# Patient Record
Sex: Female | Born: 1945 | Race: White | Hispanic: No | Marital: Married | Smoking: Never smoker
Health system: Southern US, Community
[De-identification: ages and names within clinical notes are randomized; demographics above are authoritative.]

## PROBLEM LIST (undated history)

## (undated) DIAGNOSIS — I493 Ventricular premature depolarization: Secondary | ICD-10-CM

## (undated) DIAGNOSIS — M1990 Unspecified osteoarthritis, unspecified site: Secondary | ICD-10-CM

## (undated) DIAGNOSIS — E785 Hyperlipidemia, unspecified: Secondary | ICD-10-CM

## (undated) DIAGNOSIS — I10 Essential (primary) hypertension: Secondary | ICD-10-CM

## (undated) DIAGNOSIS — K222 Esophageal obstruction: Secondary | ICD-10-CM

## (undated) DIAGNOSIS — I471 Supraventricular tachycardia, unspecified: Secondary | ICD-10-CM

## (undated) DIAGNOSIS — K5730 Diverticulosis of large intestine without perforation or abscess without bleeding: Secondary | ICD-10-CM

## (undated) DIAGNOSIS — C50919 Malignant neoplasm of unspecified site of unspecified female breast: Secondary | ICD-10-CM

## (undated) DIAGNOSIS — K219 Gastro-esophageal reflux disease without esophagitis: Secondary | ICD-10-CM

## (undated) DIAGNOSIS — K635 Polyp of colon: Secondary | ICD-10-CM

## (undated) DIAGNOSIS — Q159 Congenital malformation of eye, unspecified: Secondary | ICD-10-CM

## (undated) DIAGNOSIS — Z8719 Personal history of other diseases of the digestive system: Secondary | ICD-10-CM

## (undated) DIAGNOSIS — IMO0001 Reserved for inherently not codable concepts without codable children: Secondary | ICD-10-CM

## (undated) HISTORY — DX: Supraventricular tachycardia: I47.1

## (undated) HISTORY — DX: Diverticulosis of large intestine without perforation or abscess without bleeding: K57.30

## (undated) HISTORY — PX: COLONOSCOPY: SHX174

## (undated) HISTORY — PX: ANKLE SURGERY: SHX546

## (undated) HISTORY — DX: Personal history of other diseases of the digestive system: Z87.19

## (undated) HISTORY — DX: Congenital malformation of eye, unspecified: Q15.9

## (undated) HISTORY — DX: Hyperlipidemia, unspecified: E78.5

## (undated) HISTORY — DX: Unspecified osteoarthritis, unspecified site: M19.90

## (undated) HISTORY — DX: Esophageal obstruction: K22.2

## (undated) HISTORY — DX: Polyp of colon: K63.5

## (undated) HISTORY — DX: Ventricular premature depolarization: I49.3

## (undated) HISTORY — PX: UPPER GASTROINTESTINAL ENDOSCOPY: SHX188

## (undated) HISTORY — PX: TONSILLECTOMY: SUR1361

## (undated) HISTORY — DX: Reserved for inherently not codable concepts without codable children: IMO0001

## (undated) HISTORY — DX: Supraventricular tachycardia, unspecified: I47.10

## (undated) HISTORY — DX: Essential (primary) hypertension: I10

## (undated) HISTORY — PX: OTHER SURGICAL HISTORY: SHX169

## (undated) HISTORY — PX: KNEE ARTHROSCOPY: SUR90

## (undated) HISTORY — DX: Malignant neoplasm of unspecified site of unspecified female breast: C50.919

## (undated) HISTORY — DX: Gastro-esophageal reflux disease without esophagitis: K21.9

---

## 1997-05-27 DIAGNOSIS — E785 Hyperlipidemia, unspecified: Secondary | ICD-10-CM

## 1997-05-27 HISTORY — DX: Hyperlipidemia, unspecified: E78.5

## 1997-08-15 ENCOUNTER — Ambulatory Visit (HOSPITAL_COMMUNITY): Admission: RE | Admit: 1997-08-15 | Discharge: 1997-08-15 | Payer: Self-pay | Admitting: Obstetrics and Gynecology

## 1998-05-27 DIAGNOSIS — IMO0001 Reserved for inherently not codable concepts without codable children: Secondary | ICD-10-CM

## 1998-05-27 HISTORY — DX: Reserved for inherently not codable concepts without codable children: IMO0001

## 1998-08-28 ENCOUNTER — Encounter: Payer: Self-pay | Admitting: Obstetrics and Gynecology

## 1998-08-28 ENCOUNTER — Ambulatory Visit (HOSPITAL_COMMUNITY): Admission: RE | Admit: 1998-08-28 | Discharge: 1998-08-28 | Payer: Self-pay | Admitting: Obstetrics and Gynecology

## 1998-12-15 ENCOUNTER — Ambulatory Visit (HOSPITAL_COMMUNITY): Admission: RE | Admit: 1998-12-15 | Discharge: 1998-12-15 | Payer: Self-pay | Admitting: Obstetrics and Gynecology

## 1998-12-15 ENCOUNTER — Encounter (INDEPENDENT_AMBULATORY_CARE_PROVIDER_SITE_OTHER): Payer: Self-pay

## 1998-12-20 ENCOUNTER — Inpatient Hospital Stay (HOSPITAL_COMMUNITY): Admission: AD | Admit: 1998-12-20 | Discharge: 1998-12-20 | Payer: Self-pay | Admitting: Obstetrics and Gynecology

## 1999-08-30 ENCOUNTER — Encounter: Payer: Self-pay | Admitting: Obstetrics and Gynecology

## 1999-08-30 ENCOUNTER — Ambulatory Visit (HOSPITAL_COMMUNITY): Admission: RE | Admit: 1999-08-30 | Discharge: 1999-08-30 | Payer: Self-pay | Admitting: Obstetrics and Gynecology

## 2000-02-01 ENCOUNTER — Encounter: Payer: Self-pay | Admitting: Gastroenterology

## 2000-09-18 ENCOUNTER — Encounter: Payer: Self-pay | Admitting: Obstetrics and Gynecology

## 2000-09-18 ENCOUNTER — Ambulatory Visit (HOSPITAL_COMMUNITY): Admission: RE | Admit: 2000-09-18 | Discharge: 2000-09-18 | Payer: Self-pay | Admitting: Obstetrics and Gynecology

## 2000-09-26 ENCOUNTER — Other Ambulatory Visit: Admission: RE | Admit: 2000-09-26 | Discharge: 2000-09-26 | Payer: Self-pay | Admitting: Gastroenterology

## 2000-09-26 ENCOUNTER — Encounter (INDEPENDENT_AMBULATORY_CARE_PROVIDER_SITE_OTHER): Payer: Self-pay | Admitting: Specialist

## 2001-09-07 ENCOUNTER — Encounter: Admission: RE | Admit: 2001-09-07 | Discharge: 2001-09-07 | Payer: Self-pay | Admitting: Obstetrics and Gynecology

## 2001-09-07 ENCOUNTER — Encounter: Payer: Self-pay | Admitting: Obstetrics and Gynecology

## 2001-09-22 ENCOUNTER — Ambulatory Visit (HOSPITAL_COMMUNITY): Admission: RE | Admit: 2001-09-22 | Discharge: 2001-09-22 | Payer: Self-pay | Admitting: Obstetrics and Gynecology

## 2001-09-22 ENCOUNTER — Encounter: Payer: Self-pay | Admitting: Obstetrics and Gynecology

## 2002-07-20 ENCOUNTER — Ambulatory Visit (HOSPITAL_COMMUNITY): Admission: RE | Admit: 2002-07-20 | Discharge: 2002-07-20 | Payer: Self-pay | Admitting: Obstetrics and Gynecology

## 2002-07-20 ENCOUNTER — Encounter: Payer: Self-pay | Admitting: Obstetrics and Gynecology

## 2002-12-13 ENCOUNTER — Encounter: Payer: Self-pay | Admitting: Obstetrics and Gynecology

## 2002-12-13 ENCOUNTER — Ambulatory Visit (HOSPITAL_COMMUNITY): Admission: RE | Admit: 2002-12-13 | Discharge: 2002-12-13 | Payer: Self-pay | Admitting: Obstetrics and Gynecology

## 2003-07-19 ENCOUNTER — Other Ambulatory Visit: Admission: RE | Admit: 2003-07-19 | Discharge: 2003-07-19 | Payer: Self-pay | Admitting: Obstetrics and Gynecology

## 2003-07-22 ENCOUNTER — Encounter: Admission: RE | Admit: 2003-07-22 | Discharge: 2003-07-22 | Payer: Self-pay | Admitting: Obstetrics and Gynecology

## 2003-12-02 ENCOUNTER — Encounter: Payer: Self-pay | Admitting: Gastroenterology

## 2003-12-20 ENCOUNTER — Ambulatory Visit (HOSPITAL_COMMUNITY): Admission: RE | Admit: 2003-12-20 | Discharge: 2003-12-20 | Payer: Self-pay | Admitting: Obstetrics and Gynecology

## 2004-07-23 ENCOUNTER — Other Ambulatory Visit: Admission: RE | Admit: 2004-07-23 | Discharge: 2004-07-23 | Payer: Self-pay | Admitting: Obstetrics and Gynecology

## 2004-08-06 ENCOUNTER — Ambulatory Visit (HOSPITAL_COMMUNITY): Admission: RE | Admit: 2004-08-06 | Discharge: 2004-08-06 | Payer: Self-pay | Admitting: Obstetrics and Gynecology

## 2005-01-02 ENCOUNTER — Ambulatory Visit: Payer: Self-pay | Admitting: Cardiology

## 2005-01-04 ENCOUNTER — Ambulatory Visit (HOSPITAL_COMMUNITY): Admission: RE | Admit: 2005-01-04 | Discharge: 2005-01-04 | Payer: Self-pay | Admitting: Obstetrics and Gynecology

## 2005-01-16 ENCOUNTER — Ambulatory Visit: Payer: Self-pay

## 2005-08-20 ENCOUNTER — Other Ambulatory Visit: Admission: RE | Admit: 2005-08-20 | Discharge: 2005-08-20 | Payer: Self-pay | Admitting: Obstetrics and Gynecology

## 2006-01-06 ENCOUNTER — Ambulatory Visit (HOSPITAL_COMMUNITY): Admission: RE | Admit: 2006-01-06 | Discharge: 2006-01-06 | Payer: Self-pay | Admitting: Obstetrics and Gynecology

## 2007-02-24 ENCOUNTER — Other Ambulatory Visit: Admission: RE | Admit: 2007-02-24 | Discharge: 2007-02-24 | Payer: Self-pay | Admitting: Obstetrics and Gynecology

## 2007-03-12 ENCOUNTER — Ambulatory Visit (HOSPITAL_COMMUNITY): Admission: RE | Admit: 2007-03-12 | Discharge: 2007-03-12 | Payer: Self-pay | Admitting: Obstetrics and Gynecology

## 2008-01-27 ENCOUNTER — Ambulatory Visit (HOSPITAL_COMMUNITY): Admission: RE | Admit: 2008-01-27 | Discharge: 2008-01-28 | Payer: Self-pay | Admitting: Orthopedic Surgery

## 2008-03-28 ENCOUNTER — Ambulatory Visit (HOSPITAL_COMMUNITY): Admission: RE | Admit: 2008-03-28 | Discharge: 2008-03-28 | Payer: Self-pay | Admitting: Obstetrics and Gynecology

## 2008-03-30 ENCOUNTER — Other Ambulatory Visit: Admission: RE | Admit: 2008-03-30 | Discharge: 2008-03-30 | Payer: Self-pay | Admitting: Obstetrics and Gynecology

## 2008-04-06 ENCOUNTER — Encounter: Admission: RE | Admit: 2008-04-06 | Discharge: 2008-04-06 | Payer: Self-pay | Admitting: Obstetrics and Gynecology

## 2009-04-12 ENCOUNTER — Ambulatory Visit (HOSPITAL_COMMUNITY): Admission: RE | Admit: 2009-04-12 | Discharge: 2009-04-12 | Payer: Self-pay | Admitting: Obstetrics and Gynecology

## 2009-04-27 ENCOUNTER — Telehealth: Payer: Self-pay | Admitting: Gastroenterology

## 2009-05-27 HISTORY — PX: COLONOSCOPY W/ POLYPECTOMY: SHX1380

## 2009-05-29 DIAGNOSIS — K5730 Diverticulosis of large intestine without perforation or abscess without bleeding: Secondary | ICD-10-CM | POA: Insufficient documentation

## 2009-05-29 DIAGNOSIS — K222 Esophageal obstruction: Secondary | ICD-10-CM

## 2009-05-29 DIAGNOSIS — K219 Gastro-esophageal reflux disease without esophagitis: Secondary | ICD-10-CM | POA: Insufficient documentation

## 2009-05-31 ENCOUNTER — Other Ambulatory Visit: Admission: RE | Admit: 2009-05-31 | Discharge: 2009-05-31 | Payer: Self-pay | Admitting: Obstetrics and Gynecology

## 2009-09-11 ENCOUNTER — Telehealth: Payer: Self-pay | Admitting: Gastroenterology

## 2009-11-02 ENCOUNTER — Encounter (INDEPENDENT_AMBULATORY_CARE_PROVIDER_SITE_OTHER): Payer: Self-pay | Admitting: *Deleted

## 2009-11-06 ENCOUNTER — Ambulatory Visit: Payer: Self-pay | Admitting: Gastroenterology

## 2010-01-02 ENCOUNTER — Ambulatory Visit: Payer: Self-pay | Admitting: Gastroenterology

## 2010-01-05 ENCOUNTER — Encounter: Payer: Self-pay | Admitting: Gastroenterology

## 2010-02-08 ENCOUNTER — Telehealth: Payer: Self-pay | Admitting: Gastroenterology

## 2010-04-12 ENCOUNTER — Ambulatory Visit: Payer: Self-pay | Admitting: Internal Medicine

## 2010-04-12 DIAGNOSIS — Z860100 Personal history of colon polyps, unspecified: Secondary | ICD-10-CM | POA: Insufficient documentation

## 2010-04-12 DIAGNOSIS — E785 Hyperlipidemia, unspecified: Secondary | ICD-10-CM | POA: Insufficient documentation

## 2010-04-12 DIAGNOSIS — I359 Nonrheumatic aortic valve disorder, unspecified: Secondary | ICD-10-CM | POA: Insufficient documentation

## 2010-04-12 LAB — CONVERTED CEMR LAB
Cholesterol, target level: 200 mg/dL
LDL Goal: 160 mg/dL

## 2010-04-17 LAB — CONVERTED CEMR LAB
AST: 16 units/L (ref 0–37)
Basophils Relative: 0.1 % (ref 0.0–3.0)
Bilirubin, Direct: 0.1 mg/dL (ref 0.0–0.3)
CO2: 23 meq/L (ref 19–32)
Calcium: 9.1 mg/dL (ref 8.4–10.5)
Chloride: 108 meq/L (ref 96–112)
Cholesterol: 180 mg/dL (ref 0–200)
Creatinine, Ser: 0.8 mg/dL (ref 0.4–1.2)
Eosinophils Absolute: 0 10*3/uL (ref 0.0–0.7)
Glucose, Bld: 94 mg/dL (ref 70–99)
Hemoglobin: 13.5 g/dL (ref 12.0–15.0)
LDL Cholesterol: 114 mg/dL — ABNORMAL HIGH (ref 0–99)
Lymphocytes Relative: 39.1 % (ref 12.0–46.0)
Lymphs Abs: 1.8 10*3/uL (ref 0.7–4.0)
MCV: 89.2 fL (ref 78.0–100.0)
Monocytes Absolute: 0.2 10*3/uL (ref 0.1–1.0)
Platelets: 209 10*3/uL (ref 150.0–400.0)
RDW: 13.3 % (ref 11.5–14.6)
TSH: 1.96 microintl units/mL (ref 0.35–5.50)
Total CHOL/HDL Ratio: 3
WBC: 4.6 10*3/uL (ref 4.5–10.5)

## 2010-05-09 ENCOUNTER — Ambulatory Visit (HOSPITAL_COMMUNITY)
Admission: RE | Admit: 2010-05-09 | Discharge: 2010-05-09 | Payer: Self-pay | Source: Home / Self Care | Attending: Obstetrics & Gynecology | Admitting: Obstetrics & Gynecology

## 2010-05-31 ENCOUNTER — Telehealth: Payer: Self-pay | Admitting: Gastroenterology

## 2010-06-17 ENCOUNTER — Encounter: Payer: Self-pay | Admitting: Obstetrics and Gynecology

## 2010-06-18 ENCOUNTER — Encounter: Payer: Self-pay | Admitting: Obstetrics and Gynecology

## 2010-06-25 ENCOUNTER — Other Ambulatory Visit: Payer: Self-pay | Admitting: Obstetrics & Gynecology

## 2010-06-26 NOTE — Procedures (Signed)
Summary: EGD   EGD  Procedure date:  12/02/2003  Findings:      Location: [facility]    EGD  Procedure date:  12/02/2003  Findings:      Location: [facility]   Patient Name: Toni [name], Toni [name]. MRN:  Procedure Procedures: Panendoscopy (EGD) CPT: 43235.    with esophageal dilation. CPT: [id number].  Personnel: Endoscopist: Vania Rea. Jarold Motto, MD.  Exam Location: Exam performed in Outpatient Clinic. Outpatient  Patient Consent: Procedure, Alternatives, Risks and Benefits discussed, consent obtained, from patient. Consent was obtained by the RN.  Indications Symptoms: Dysphagia. Chest Pain.  History  Current Medications: Patient is not currently taking Coumadin.  Pre-Exam Physical: Performed Dec 02, 2003  Cardio-pulmonary exam, Abdominal exam, Extremity exam, Mental status exam WNL.  Exam Exam Info: Maximum depth of insertion Duodenum, intended Duodenum. Patient position: on left side. Duration of exam: 15 minutes. Vocal cords visualized. Gastric retroflexion performed. Images taken. ASA Classification: I. Tolerance: excellent.  Sedation Meds: Patient assessed and found to be appropriate for moderate (conscious) sedation. Fentanyl 50 mcg. given IV. Versed 5 mg. given IV. Cetacaine Spray 2 sprays given aerosolized.  Monitoring: BP and pulse monitoring done. Oximetry used. Supplemental O2 given at 2 Liters.  Instrument(s): GIF 160. Serial [id number].   Findings - STRICTURE / STENOSIS: Distal Esophagus.  Etiology: benign due to reflux. Lumen diameter is 14 mm. ICD9: Esophageal Stricture: 530.3.  - Dilation: Distal Esophagus. Maloney dilator used, Diameter: 54 F, No Resistance, No Heme present on extraction. 1  total dilators used. Patient tolerance excellent. Outcome: successful.  - Normal: Cardia to Duodenal 2nd Portion. Not Seen: Tumor. Ulcer. Mucosal abnormality. Foreign body.   Assessment  Diagnoses: 530.3: Esophageal Stricture.  GERD.   Events  Unplanned Intervention: No unplanned interventions were required.  Plans Medication(s): Continue current medications.  Patient Education: Patient given standard instructions for: Reflux.  Disposition: After procedure patient sent to recovery. After recovery patient sent home.  Scheduling: Follow-up prn.   cc: Titus Dubin. Hopper,MD  This report was created from the original endoscopy report, which was reviewed and signed by the above listed endoscopist.

## 2010-06-26 NOTE — Progress Notes (Signed)
Summary: refill  Phone Note Call from Patient Call back at Home Phone [phone]   Caller: Patient Call For: Dr Jarold Motto Reason for Call: Refill Medication, Talk to Nurse Summary of Call: Patient needs rx refill for her Dexilant called in to [facility] in [address] and [address]. (not sure of the strengh if he still wants her at 60mg  or 30mg ) Initial call taken by: Tawni Levy,  February 08, 2010 1:13 PM  Follow-up for Phone Call        rx sent, pt aware. she got samples of Dexilant at her procedure. I advised her we always rx for the 60mg  Follow-up by: Harlow Mares CMA (AAMA),  February 08, 2010 1:29 PM    New/Updated Medications: DEXILANT 60 MG CPDR (DEXLANSOPRAZOLE) take one by mouth once daily Prescriptions: DEXILANT 60 MG CPDR (DEXLANSOPRAZOLE) take one by mouth once daily  #30 x 6   Entered by:   Harlow Mares CMA (AAMA)   Authorized by:   Mardella Layman MD [facility]   Signed by:   Harlow Mares CMA (AAMA) on 02/08/2010   Method used:   Electronically to        [facility] [address] [id number] (retail)       [address]       [place], [place]  [address]       Ph: [phone]       Fax: [phone]   RxID:   [id number]

## 2010-06-26 NOTE — Procedures (Signed)
Summary: Colon   Colonoscopy  Procedure date:  02/01/2000  Findings:      Location:  [facility].    Colonoscopy  Procedure date:  02/01/2000  Findings:      Location:  [facility].   Patient Name: Toni [name], Toni [name]. MRN:  Procedure Procedures: Colonoscopy CPT: [phone].  Personnel: Endoscopist: Vania Rea. Jarold Motto, MD.  Exam Location: Exam performed in [facility]. Outpatient  Patient Consent: Procedure, Alternatives, Risks and Benefits discussed, consent obtained, from patient.  Indications  Surveillance of: Colorectal Cancer. The patient has not had prior cancer staging. This is an initial surveillance exam.  History  Pre-Exam Physical: Performed Feb 01, 2000. Cardio-pulmonary exam, Rectal exam, HEENT exam , Abdominal exam, Extremity exam, Neurological exam WNL.  Exam Exam: Extent of exam reached: Cecum, extent intended: Cecum.  The cecum was identified by IC valve. Patient position: on left side. Duration of exam: 10 minutes. Colon retroflexion performed. Images were not taken. ASA Classification: I. Tolerance: excellent.  Monitoring: Pulse and BP monitoring, Oximetry used. Supplemental O2 given.  Colon Prep Used Golytely for colon prep. Prep results: excellent.  Fluoroscopy: Fluoroscopy was not used.  Sedation Meds: Fentanyl 75 mcg. Versed 5 mg.  Instrument(s): PFC 160L. Serial [id number]. Serial #0.  Findings - DIVERTICULOSIS: Descending Colon to Sigmoid Colon. ICD9: Diverticulosis: 562.10.   Assessment  Diagnoses: 562.10: Diverticulosis.   Events  Unplanned Interventions: No intervention was required.  Plans Patient Education: Patient given standard instructions for: Diverticulosis. Patient instructed to get routine colonoscopy every 10 years. high fiber diet.  Disposition: After procedure patient sent to recovery. After recovery patient sent home.    This report was created from the original endoscopy report, which  was reviewed and signed by the above listed endoscopist.

## 2010-06-26 NOTE — Procedures (Signed)
Summary: Upper Endoscopy  Patient: [name] Note: All result statuses are Final unless otherwise noted.  Tests: (1) Upper Endoscopy (EGD)   EGD Upper Endoscopy       DONE     [facility]     [address]     [place], [place]  [address]           ENDOSCOPY PROCEDURE REPORT           PATIENT:  Toni [name], Toni [name]  MR#:  [id number]     BIRTHDATE:  [birth year], 64 yrs. old  GENDER:  female           ENDOSCOPIST:  Vania Rea. Jarold Motto, MD, [facility]     Referred by:           PROCEDURE DATE:  01/02/2010     PROCEDURE:  EGD, diagnostic, Maloney Dilation of Esophagus     ASA CLASS:  Class I     INDICATIONS:  GERD, dysphagia           MEDICATIONS:   There was residual sedation effect present from     prior procedure., Versed 3 mg IV     TOPICAL ANESTHETIC:  Exactacain Spray           DESCRIPTION OF PROCEDURE:   After the risks benefits and     alternatives of the procedure were thoroughly explained, informed     consent was obtained.  The LB GIF-H180 [id number] endoscope was     introduced through the mouth and advanced to the second portion of     the duodenum, without limitations.  The instrument was slowly     withdrawn as the mucosa was fully examined.     <<PROCEDUREIMAGES>>           A stricture was found in the mid esophagus. BENIGN CIRCUMFERENTIAL     STRICTURE DILATED #45F MALONEY.  A hiatal hernia was found. 5 CM.     HH NOTED.NO ESOPHAGITIS.  Normal duodenal folds were noted.  The     stomach was entered and closely examined. The antrum, angularis,     and lesser curvature were well visualized, including a retroflexed     view of the cardia and fundus. The stomach wall was normally     distensable. The scope passed easily through the pylorus into the     duodenum.    Retroflexed views revealed a hiatal hernia.    The     scope was then withdrawn from the patient and the procedure     completed.           COMPLICATIONS:  None           ENDOSCOPIC IMPRESSION:  1) Stricture in the mid esophagus     2) Hiatal hernia     3) Normal duodenal folds     4) Normal stomach     5) A hiatal hernia     CHRONIC GERD AND STRICTURE.DILATION DONE.     RECOMMENDATIONS:     1) Anti-reflux regimen to be follow     2) Clear liquids until, then soft foods rest iof day. Resume     prior diet tomorrow.     DAILY PRILOSEC 20MG /DAY           REPEAT EXAM:  No           ______________________________     Vania Rea. Jarold Motto, MD, [facility]           CC:  Pecola Lawless, MD           n.     Rosalie DoctorVania Rea. Patterson at 01/02/2010 11:27 AM           Recardo Evangelist, [phone]  Note: An exclamation mark (!) indicates a result that was not dispersed into the flowsheet. Document Creation Date: 01/02/2010 11:28 AM _______________________________________________________________________  (1) Order result status: Final Collection or observation date-time: 01/02/2010 11:17 Requested date-time:  Receipt date-time:  Reported date-time:  Referring Physician:   Ordering Physician: Sheryn Bison [phone]) Specimen Source:  Source: Launa Grill Order Number: [id number] Lab site:

## 2010-06-26 NOTE — Progress Notes (Signed)
Summary: Triage  Phone Note Call from Patient   Caller: Patient Summary of Call: Pt calling.  She will be due for a colonoscppy this year.  10 yr followup.  Pt asks abour getting an EGD done at the same time.  She has refulx and has a hx of esoph stricture.  At times she feels like food hangs in esophagus.  Asking if she can sch Endo and colon. Aciphex only medication that pt is taking.   Does she need OV first? Initial call taken by: Ashok Cordia RN,  September 11, 2009 2:26 PM  Follow-up for Phone Call        CAN SCHEDULE BOTH Follow-up by: Mardella Layman MD FACG,  September 11, 2009 3:51 PM  Additional Follow-up for Phone Call Additional follow up Details #1::        Pt notified.  She will call back and sch previsit and endo/colon. Additional Follow-up by: Ashok Cordia RN,  September 11, 2009 4:00 PM

## 2010-06-26 NOTE — Assessment & Plan Note (Signed)
Summary: cpx / lab/cbs   Vital Signs:  Patient profile:   65 year old female Height:      63.5 inches Weight:      173 pounds BMI:     30.27 Temp:     98.3 degrees F oral Pulse rate:   72 / minute Resp:     14 per minute BP sitting:   120 / 72  (left arm) Cuff size:   large  Vitals Entered By: Shonna Chock CMA (April 12, 2010 8:34 AM)  CC: Re-Establish: CPX with fasting labs , Heartburn, General Medical Evaluation, Lipid Management Comments Patient stated Flu Vaccine UTD   CC:  Re-Establish: CPX with fasting labs , Heartburn, General Medical Evaluation, and Lipid Management.  History of Present Illness:         [name] is here for a physical; her only active issue is GERD.  The patient reports weight loss of 10 # with  W Ws, but denies acid reflux, sour taste in mouth, epigastric pain, chest pain, and trouble swallowing.  The patient denies the following alarm features: melena, dysphagia, hematemesis, and vomiting.  Symptoms are worse with spicy foods.  Prior evaluation has included EGD with esophageal dilation in 12/2009.  The patient has found the following treatments to be effective: a PPI( Aciphex).    Lipid Management History:      Positive NCEP/ATP III risk factors include female age 63 years old or older.  Negative NCEP/ATP III risk factors include no history of early menopause without estrogen hormone replacement, non-diabetic, no family history for ischemic heart disease, non-tobacco-user status, non-hypertensive, no ASHD (atherosclerotic heart disease), no prior stroke/TIA, no peripheral vascular disease, and no history of aortic aneurysm.     Preventive Screening-Counseling & Management  Alcohol-Tobacco     Smoking Status: never  Caffeine-Diet-Exercise     Does Patient Exercise: yes  Current Medications (verified): 1)  Aciphex 20 Mg Tbec (Rabeprazole Sodium) .Marland Kitchen.. 1 By Mouth Two Times A Day  Allergies (verified): 1)  ! Dexilant (Dexlansoprazole) 2)  !  Prevacid (Lansoprazole) 3)  ! Prilosec (Omeprazole)  Past History:  Past Medical History: Mid Aortic Sclerosis,PMH  OF  by 2 D ECHO  2000  ESOPHAGEAL STRICTURE (ICD-530.3) X 2 GERD (ICD-530.81) DIVERTICULOSIS, COLON (ICD-562.10) Hyperlipidemia: Framingham Study LDL goal < 160 Colonic polyps, hx of 2011  Past Surgical History:  EGD ; esophageal dilation  X 2, 2005 & 2011 Dr Jarold Motto ; G 2 P 2; Ovarian wedge resection; Knee Arthroscopy bilateraly; Ankle surgery for tendon tears Colon polypectomy 2011, due 2016 Tonsillectomy  Family History: Father: CVA Mother: arthritis,HTN,hypothyroidism Siblings: sister: hypothyroidism, MI @65 , Osteopenia, ovarian cancer, hypertrophic Cardiomyopathy; PGM: cancer ? pimary; MGM : DM  Social History: Occupation:Secretary Married Never Smoked Alcohol use-yes: socially  Regular exercise-yes: walking 3X/week Smoking Status:  never Does Patient Exercise:  yes  Review of Systems  The patient denies anorexia, fever, vision loss, decreased hearing, hoarseness, chest pain, syncope, dyspnea on exertion, peripheral edema, prolonged cough, headaches, hemoptysis, hematuria, incontinence, suspicious skin lesions, depression, unusual weight change, abnormal bleeding, enlarged lymph nodes, and angioedema.    Physical Exam  General:  well-nourished;alert,appropriate and cooperative throughout examination Head:  Normocephalic and atraumatic without obvious abnormalities. Eyes:  No corneal or conjunctival inflammation noted.  Perrla. Funduscopic exam benign, without hemorrhages, exudates or papilledema. Ears:  External ear exam shows no significant lesions or deformities.  Otoscopic examination reveals clear canals, tympanic membranes are intact bilaterally without bulging, retraction, inflammation or discharge.  Hearing is grossly normal bilaterally. Nose:  External nasal examination shows no deformity or inflammation. Nasal mucosa are pink and moist without  lesions or exudates. Mouth:  Oral mucosa and oropharynx without lesions or exudates.  Teeth in good repair. Neck:  No deformities, masses, or tenderness noted. Lungs:  Normal respiratory effort, chest expands symmetrically. Lungs are clear to auscultation, no crackles or wheezes. Heart:  normal rate, regular rhythm, no gallop, no rub, no JVD, no HJR, and grade1/2  /6 systolic murmur.   Abdomen:  Bowel sounds positive,abdomen soft and non-tender without masses, organomegaly or hernias noted. Genitalia:  Dr Seymour Bars Msk:  Slight thoracic lordosis Pulses:  R and L carotid,radial,dorsalis pedis and posterior tibial pulses are full and equal bilaterally Extremities:  No clubbing, cyanosis, edema, or deformity noted with normal full range of motion of all joints.  Crepitus of knees , L > R Neurologic:  alert & oriented X3 and DTRs symmetrical and normal.   Skin:  Intact without suspicious lesions or rashes Cervical Nodes:  No lymphadenopathy noted Axillary Nodes:  No palpable lymphadenopathy Psych:  memory intact for recent and remote, normally interactive, and good eye contact.     Impression & Recommendations:  Problem # 1:  ROUTINE GENERAL MEDICAL EXAM@HEALTH  CARE FACL (ICD-V70.0)  Orders: EKG w/ Interpretation (93000) Venipuncture (78295) TLB-Lipid Panel (80061-LIPID) TLB-BMP (Basic Metabolic Panel-BMET) (80048-METABOL) TLB-CBC Platelet - w/Differential (85025-CBCD) TLB-Hepatic/Liver Function Pnl (80076-HEPATIC) TLB-TSH (Thyroid Stimulating Hormone) (84443-TSH)  Problem # 2:  GERD (ICD-530.81)  Her updated medication list for this problem includes:    Aciphex 20 Mg Tbec (Rabeprazole sodium) .Marland Kitchen... 1 by mouth two times a day  Problem # 3:  HYPERLIPIDEMIA (ICD-272.4)  Complete Medication List: 1)  Aciphex 20 Mg Tbec (Rabeprazole sodium) .Marland Kitchen.. 1 by mouth two times a day  Lipid Assessment/Plan:      Based on NCEP/ATP III, the patient's risk factor category is "0-1 risk factors".  The  patient's lipid goals are as follows: Total cholesterol goal is 200; LDL cholesterol goal is 160; HDL cholesterol goal is 40; Triglyceride goal is 150.    Patient Instructions: 1)  Avoid excess stimulants because of PACs. 2)  Avoid foods high in acid (tomatoes, citrus juices, spicy foods). Avoid eating within two hours of lying down or before exercising. Do not over eat; try smaller more frequent meals. Elevate head of bed twelve inches when sleeping.   Orders Added: 1)  Est. Patient 40-64 years [99396] 2)  EKG w/ Interpretation [93000] 3)  Venipuncture [36415] 4)  TLB-Lipid Panel [80061-LIPID] 5)  TLB-BMP (Basic Metabolic Panel-BMET) [80048-METABOL] 6)  TLB-CBC Platelet - w/Differential [85025-CBCD] 7)  TLB-Hepatic/Liver Function Pnl [80076-HEPATIC] 8)  TLB-TSH (Thyroid Stimulating Hormone) [62130-QMV]   Immunization History:  Tetanus/Td Immunization History:    Tetanus/Td:  historical (05/27/2001)   Immunization History:  Tetanus/Td Immunization History:    Tetanus/Td:  Historical (05/27/2001)   Appended Document: cpx / lab/cbs   Immunizations Administered:  Zostavax # 1:    Vaccine Type: Zostavax    Site: Right Arm    Mfr: Merck    Dose: 0.18mL    Route: IM    Given by: Shonna Chock CMA    Exp. Date: 01/12/2011    Lot #: [id number]    VIS given: 03/08/05 given April 12, 2010.   Appended Document: cpx / lab/cbs

## 2010-06-26 NOTE — Letter (Signed)
Summary: [facility] Instructions  [facility]  [address] [place], [place] [address]   Phone: [phone]  Fax: [phone]       Toni [name]    12-Dec-1945    MRN: [id number]        Procedure Day Dorna Bloom: Wednesday 11/29/2009     Arrival Time: 12:30 pm     Procedure Time: 1:30 pm     Location of Procedure:                    _x _  [facility] (4th Floor)   PREPARATION FOR COLONOSCOPY WITH MOVIPREP   Starting 5 days prior to your procedure Friday 7/1 do not eat nuts, seeds, popcorn, corn, beans, peas,  salads, or any raw vegetables.  Do not take any fiber supplements (e.g. Metamucil, Citrucel, and Benefiber).  THE DAY BEFORE YOUR PROCEDURE         DATE: Tuesday 7/5  1.  Drink clear liquids the entire day-NO SOLID FOOD  2.  Do not drink anything colored red or purple.  Avoid juices with pulp.  No orange juice.  3.  Drink at least 64 oz. (8 glasses) of fluid/clear liquids during the day to prevent dehydration and help the prep work efficiently.  CLEAR LIQUIDS INCLUDE: Water Jello Ice Popsicles Tea (sugar ok, no milk/cream) Powdered fruit flavored drinks Coffee (sugar ok, no milk/cream) Gatorade Juice: apple, white grape, white cranberry  Lemonade Clear bullion, consomm, broth Carbonated beverages (any kind) Strained chicken noodle soup Hard Candy                             4.  In the morning, mix first dose of MoviPrep solution:    Empty 1 Pouch A and 1 Pouch B into the disposable container    Add lukewarm drinking water to the top line of the container. Mix to dissolve    Refrigerate (mixed solution should be used within 24 hrs)  5.  Begin drinking the prep at 5:00 p.m. The MoviPrep container is divided by 4 marks.   Every 15 minutes drink the solution down to the next mark (approximately 8 oz) until the full liter is complete.   6.  Follow completed prep with 16 oz of clear liquid of your choice (Nothing red or purple).   Continue to drink clear liquids until bedtime.  7.  Before going to bed, mix second dose of MoviPrep solution:    Empty 1 Pouch A and 1 Pouch B into the disposable container    Add lukewarm drinking water to the top line of the container. Mix to dissolve    Refrigerate  THE DAY OF YOUR PROCEDURE      DATE: Wednesday 6/29  Beginning at 8:30 a.m. (5 hours before procedure):         1. Every 15 minutes, drink the solution down to the next mark (approx 8 oz) until the full liter is complete.  2. Follow completed prep with 16 oz. of clear liquid of your choice.    3. You may drink clear liquids until 11:30 am (2 HOURS BEFORE PROCEDURE).   MEDICATION INSTRUCTIONS  Unless otherwise instructed, you should take regular prescription medications with a small sip of water   as early as possible the morning of your procedure.         OTHER INSTRUCTIONS  You will need a responsible adult at least 63 years  of age to accompany you and drive you home.   This person must remain in the waiting room during your procedure.  Wear loose fitting clothing that is easily removed.  Leave jewelry and other valuables at home.  However, you may wish to bring a book to read or  an iPod/MP3 player to listen to music as you wait for your procedure to start.  Remove all body piercing jewelry and leave at home.  Total time from sign-in until discharge is approximately 2-3 hours.  You should go home directly after your procedure and rest.  You can resume normal activities the  day after your procedure.  The day of your procedure you should not:   Drive   Make legal decisions   Operate machinery   Drink alcohol   Return to work  You will receive specific instructions about eating, activities and medications before you leave.    The above instructions have been reviewed and explained to me by   Ezra Sites RN  November 06, 2009 2:26 PM     I fully understand and can verbalize these  instructions _____________________________ Date _________

## 2010-06-26 NOTE — Procedures (Signed)
Summary: Colonoscopy  Patient: Shaylynn Nulty Note: All result statuses are Final unless otherwise noted.  Tests: (1) Colonoscopy (COL)   COL Colonoscopy           DONE     [facility]     [address]     [place], [place]  [address]           COLONOSCOPY PROCEDURE REPORT           PATIENT:  Chavy, Avera  MR#:  [id number]     BIRTHDATE:  1945-11-11, 64 yrs. old  GENDER:  female     ENDOSCOPIST:  Vania Rea. Jarold Motto, MD, [facility]     REF. BY:     PROCEDURE DATE:  01/02/2010     PROCEDURE:  Colonoscopy with snare polypectomy     ASA CLASS:  Class I     INDICATIONS:  Routine Risk Screening     MEDICATIONS:   Fentanyl 75 mcg IV, Versed 7 mg IV           DESCRIPTION OF PROCEDURE:   After the risks benefits and     alternatives of the procedure were thoroughly explained, informed     consent was obtained.  Digital rectal exam was performed and     revealed no abnormalities.   The [id number] [id number] endoscope was     introduced through the anus and advanced to the cecum, which was     identified by both the appendix and ileocecal valve, without     limitations.  The quality of the prep was excellent, using     MoviPrep.  The instrument was then slowly withdrawn as the colon     was fully examined.     <<PROCEDUREIMAGES>>           FINDINGS:  Moderate diverticulosis was found in the sigmoid to     descending colon segments.  A sessile polyp was found in the     descending colon. FLAT POLYP COLD SNARE EXCISED   Retroflexed     views in the rectum revealed no abnormalities.    The scope was     then withdrawn from the patient and the procedure completed.           COMPLICATIONS:  None     ENDOSCOPIC IMPRESSION:     1) Moderate diverticulosis in the sigmoid to descending colon     segments     2) Sessile polyp in the descending colon     R/O ADENOMA     RECOMMENDATIONS:     1) Repeat colonoscopy in 5 years if polyp adenomatous; otherwise     10 years     2) High fiber  diet.     REPEAT EXAM:  No           ______________________________     Vania Rea. Jarold Motto, MD, [facility]           CC:  Pecola Lawless, MD           n.     Rosalie DoctorMarland Kitchen   Vania Rea. Patterson at 01/02/2010 11:12 AM           Recardo Evangelist, [id number]  Note: An exclamation mark (!) indicates a result that was not dispersed into the flowsheet. Document Creation Date: 01/02/2010 11:14 AM _______________________________________________________________________  (1) Order result status: Final Collection or observation date-time: 01/02/2010 11:05 Requested date-time:  Receipt date-time:  Reported date-time:  Referring Physician:   Ordering Physician:  Sheryn Bison [phone]) Specimen Source:  Source: Launa Grill Order Number: [id number] Lab site:   Appended Document: Colonoscopy     Procedures Next Due Date:    Colonoscopy: 12/2014

## 2010-06-26 NOTE — Letter (Signed)
Summary: Patient Notice- Polyp Results  [facility]  [address] [place], [place] [address]   Phone: [phone]  Fax: [phone]        January 05, 2010 MRN: [id number]    [facility] [address] [place], [place]  [address]    Dear Toni [name],  I am pleased to inform you that the colon polyp(s) removed during your recent colonoscopy was (were) found to be benign (no cancer detected) upon pathologic examination.  I recommend you have a repeat colonoscopy examination in 5_ years to look for recurrent polyps, as having colon polyps increases your risk for having recurrent polyps or even colon cancer in the future.  Should you develop new or worsening symptoms of abdominal pain, bowel habit changes or bleeding from the rectum or bowels, please schedule an evaluation with either your primary care physician or with me.  Additional information/recommendations:  _X_ No further action with gastroenterology is needed at this time. Please      follow-up with your primary care physician for your other healthcare      needs.  __ Please call [phone] to schedule a return visit to review your      situation.  __ Please keep your follow-up visit as already scheduled.  __ Continue treatment plan as outlined the day of your exam.  Please call us if you are having persistent problems or have questions about your condition that have not been fully answered at this time.  Sincerely,  Toni Layman MD [facility]  This letter has been electronically signed by your physician.  Appended Document: Patient Notice- Polyp Results letter mailed

## 2010-06-26 NOTE — Miscellaneous (Signed)
Summary: [facility] PV  Clinical Lists Changes  Medications: Added new medication of MOVIPREP 100 GM  SOLR (PEG-KCL-NACL-NASULF-NA ASC-C) As per prep instructions. - Signed Rx of MOVIPREP 100 GM  SOLR (PEG-KCL-NACL-NASULF-NA ASC-C) As per prep instructions.;  #1 x 0;  Signed;  Entered by: Ezra Sites RN;  Authorized by: Mardella Layman MD [facility]  Method used: Electronically to [facility] [id number]*, [address] [place], [place], [place]  [address], Ph: [phone], Fax: [phone] Observations: Added new observation of NKA: T (11/06/2009 13:37)    Prescriptions: MOVIPREP 100 GM  SOLR (PEG-KCL-NACL-NASULF-NA ASC-C) As per prep instructions.  #1 x 0   Entered by:   Ezra Sites RN   Authorized by:   Mardella Layman MD [facility]   Signed by:   Ezra Sites RN on 11/06/2009   Method used:   Electronically to        [facility] [address] [id number] (retail)       [address]       [place], [place]  [address]       Ph: [phone]       Fax: [phone]   RxID:   [id number]

## 2010-06-28 NOTE — Progress Notes (Signed)
Summary: Medication  Phone Note Call from Patient Call back at Home Phone [phone]   Caller: Patient Call For: Dr. Jarold Motto Reason for Call: Talk to Nurse Summary of Call: Pt is on dexilant, it is causing pressure in her ears, she would like to go back on aciphex, she use [facility] [address] [phone] Initial call taken by: [place] Johnson,  May 31, 2010 8:35 AM  Follow-up for Phone Call        Notified patient I ordered the Aciphex. Dexilant is now listed under patient's allergies by Dr Frederik Pear office. Follow-up by: Graciella Freer RN,  May 31, 2010 9:20 AM    Prescriptions: ACIPHEX 20 MG TBEC (RABEPRAZOLE SODIUM) 1 by mouth two times a day  #60 x 2   Entered by:   Graciella Freer RN   Authorized by:   Mardella Layman MD [facility]   Signed by:   Graciella Freer RN on 05/31/2010   Method used:   Electronically to        [facility] [id number]* (retail)       [address]       [place], [place]  [address]       Ph: [phone]       Fax: [phone]   RxID:   [id number]

## 2010-09-23 ENCOUNTER — Other Ambulatory Visit: Payer: Self-pay | Admitting: Gastroenterology

## 2010-10-09 NOTE — Op Note (Signed)
Toni [name], Toni [name]              ACCOUNT NO.:  [id number]   MEDICAL RECORD NO.:  [id number]          PATIENT TYPE:  AMB   LOCATION:  DAY                          FACILITY:  [facility]   PHYSICIAN:  Marlowe Kays, M.D.  DATE OF BIRTH:  1945-12-03   DATE OF PROCEDURE:  01/27/2008  DATE OF DISCHARGE:                               OPERATIVE REPORT   PREOPERATIVE DIAGNOSIS:  Torn peroneus brevis tendon, right ankle.   POSTOPERATIVE DIAGNOSIS:  Torn peroneus brevis tendon, right ankle.   OPERATION:  Repair of torn peroneus brevis tendon, right ankle.   SURGEON:  Marlowe Kays, M.D.   ASSISTANT:  Nurse.   ANESTHESIA:  General.   JUSTIFICATION FOR PROCEDURE:  She injured her ankle in March pushing  some boxes.  Because of continued pain, an MRI was performed on November 24, 2007 which demonstrated a longitudinal tear of the peroneus brevis  tendon with some sub tendon units -- which was primarily around the  distal fibula.  She is here at this time for repair.   At surgery there was a 4-cm tear extending from the muscle tendon  junction distalward.  The tear was quite comminuted in terms of multiple  strands and layers to the tear.   PROCEDURE:  Under satisfactory general anesthesia with a bump underneath  the right hip, the pneumatic tourniquet was inflated to 325 mmHg with  the right leg Esmarched out non sterilely; and prepped from midcalf to  toes with DuraPrep and draped in a sterile field.  I marked out a curved  incision from the distal lateral calf extending distalward down to just  shy of the base of the fifth metatarsal.  The peroneal retinaculum was  opened along with the peroneal tendon sheath.  The complex tear  identified, extending as noted from the muscle tendon junction  distalward for roughly 4 cm.  Working distally, I repaired the tendon  with a combination of running and interrupted 2-0 Ethibond suture,  trying to incorporate all of the sub tendon units.  The final  repair, I  felt, was quite sturdy.  The wound was irrigated well with saline, and  the peroneus brevis tendon replaced in this position deep to the longus  and beneath the fibula.  The SPR was reapproximated with interrupted 2-0  Vicryl, as was the rest the peroneal tendon sheath.  The subcutaneous  tissue was closed with a combination of 2-0 and 3-0 Vicryl, running 4-0  nylon in the skin and subcutaneous tissue.  Betadine Adaptic dry sterile  dressing and a well-padded short-leg splint cast was applied.  The  tourniquet was released.  She tolerated the procedure well.  At time of  this dictation she was on her way to the recovery room in satisfactory  condition with no known complications.           ______________________________  Marlowe Kays, M.D.    JA/MEDQ  D:  01/27/2008  T:  01/28/2008  Job:  [id number]

## 2010-10-11 ENCOUNTER — Other Ambulatory Visit: Payer: Self-pay | Admitting: Gastroenterology

## 2010-10-24 ENCOUNTER — Other Ambulatory Visit: Payer: Self-pay | Admitting: Gastroenterology

## 2011-01-08 ENCOUNTER — Other Ambulatory Visit: Payer: Self-pay | Admitting: Gastroenterology

## 2011-01-08 MED ORDER — RABEPRAZOLE SODIUM 20 MG PO TBEC: 20.0000 mg | DELAYED_RELEASE_TABLET | Freq: Two times a day (BID) | ORAL | Status: DC

## 2011-01-08 NOTE — Telephone Encounter (Signed)
Pt aware if she doesn't make an office visit she will no longer get refills. She states that she will make an office visit in a week or so

## 2011-02-05 ENCOUNTER — Encounter: Payer: Self-pay | Admitting: Gastroenterology

## 2011-02-05 NOTE — Telephone Encounter (Signed)
Error

## 2011-02-27 LAB — HEMOGLOBIN AND HEMATOCRIT, BLOOD
HCT: 39.3
Hemoglobin: 13.3

## 2011-03-05 ENCOUNTER — Ambulatory Visit (INDEPENDENT_AMBULATORY_CARE_PROVIDER_SITE_OTHER): Payer: Managed Care, Other (non HMO) | Admitting: Gastroenterology

## 2011-03-05 ENCOUNTER — Encounter: Payer: Self-pay | Admitting: Gastroenterology

## 2011-03-05 VITALS — BP 120/72 | HR 68 | Ht 63.0 in | Wt 176.0 lb

## 2011-03-05 DIAGNOSIS — K219 Gastro-esophageal reflux disease without esophagitis: Secondary | ICD-10-CM

## 2011-03-05 MED ORDER — RABEPRAZOLE SODIUM 20 MG PO TBEC: 20.0000 mg | DELAYED_RELEASE_TABLET | Freq: Two times a day (BID) | ORAL | Status: DC

## 2011-03-05 MED ORDER — METOCLOPRAMIDE HCL 10 MG PO TABS: 10.0000 mg | ORAL_TABLET | Freq: Every day | ORAL | Status: DC

## 2011-03-05 NOTE — Progress Notes (Signed)
History of Present Illness: This is a very nice 65 year old Caucasian female with chronic GERD. She had dilation of a peptic stricture at the time of endoscopy one year ago. Previous 24-hour pH probe testing confirmed rather severe acid reflux but daytime and nighttime. Currently she is on AcipHex 20 mg twice a day but continues with nocturnal breakthrough symptoms. She specifically denies dysphagia or lower gastrointestinal or hepatobiliary complaints. In the past she has had reactions to several PPI medications. Her health is otherwise excellent, and she is only on calcium and aspirin. Previous endoscopic reports and chart reviewed today.    Current Medications, Allergies, Past Medical History, Past Surgical History, Family History and Social History were reviewed in [employer] record.   Assessment and plan: I have renewed her AcipHex 20 mg twice a day, and reviewed acid reflux maneuvers and regime. I have added Reglan 10 mg at bedtime as a therapeutic trial. If this does not help, and she continues with nocturnal strangling, then I would recommend a sleep study to exclude sleep apnea. Encounter Diagnosis  Name Primary?  . GERD (gastroesophageal reflux disease) Yes

## 2011-03-05 NOTE — Patient Instructions (Signed)
Your aciphex has been refilled. Your prescription for Reglan has been sent to your pharmacy. Call with an update on your condition.

## 2011-04-02 ENCOUNTER — Other Ambulatory Visit (HOSPITAL_COMMUNITY): Payer: Self-pay | Admitting: Obstetrics & Gynecology

## 2011-04-02 DIAGNOSIS — Z1231 Encounter for screening mammogram for malignant neoplasm of breast: Secondary | ICD-10-CM

## 2011-05-13 ENCOUNTER — Ambulatory Visit (HOSPITAL_COMMUNITY)
Admission: RE | Admit: 2011-05-13 | Discharge: 2011-05-13 | Disposition: A | Discharge status: Home / Self Care | Payer: Managed Care, Other (non HMO) | Source: Ambulatory Visit | Attending: Obstetrics & Gynecology | Admitting: Obstetrics & Gynecology

## 2011-05-13 DIAGNOSIS — Z1231 Encounter for screening mammogram for malignant neoplasm of breast: Secondary | ICD-10-CM | POA: Insufficient documentation

## 2011-05-14 ENCOUNTER — Ambulatory Visit (INDEPENDENT_AMBULATORY_CARE_PROVIDER_SITE_OTHER): Payer: Managed Care, Other (non HMO) | Admitting: Internal Medicine

## 2011-05-14 ENCOUNTER — Encounter: Payer: Self-pay | Admitting: Internal Medicine

## 2011-05-14 VITALS — BP 126/84 | HR 76 | Temp 98.5°F | Resp 12 | Ht 66.0 in | Wt 177.6 lb

## 2011-05-14 DIAGNOSIS — E785 Hyperlipidemia, unspecified: Secondary | ICD-10-CM

## 2011-05-14 DIAGNOSIS — Z0000 Encounter for general adult medical examination without abnormal findings: Secondary | ICD-10-CM

## 2011-05-14 NOTE — Assessment & Plan Note (Signed)
Her LDL has been as high as 158. HDL has been in protective range. The hyperlipidemia has resolved with nutritional intervention. Her sister had a myocardial infarction age 63 but was a smoker.

## 2011-05-14 NOTE — Patient Instructions (Addendum)
Preventive Health Care: Exercise  30-45  minutes a day, 3-4 days a week. Walking is especially valuable in preventing Osteoporosis. Eat a low-fat diet with lots of fruits and vegetables, up to 7-9 servings per day. Consume less than 30 grams of sugar per day from foods & drinks with High Fructose Corn Syrup as #2,3 or #4 on label. Eye Doctor - have an eye exam @ least annually Health Care Power of Attorney & Living Will place you in charge of your health care  decisions. Verify these are  in place. The triggers for reflux  include stress; the "aspirin family" ; alcohol; peppermint; and caffeine (coffee, tea, cola, and chocolate). The aspirin family would include aspirin and the nonsteroidal agents such as ibuprofen &  Naproxen. Tylenol would not cause reflux. If having reflux ; food & drink should be avoided for @ least 2 hours before going to bed.  To prevent palpitations or premature beats, avoid stimulants such as decongestants, diet pills, nicotine, or caffeine (coffee, tea, cola, or chocolate) to excess.

## 2011-05-14 NOTE — Progress Notes (Signed)
Subjective:    Patient ID: Toni [name], female    DOB: 1945-12-27, 65 y.o.   MRN: [id number]  HPI  Toni [name]  is here for a physical; she denies acute issues.     Review of Systems Patient reports no vision/ hearing  changes, adenopathy,fever, weight change,  persistant / recurrent hoarseness , swallowing issues, chest pain,palpitations,edema,persistant /recurrent cough, hemoptysis, dyspnea( rest/ exertional/paroxysmal nocturnal), gastrointestinal bleeding(melena, rectal bleeding), abdominal pain, significant heartburn,  bowel changes,GU symptoms(dysuria, hematuria,pyuria, incontinence), Gyn symptoms(abnormal  bleeding , pain),  syncope, focal weakness, memory loss,numbness & tingling, skin/hair /nail changes,abnormal bruising or bleeding, anxiety,or depression.      Objective:   Physical Exam Gen.: Healthy and well-nourished in appearance. Alert, appropriate and cooperative throughout exam. Head: Normocephalic without obvious abnormalities  Eyes: No corneal or conjunctival inflammation noted. Pupils equal round reactive to light and accommodation. Fundal exam is benign without hemorrhages, exudate, papilledema. Extraocular motion intact. Vision grossly decreased OD. Ears: External  ear exam reveals no significant lesions or deformities. Canals clear .TMs normal. Hearing is grossly normal bilaterally. Nose: External nasal exam reveals no deformity or inflammation. Nasal mucosa are pink and moist. No lesions or exudates noted.  Mouth: Oral mucosa and oropharynx reveal no lesions or exudates. Teeth in good repair. Neck: No deformities, masses, or tenderness noted. Range of motion &  Thyroid normal. Lungs: Normal respiratory effort; chest expands symmetrically. Lungs are clear to auscultation without rales, wheezes, or increased work of breathing. Heart: Normal rate and rhythm. Normal S1 and S2. No gallop, click, or rub. R base Grade 1/6 systolic murmur. Abdomen: Bowel sounds normal;  abdomen soft and nontender. No masses, organomegaly or hernias noted. Genitalia: Dr Seymour Bars, [place] Bibles   .                                                                                   Musculoskeletal/extremities: No deformity or scoliosis noted of  the thoracic or lumbar spine. No clubbing, cyanosis, edema, or deformity noted. Range of motion  normal .Tone & strength  normal.Joints normal. Nail health  Good. Crepitus of nails Vascular: Carotid, radial artery, dorsalis pedis and  posterior tibial pulses are full and equal. No bruits present. Neurologic: Alert and oriented x3. Deep tendon reflexes symmetrical and normal.          Skin: Intact without suspicious lesions or rashes. Deeply tanned Lymph: No cervical, axillary lymphadenopathy present. Psych: Mood and affect are normal. Normally interactive                                                                                         Assessment & Plan:  #1 comprehensive physical exam; no acute findings #2 see Problem List with Assessments & Recommendations  #3 EKG reveals unifocal PVCs which are asymptomatic. She has no ischemic changes. Plan: see Orders

## 2011-05-15 LAB — CBC WITH DIFFERENTIAL/PLATELET
Basophils Absolute: 0 10*3/uL (ref 0.0–0.1)
Eosinophils Absolute: 0.1 10*3/uL (ref 0.0–0.7)
Hemoglobin: 13.8 g/dL (ref 12.0–15.0)
Lymphocytes Relative: 39.6 % (ref 12.0–46.0)
MCHC: 34 g/dL (ref 30.0–36.0)
MCV: 88.6 fl (ref 78.0–100.0)
Monocytes Absolute: 0.2 10*3/uL (ref 0.1–1.0)
Neutro Abs: 3.2 10*3/uL (ref 1.4–7.7)
RDW: 13.3 % (ref 11.5–14.6)

## 2011-05-15 LAB — BASIC METABOLIC PANEL
CO2: 27 mEq/L (ref 19–32)
Calcium: 9.2 mg/dL (ref 8.4–10.5)
Chloride: 109 mEq/L (ref 96–112)
Creatinine, Ser: 0.7 mg/dL (ref 0.4–1.2)
Glucose, Bld: 87 mg/dL (ref 70–99)
Sodium: 144 mEq/L (ref 135–145)

## 2011-05-15 LAB — LIPID PANEL
Cholesterol: 202 mg/dL — ABNORMAL HIGH (ref 0–200)
HDL: 64.4 mg/dL (ref >39.00)
Triglycerides: 64 mg/dL (ref 0.0–149.0)
VLDL: 12.8 mg/dL (ref 0.0–40.0)

## 2011-05-15 LAB — HEPATIC FUNCTION PANEL
Albumin: 4.2 g/dL (ref 3.5–5.2)
Alkaline Phosphatase: 71 U/L (ref 39–117)
Total Protein: 6.9 g/dL (ref 6.0–8.3)

## 2011-05-15 LAB — LDL CHOLESTEROL, DIRECT: Direct LDL: 122.3 mg/dL

## 2011-07-21 ENCOUNTER — Other Ambulatory Visit: Payer: Self-pay

## 2011-07-21 ENCOUNTER — Emergency Department (HOSPITAL_COMMUNITY)
Admission: EM | Admit: 2011-07-21 | Discharge: 2011-07-21 | Disposition: A | Discharge status: Home / Self Care | Payer: Managed Care, Other (non HMO) | Attending: Emergency Medicine | Admitting: Emergency Medicine

## 2011-07-21 ENCOUNTER — Encounter (HOSPITAL_COMMUNITY): Payer: Self-pay | Admitting: Emergency Medicine

## 2011-07-21 ENCOUNTER — Emergency Department (HOSPITAL_COMMUNITY): Payer: Managed Care, Other (non HMO)

## 2011-07-21 DIAGNOSIS — I471 Supraventricular tachycardia: Secondary | ICD-10-CM

## 2011-07-21 DIAGNOSIS — I498 Other specified cardiac arrhythmias: Secondary | ICD-10-CM | POA: Insufficient documentation

## 2011-07-21 DIAGNOSIS — E785 Hyperlipidemia, unspecified: Secondary | ICD-10-CM | POA: Insufficient documentation

## 2011-07-21 DIAGNOSIS — R002 Palpitations: Secondary | ICD-10-CM | POA: Insufficient documentation

## 2011-07-21 DIAGNOSIS — R079 Chest pain, unspecified: Secondary | ICD-10-CM | POA: Insufficient documentation

## 2011-07-21 LAB — POCT I-STAT, CHEM 8
Calcium, Ion: 1.11 mmol/L — ABNORMAL LOW (ref 1.12–1.32)
Glucose, Bld: 111 mg/dL — ABNORMAL HIGH (ref 70–99)
HCT: 37 % (ref 36.0–46.0)
TCO2: 22 mmol/L (ref 0–100)

## 2011-07-21 LAB — DIFFERENTIAL
Basophils Absolute: 0 10*3/uL (ref 0.0–0.1)
Basophils Relative: 0 % (ref 0–1)
Eosinophils Relative: 2 % (ref 0–5)
Monocytes Absolute: 0.4 10*3/uL (ref 0.1–1.0)
Monocytes Relative: 6 % (ref 3–12)

## 2011-07-21 LAB — CBC
HCT: 37.6 % (ref 36.0–46.0)
Hemoglobin: 12.9 g/dL (ref 12.0–15.0)
MCH: 29.2 pg (ref 26.0–34.0)
MCHC: 34.3 g/dL (ref 30.0–36.0)
MCV: 85.1 fL (ref 78.0–100.0)
RDW: 12.6 % (ref 11.5–15.5)

## 2011-07-21 LAB — POCT I-STAT TROPONIN I: Troponin i, poc: 0 ng/mL (ref 0.00–0.08)

## 2011-07-21 MED ORDER — ADENOSINE 6 MG/2ML IV SOLN
6.0000 mg | Freq: Once | INTRAVENOUS | Status: AC
  Administered 2011-07-21: 6 mg via INTRAVENOUS

## 2011-07-21 MED ORDER — ASPIRIN 81 MG PO CHEW
CHEWABLE_TABLET | ORAL | Status: AC
  Administered 2011-07-21: 324 mg via ORAL
  Filled 2011-07-21: qty 4

## 2011-07-21 MED ORDER — ADENOSINE 6 MG/2ML IV SOLN
INTRAVENOUS | Status: AC
  Administered 2011-07-21: 6 mg via INTRAVENOUS
  Filled 2011-07-21: qty 6

## 2011-07-21 MED ORDER — ASPIRIN 81 MG PO CHEW
324.0000 mg | CHEWABLE_TABLET | Freq: Once | ORAL | Status: AC
  Administered 2011-07-21: 324 mg via ORAL

## 2011-07-21 NOTE — ED Notes (Signed)
Pt stated that she woke up about 30 minutes with her heart racing and left side chest pain. Pain is 9 out of 10. No radiation with pain. HR currently in 180s. No SOB. No dizziness. No nausea/vomiting. This is the pts first time having CP and palpations. Pt stated that her left are and shoulder has been hurting for a couple of days. Pt stated that left arm and shoulder pain is sore. Will continue to monitor.

## 2011-07-21 NOTE — ED Notes (Signed)
Pt is alert and oriented with family at bedside.  Pt is SR in the 80s on the monitor with PVCs.  Pt is awaiting admitting MD.  Lungs clear.  No pain presently.  Will continue to monitor

## 2011-07-21 NOTE — ED Notes (Signed)
Patient complaining of palpitations that awoke her from sleep tonight; patient states that she has never had this feeling before.  Denies history of a. Fib.  Patient also complaining of a "tightness" feeling in the middle of her chest.  Denies shortness of breath, nausea/vomiting, and diaphoresis. Patient seems anxious.  HR 180's.

## 2011-07-21 NOTE — ED Provider Notes (Signed)
History     CSN: [id number]  Arrival date & time 07/21/11  0518   First MD Initiated Contact with Patient 07/21/11 0533      Chief Complaint  Patient presents with  . Palpitations    (Consider location/radiation/quality/duration/timing/severity/associated sxs/prior treatment) Patient is a 66 y.o. female presenting with palpitations. The history is provided by the patient. No language interpreter was used.  Palpitations  This is a new problem. The current episode started less than 1 hour ago. The problem occurs constantly. The problem has not changed since onset.Associated with: at rest. On average, each episode lasts 45 minutes. Associated symptoms include chest pain. Pertinent negatives include no diaphoresis, no fever, no malaise/fatigue, no numbness, no chest pressure, no claudication, no exertional chest pressure, no irregular heartbeat, no near-syncope, no orthopnea, no PND, no syncope, no abdominal pain, no nausea, no vomiting, no headaches, no back pain, no leg pain, no lower extremity edema, no dizziness, no weakness, no cough, no hemoptysis, no shortness of breath and no sputum production. She has tried nothing for the symptoms. The treatment provided no relief. Risk factors include no known risk factors.    Past Medical History  Diagnosis Date  . Aortic sclerosis 2000    on ECHO  . Stricture and stenosis of esophagus     dilation X 3  . Esophageal reflux   . Diverticulosis of colon (without mention of hemorrhage)   . Colon polyps     Dr Jarold Motto  . Hyperlipidemia 1999    LDL 158, resolved with diet  . Eye abnormality     OD ocular freckle    Past Surgical History  Procedure Date  . Ovarian wedge resection     to allow pregnancy  . Knee arthroscopy     bilateral  . Ankle surgery     right  . Tonsillectomy   . Colonoscopy w/ polypectomy 2011    due 2016    Family History  Problem Relation Age of Onset  . Stroke Father   . Arthritis Mother     OA  .  Hypertension Mother   . Hypothyroidism Mother   . Hypothyroidism Sister   . Heart disease Sister 96    MI  . Osteopenia Sister   . Ovarian cancer Sister   . Hypertrophic cardiomyopathy Sister   . Diabetes Maternal Grandmother     History  Substance Use Topics  . Smoking status: Never Smoker   . Smokeless tobacco: Never Used  . Alcohol Use: Yes     socially    OB History    Grav Para Term Preterm Abortions TAB SAB Ect Mult Living                  Review of Systems  Constitutional: Negative for fever, malaise/fatigue and diaphoresis.  HENT: Negative.   Respiratory: Negative for cough, hemoptysis, sputum production and shortness of breath.   Cardiovascular: Positive for chest pain and palpitations. Negative for orthopnea, claudication, syncope, PND and near-syncope.  Gastrointestinal: Negative.  Negative for nausea, vomiting and abdominal pain.  Genitourinary: Negative.   Musculoskeletal: Negative for back pain.  Skin: Negative.   Neurological: Negative for dizziness, weakness, numbness and headaches.  Hematological: Negative.   Psychiatric/Behavioral: Negative.     Allergies  Dexlansoprazole; Lansoprazole; and Omeprazole  Home Medications   Current Outpatient Rx  Name Route Sig Dispense Refill  . ASPIRIN 81 MG PO TABS Oral Take 81 mg by mouth daily.      Marland Kitchen CALCIUM  CITRATE PO Oral Take 1 tablet by mouth daily.     Marland Kitchen VITAMIN D 1000 UNITS PO TABS Oral Take 1,000 Units by mouth daily.      . LUTEIN PO Oral Take 1 tablet by mouth daily.      Marland Kitchen FISH OIL 1000 MG PO CAPS Oral Take 1 capsule by mouth daily.      Marland Kitchen RABEPRAZOLE SODIUM 20 MG PO TBEC Oral Take 1 tablet (20 mg total) by mouth 2 (two) times daily. 60 tablet 11    BP 125/76  Pulse 84  Temp(Src) 97.7 F (36.5 C) (Oral)  Resp 15  Ht 5\' 4"  (1.626 m)  Wt 179 lb (81.194 kg)  BMI 30.73 kg/m2  SpO2 100%  Physical Exam  Constitutional: She is oriented to person, place, and time. She appears well-developed and  well-nourished.  HENT:  Head: Normocephalic and atraumatic.  Mouth/Throat: Oropharynx is clear and moist.  Eyes: Conjunctivae are normal. Pupils are equal, round, and reactive to light.  Neck: Normal range of motion. Neck supple.  Cardiovascular: Tachycardia present.   Pulmonary/Chest: Effort normal and breath sounds normal.  Abdominal: Soft. Bowel sounds are normal.  Musculoskeletal: Normal range of motion.  Neurological: She is alert and oriented to person, place, and time.  Skin: Skin is warm and dry.  Psychiatric: She has a normal mood and affect.    ED Course  Procedures (including critical care time)  Labs Reviewed  POCT I-STAT, CHEM 8 - Abnormal; Notable for the following:    Glucose, Bld 111 (*)    Calcium, Ion 1.11 (*)    All other components within normal limits  CBC  DIFFERENTIAL  POCT I-STAT TROPONIN I   Dg Chest Port 1 View  07/21/2011  *RADIOLOGY REPORT*  Clinical Data: Chest pain, shortness of breath.  PORTABLE CHEST - 1 VIEW  Comparison: None.  Findings: Heart is borderline enlarged.  Hyperinflation of the lungs.  Left lower lobe atelectasis or infiltrate.  No effusions. No acute bony abnormality.  IMPRESSION: Hyperinflation.  Left lower lobe atelectasis or infiltrate.  Original Report Authenticated By: Cyndie Chime, M.D.     No diagnosis found.   Date: 07/21/2011  Rate:180  Rhythm: supraventricular tachycardia (SVT)  QRS Axis: normal  Intervals: normal  ST/T Wave abnormalities: normal  Conduction Disutrbances:none  Narrative Interpretation:   Old EKG Reviewed: changes noted  Post adenosine  Date: 07/21/2011  Rate: 89  Rhythm: normal sinus rhythm  QRS Axis: normal  Intervals: normal  ST/T Wave abnormalities: normal  Conduction Disutrbances:none  Narrative Interpretation:   Old EKG Reviewed: changes noted   Case d/w Dr. Elease Hashimoto call for appointment in am  MDM  Call cardiology in am to be seen tomorrow.  Return for palpitations.  Chest pain  shortness or any concerns  MDM Reviewed: vitals Interpretation: labs, ECG and x-ray Total time providing critical care: 30-74 minutes. This excludes time spent performing separately reportable procedures and services. Consults: cardiology   CRITICAL CARE Performed by: Jasmine Awe   Total critical care time: 30  Critical care time was exclusive of separately billable procedures and treating other patients.  Critical care was necessary to treat or prevent imminent or life-threatening deterioration.  Critical care was time spent personally by me on the following activities: development of treatment plan with patient and/or surrogate as well as nursing, discussions with consultants, evaluation of patient's response to treatment, examination of patient, obtaining history from patient or surrogate, ordering and performing treatments and interventions, ordering  and review of laboratory studies, ordering and review of radiographic studies, pulse oximetry and re-evaluation of patient's condition.      Jasmine Awe, MD 07/21/11 [phone]

## 2011-07-22 ENCOUNTER — Telehealth: Payer: Self-pay | Admitting: Internal Medicine

## 2011-07-22 ENCOUNTER — Telehealth: Payer: Self-pay | Admitting: Cardiovascular Disease

## 2011-07-22 NOTE — Telephone Encounter (Signed)
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 She said she really wanted to see Dr Johney Frame. Please call her back

## 2011-07-22 NOTE — Telephone Encounter (Signed)
Dr Elease Hashimoto said pt can be seen this week, app made to pts schedule.

## 2011-07-22 NOTE — Telephone Encounter (Signed)
Pt was seen in ED yesterday and Dr. Elease Hashimoto told pt to call him today and he would get her in

## 2011-07-23 ENCOUNTER — Encounter: Payer: Self-pay | Admitting: Cardiovascular Disease

## 2011-07-23 ENCOUNTER — Ambulatory Visit (INDEPENDENT_AMBULATORY_CARE_PROVIDER_SITE_OTHER): Payer: Managed Care, Other (non HMO) | Admitting: Cardiovascular Disease

## 2011-07-23 DIAGNOSIS — R0609 Other forms of dyspnea: Secondary | ICD-10-CM

## 2011-07-23 DIAGNOSIS — R002 Palpitations: Secondary | ICD-10-CM

## 2011-07-23 DIAGNOSIS — I498 Other specified cardiac arrhythmias: Secondary | ICD-10-CM

## 2011-07-23 DIAGNOSIS — I4949 Other premature depolarization: Secondary | ICD-10-CM

## 2011-07-23 DIAGNOSIS — R0989 Other specified symptoms and signs involving the circulatory and respiratory systems: Secondary | ICD-10-CM

## 2011-07-23 DIAGNOSIS — I471 Supraventricular tachycardia: Secondary | ICD-10-CM

## 2011-07-23 DIAGNOSIS — I493 Ventricular premature depolarization: Secondary | ICD-10-CM

## 2011-07-23 MED ORDER — PROPRANOLOL HCL 10 MG PO TABS: 10.0000 mg | ORAL_TABLET | Freq: Four times a day (QID) | ORAL | Status: DC | PRN

## 2011-07-23 MED ORDER — POTASSIUM CHLORIDE CRYS ER 10 MEQ PO TBCR: 10.0000 meq | EXTENDED_RELEASE_TABLET | Freq: Every day | ORAL | Status: DC

## 2011-07-23 NOTE — Patient Instructions (Addendum)
Your physician recommends that you schedule a follow-up appointment in: 3 months  Your physician recommends that you return for lab work in: 3 months ///bmet   Your physician has requested that you have en exercise stress myoview. Please follow instruction sheet, as given.  Your physician has recommended you make the following change in your medication:   START KDUR 10 MEQ DAILY Start propranolol 10 mg tablet as needed for palpitations. Take one every thirty minutes up to four doses.

## 2011-07-23 NOTE — Telephone Encounter (Signed)
SVT--she saw Dr Elease Hashimoto today and she likes him and will continue with him. Dr Elease Hashimoto assured her  if needs Dr Johney Frame will be glad to refer

## 2011-07-23 NOTE — Assessment & Plan Note (Signed)
Toni [name] presents with an episode of supraventricular tachycardia that occurred this week and. It resolved with adenosine. Her blood pressure and heart rate were a bit low so she was not start on beta blocker.  Was also noted that she's had some borderline low potassium levels.  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 I talked to her about the 3 ways to terminate this tachycardia including Valsalva maneuver, stimulation of the diving reflex, and carotid sinus massage. I've also given her prescription for propranolol 10 mg which she can take on an as-needed basis to help terminate this tachycardia.  Also discussed the possibility of radiofrequency ablation.  I suspect that these episodes were initiated by premature ventricular contraction. I've asked her to increase her potassium intake and have written her a prescription for potassium chloride 10 mEq a day.  Bastard exercise on a regular basis. I'll see her again in 3 months. We'll check a basic metabolic profile at that time.

## 2011-07-23 NOTE — Progress Notes (Signed)
Toni [name] Date of Birth  09/02/1945 [name]     [name]  [address]    [address]   [address] [name], [place]  [address]    [name], [place]  [address] [phone]  Fax  [phone]  [phone]  Fax [phone]  Problems List: 1. Supraventricular Tachycardia 2. Gastroesophageal reflux disease 3.  Aortic Sclerosis  History of Present Illness:  This is a 66 year old female who presented to the emergency room this past weekend with tachycardia palpitations.  Palpitations woke her up out of her sleep. Her heart was racing very quickly.  And with some mild pain. She she was diagnosed with supraventricular tachycardia. She received IV adenosine and converted back to sinus rhythm.  She's feeling quite a bit better but she still relates some chest soreness.    Current Outpatient Prescriptions on File Prior to Visit  Medication Sig Dispense Refill  . aspirin 81 MG tablet Take 81 mg by mouth daily.        Marland Kitchen CALCIUM CITRATE PO Take 1 tablet by mouth daily.       . cholecalciferol (VITAMIN D) 1000 UNITS tablet Take 1,000 Units by mouth daily.        . LUTEIN PO Take 1 tablet by mouth daily.        . Omega-3 Fatty Acids (FISH OIL) 1000 MG CAPS Take 1 capsule by mouth daily.        . RABEprazole (ACIPHEX) 20 MG tablet Take 1 tablet (20 mg total) by mouth 2 (two) times daily.  60 tablet  11    Allergies  Allergen Reactions  . Dexlansoprazole     REACTION: pressure in ears  . Lansoprazole     REACTION: pressure in ears  . Omeprazole     REACTION: pressure in ears    Past Medical History  Diagnosis Date  . Aortic sclerosis 2000    on ECHO  . Stricture and stenosis of esophagus     dilation X 3  . Esophageal reflux   . Diverticulosis of colon (without mention of hemorrhage)   . Colon polyps     Dr Jarold Motto  . Hyperlipidemia 1999    LDL 158, resolved with diet  . Eye abnormality     OD ocular freckle    Past Surgical History    Procedure Date  . Ovarian wedge resection     to allow pregnancy  . Knee arthroscopy     bilateral  . Ankle surgery     right  . Tonsillectomy   . Colonoscopy w/ polypectomy 2011    due 2016    History  Smoking status  . Never Smoker   Smokeless tobacco  . Never Used    History  Alcohol Use  . Yes    socially    Family History  Problem Relation Age of Onset  . Stroke Father   . Arthritis Mother     OA  . Hypertension Mother   . Hypothyroidism Mother   . Hypothyroidism Sister   . Heart disease Sister 89    MI  . Osteopenia Sister   . Ovarian cancer Sister   . Hypertrophic cardiomyopathy Sister   . Diabetes Maternal Grandmother     Reviw of Systems:  Reviewed in the HPI.  All other systems are negative.  Physical Exam: Blood pressure 133/81, pulse 87, height 5\' 3"  (1.6 m), weight 181 lb 12.8 oz (82.464 kg). General: Well developed, well  nourished, in no acute distress.  Head: Normocephalic, atraumatic, sclera non-icteric, mucus membranes are moist,   Neck: Supple. Carotids are 2 + without bruits. No JVD  Lungs: Clear bilaterally to auscultation.  Heart: regular rate  With normal  S1 S2. No murmurs, gallops or rubs.  Abdomen: Soft, non-tender, non-distended with normal bowel sounds. No hepatomegaly. No rebound/guarding. No masses.  Msk:  Strength and tone are normal  Extremities: No clubbing or cyanosis. No edema.  Distal pedal pulses are 2+ and equal bilaterally.  Neuro: Alert and oriented X 3. Moves all extremities spontaneously.  Psych:  Responds to questions appropriately with a normal affect.  ECG:  from December. NSR. Frequent PVCs. Her EKG from the ER during her recent visit revealed supraventricular tachycardia. The second EKG revealed normal sinus rhythm.  Assessment / Plan:

## 2011-07-26 ENCOUNTER — Ambulatory Visit (INDEPENDENT_AMBULATORY_CARE_PROVIDER_SITE_OTHER): Payer: Managed Care, Other (non HMO) | Admitting: *Deleted

## 2011-07-26 ENCOUNTER — Telehealth: Payer: Self-pay | Admitting: Cardiovascular Disease

## 2011-07-26 VITALS — BP 134/84 | HR 61

## 2011-07-26 DIAGNOSIS — I498 Other specified cardiac arrhythmias: Secondary | ICD-10-CM

## 2011-07-26 DIAGNOSIS — R0609 Other forms of dyspnea: Secondary | ICD-10-CM

## 2011-07-26 DIAGNOSIS — F22 Delusional disorders: Secondary | ICD-10-CM

## 2011-07-26 DIAGNOSIS — R002 Palpitations: Secondary | ICD-10-CM

## 2011-07-26 MED ORDER — POTASSIUM CHLORIDE CRYS ER 10 MEQ PO TBCR: 20.0000 meq | EXTENDED_RELEASE_TABLET | Freq: Every day | ORAL | Status: DC

## 2011-07-26 MED ORDER — PROPRANOLOL HCL 10 MG PO TABS: 10.0000 mg | ORAL_TABLET | Freq: Four times a day (QID) | ORAL | Status: DC | PRN

## 2011-07-26 NOTE — Patient Instructions (Signed)
Your physician has recommended you make the following change in your medication:  Start Propanolol and Potassium today  Your physician recommends that you return for lab work in: Tuesday March 5,2013  For a bmp,magnesium, T 4, and TSH  Your physician has requested that you have an echocardiogram. Echocardiography is a painless test that uses sound waves to create images of your heart. It provides your doctor with information about the size and shape of your heart and how well your heart's chambers and valves are working. This procedure takes approximately one hour. There are no restrictions for this procedure.   Your physician recommends that you schedule a follow-up appointment in: 4-6 weeks with Dr. Johney Frame Foods Rich in Potassium Food / Potassium (mg)  Apricots, dried,  cup / 378 mg   Apricots, raw, 1 cup halves / 401 mg   Avocado,  / 487 mg   Banana, 1 large / 487 mg   Beef, lean, round, 3 oz / 202 mg   Cantaloupe, 1 cup cubes / 427 mg   Dates, medjool, 5 whole / 835 mg   Ham, cured, 3 oz / 212 mg   Lentils, dried,  cup / 458 mg   Lima beans, frozen,  cup / 258 mg   Orange, 1 large / 333 mg   Orange juice, 1 cup / 443 mg   Peaches, dried,  cup / 398 mg   Peas, split, cooked,  cup / 355 mg   Potato, boiled, 1 medium / 515 mg   Prunes, dried, uncooked,  cup / 318 mg   Raisins,  cup / 309 mg   Salmon, pink, raw, 3 oz / 275 mg   Sardines, canned , 3 oz / 338 mg   Tomato, raw, 1 medium / 292 mg   Tomato juice, 6 oz / 417 mg   [place], 3 oz / 349 mg  Document Released: 05/13/2005 Document Revised: 01/23/2011 Document Reviewed: 09/26/2008 [facility] Patient Information 2012 [place], [place].

## 2011-07-26 NOTE — Telephone Encounter (Signed)
Pt calls today as she feels she is in atrial fibrillation.  "My heart beat is very irregular now".  States also that it is not rapid as it was in the ED on Sunday She has not started the Potassium or the Propanolol --- she had these sent mail order. I have asked her to come in for an EKG. Mylo Red RN

## 2011-07-26 NOTE — Telephone Encounter (Signed)
Pt was in ER for heart beat, now heartbeat irregular ans skipping beats since yesterday, [phone]

## 2011-07-28 DIAGNOSIS — I493 Ventricular premature depolarization: Secondary | ICD-10-CM | POA: Insufficient documentation

## 2011-07-28 NOTE — Assessment & Plan Note (Signed)
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  EKg is reviewed which documents PVCs. She was to start propranolol and KCL supplements after seeing Dr Elease Hashimoto but has not yet received these in the mail (mail order).  I have given a script today for KCL and propranolol as per Dr Harvie Bridge plan to last until she receives her mail order supply.  She will return next week for repeat BMET and Mg.  In addition, we will order an echo to evaluate for structural heart disease.  Stress test is already ordered by Dr Elease Hashimoto. She should contact our office if symptoms do not improve.

## 2011-07-30 ENCOUNTER — Ambulatory Visit (INDEPENDENT_AMBULATORY_CARE_PROVIDER_SITE_OTHER): Payer: Managed Care, Other (non HMO) | Admitting: *Deleted

## 2011-07-30 DIAGNOSIS — R0989 Other specified symptoms and signs involving the circulatory and respiratory systems: Secondary | ICD-10-CM

## 2011-07-30 DIAGNOSIS — R002 Palpitations: Secondary | ICD-10-CM

## 2011-07-30 DIAGNOSIS — R0609 Other forms of dyspnea: Secondary | ICD-10-CM

## 2011-07-30 LAB — BASIC METABOLIC PANEL
BUN: 19 mg/dL (ref 6–23)
Chloride: 101 mEq/L (ref 96–112)
Creatinine, Ser: 0.8 mg/dL (ref 0.4–1.2)
GFR: 75.65 mL/min (ref >60.00)
Glucose, Bld: 72 mg/dL (ref 70–99)
Potassium: 4 mEq/L (ref 3.5–5.1)

## 2011-07-30 NOTE — Patient Instructions (Signed)
ORDERED STRESS ECHO/WILL CANCEL STRESS MYOVIEW DUE TO INSURANCE

## 2011-08-01 ENCOUNTER — Encounter (HOSPITAL_COMMUNITY): Payer: Managed Care, Other (non HMO)

## 2011-08-07 ENCOUNTER — Telehealth: Payer: Self-pay | Admitting: Internal Medicine

## 2011-08-07 ENCOUNTER — Other Ambulatory Visit (HOSPITAL_COMMUNITY): Payer: Managed Care, Other (non HMO)

## 2011-08-07 NOTE — Telephone Encounter (Signed)
New msg Pt said she thought she had echo today at 3pm. She has stress echo next Thursday. Please let her know

## 2011-08-07 NOTE — Telephone Encounter (Signed)
I lmom for pt that I would have to discuss with Dr Johney Frame and Dr Elease Hashimoto and let her know exactly what is going on.  Dr Elease Hashimoto has ordered a stress test that insurance will not cover and Dr Johney Frame says to get an Echo.  The two test were combined and a stress echo was placed in system.  Patient is calling questioning why her echo today at 3pm was canceled I have left her a message trying to explain that I will discuss with both MD's next week and get the appropriate test ordered and call her Monday

## 2011-08-10 ENCOUNTER — Other Ambulatory Visit: Payer: Self-pay | Admitting: Gastroenterology

## 2011-08-13 ENCOUNTER — Other Ambulatory Visit: Payer: Self-pay | Admitting: Internal Medicine

## 2011-08-13 DIAGNOSIS — R002 Palpitations: Secondary | ICD-10-CM

## 2011-08-13 MED ORDER — PROPRANOLOL HCL 10 MG PO TABS: 10.0000 mg | ORAL_TABLET | Freq: Four times a day (QID) | ORAL | Status: DC | PRN

## 2011-08-13 NOTE — Telephone Encounter (Signed)
Pt rtn my call re changing the echo stress to just an echo, wants to know why, and what the difference is, and what each does because she is the one requesting the stress and since insurance wouldn't pay for that , it was changed to echo stress

## 2011-08-14 ENCOUNTER — Other Ambulatory Visit: Payer: Self-pay

## 2011-08-14 ENCOUNTER — Ambulatory Visit (HOSPITAL_COMMUNITY): Payer: Managed Care, Other (non HMO) | Attending: Cardiovascular Disease

## 2011-08-14 DIAGNOSIS — I4949 Other premature depolarization: Secondary | ICD-10-CM | POA: Insufficient documentation

## 2011-08-14 DIAGNOSIS — E785 Hyperlipidemia, unspecified: Secondary | ICD-10-CM | POA: Insufficient documentation

## 2011-08-14 DIAGNOSIS — R002 Palpitations: Secondary | ICD-10-CM | POA: Insufficient documentation

## 2011-08-14 NOTE — Telephone Encounter (Signed)
Patient is going to follow with Dr Johney Frame only  We have called in her medication to mail order and she is going to have her Echo today  I will call her with the results

## 2011-08-15 ENCOUNTER — Other Ambulatory Visit (HOSPITAL_COMMUNITY): Payer: Managed Care, Other (non HMO)

## 2011-09-05 ENCOUNTER — Encounter: Payer: Self-pay | Admitting: Internal Medicine

## 2011-09-05 ENCOUNTER — Ambulatory Visit (INDEPENDENT_AMBULATORY_CARE_PROVIDER_SITE_OTHER): Payer: Managed Care, Other (non HMO) | Admitting: Internal Medicine

## 2011-09-05 VITALS — BP 140/72 | HR 70 | Resp 18 | Ht 63.0 in | Wt 183.0 lb

## 2011-09-05 DIAGNOSIS — R002 Palpitations: Secondary | ICD-10-CM

## 2011-09-05 DIAGNOSIS — I498 Other specified cardiac arrhythmias: Secondary | ICD-10-CM

## 2011-09-05 DIAGNOSIS — I471 Supraventricular tachycardia: Secondary | ICD-10-CM

## 2011-09-05 DIAGNOSIS — I4949 Other premature depolarization: Secondary | ICD-10-CM

## 2011-09-05 DIAGNOSIS — R0989 Other specified symptoms and signs involving the circulatory and respiratory systems: Secondary | ICD-10-CM

## 2011-09-05 DIAGNOSIS — I493 Ventricular premature depolarization: Secondary | ICD-10-CM

## 2011-09-05 MED ORDER — NADOLOL 20 MG PO TABS: ORAL_TABLET | ORAL | Status: DC

## 2011-09-05 NOTE — Patient Instructions (Signed)
Your physician recommends that you schedule a follow-up appointment in: 4 months with Dr Johney Frame  Labs today  Your physician has recommended you make the following change in your medication:  1) Start Nadolol 10mg  daily 2) Only take Inderal as needed

## 2011-09-06 LAB — CBC WITH DIFFERENTIAL/PLATELET
Basophils Absolute: 0 10*3/uL (ref 0.0–0.1)
Eosinophils Absolute: 0.1 10*3/uL (ref 0.0–0.7)
Hemoglobin: 13.6 g/dL (ref 12.0–15.0)
Lymphocytes Relative: 35.7 % (ref 12.0–46.0)
MCHC: 33.7 g/dL (ref 30.0–36.0)
Monocytes Relative: 4.7 % (ref 3.0–12.0)
Neutrophils Relative %: 58.1 % (ref 43.0–77.0)
RDW: 13.5 % (ref 11.5–14.6)

## 2011-09-06 LAB — BASIC METABOLIC PANEL
CO2: 27 mEq/L (ref 19–32)
Calcium: 9.3 mg/dL (ref 8.4–10.5)
Creatinine, Ser: 0.8 mg/dL (ref 0.4–1.2)
GFR: 82.65 mL/min (ref >60.00)
Glucose, Bld: 80 mg/dL (ref 70–99)
Sodium: 139 mEq/L (ref 135–145)

## 2011-09-08 ENCOUNTER — Encounter: Payer: Self-pay | Admitting: Internal Medicine

## 2011-09-08 NOTE — Progress Notes (Signed)
PCP:  Marga Melnick, MD, MD  The patient presents today for routine followup. She initially presented to [facility] ER 07/21/11 with a short RP tachycardia at 180 bpm.  This arrhythmia was terminated with adenosine.  She has had no further sustained arrhythmias but does have occasional palpitations which appear to be due to PVCs.  She finds that she feels these frequently at night, but is largely asymptomatic.  Since last being seen in our clinic, the patient reports doing very well.  Today, she denies symptoms of chest pain, shortness of breath, orthopnea, PND, lower extremity edema, dizziness, presyncope, syncope, or neurologic sequela.  The patient feels that she is tolerating medications without difficulties and is otherwise without complaint today.   Past Medical History  Diagnosis Date  . Aortic sclerosis 2000    on ECHO  . Stricture and stenosis of esophagus     dilation X 3  . Esophageal reflux   . Diverticulosis of colon (without mention of hemorrhage)   . Colon polyps     Dr Jarold Motto  . Hyperlipidemia 1999    LDL 158, resolved with diet  . Eye abnormality     OD ocular freckle  . SVT (supraventricular tachycardia)   . Premature ventricular contractions    Past Surgical History  Procedure Date  . Ovarian wedge resection     to allow pregnancy  . Knee arthroscopy     bilateral  . Ankle surgery     right  . Tonsillectomy   . Colonoscopy w/ polypectomy 2011    due 2016    Current Outpatient Prescriptions  Medication Sig Dispense Refill  . aspirin 81 MG tablet Take 81 mg by mouth daily.        Marland Kitchen CALCIUM CITRATE PO Take 1 tablet by mouth daily.       . cholecalciferol (VITAMIN D) 1000 UNITS tablet Take 1,000 Units by mouth daily.        . Omega-3 Fatty Acids (FISH OIL) 1000 MG CAPS Take 1 capsule by mouth daily.        . propranolol (INDERAL) 10 MG tablet As needed for palpitations  360 tablet  3  . RABEprazole (ACIPHEX) 20 MG tablet Take 1 tablet (20 mg total) by mouth  2 (two) times daily.  60 tablet  11  . LUTEIN PO Take 1 tablet by mouth daily.        . nadolol (CORGARD) 20 MG tablet 1/2 tablet daily      . potassium chloride (K-DUR,KLOR-CON) 10 MEQ tablet Take 2 tablets (20 mEq total) by mouth daily.  60 tablet  3    Allergies  Allergen Reactions  . Dexlansoprazole     REACTION: pressure in ears  . Lansoprazole     REACTION: pressure in ears  . Omeprazole     REACTION: pressure in ears    History   Social History  . Marital Status: Married    Spouse Name: N/A    Number of Children: 2  . Years of Education: N/A   Occupational History  . Dipl[employer] Services operational officer   .     Social History Main Topics  . Smoking status: Never Smoker   . Smokeless tobacco: Never Used  . Alcohol Use: Yes     socially  . Drug Use: No  . Sexually Active: Not on file   Other Topics Concern  . Not on file   Social History Narrative  . No narrative on file    Family History  Problem Relation Age of Onset  . Stroke Father   . Arthritis Mother     OA  . Hypertension Mother   . Hypothyroidism Mother   . Hypothyroidism Sister   . Heart disease Sister 11    MI  . Osteopenia Sister   . Ovarian cancer Sister   . Hypertrophic cardiomyopathy Sister   . Diabetes Maternal Grandmother     Physical Exam: Filed Vitals:   09/05/11 1624  BP: 140/72  Pulse: 70  Resp: 18  Height: 5\' 3"  (1.6 m)  Weight: 183 lb (83.008 kg)    GEN- The patient is well appearing, alert and oriented x 3 today.   Head- normocephalic, atraumatic Eyes-  Sclera clear, conjunctiva pink Ears- hearing intact Oropharynx- clear Neck- supple, no JVP Lymph- no cervical lymphadenopathy Lungs- Clear to ausculation bilaterally, normal work of breathing Heart- Regular rate and rhythm with occasional ectopy, no murmurs, rubs or gallops, PMI not laterally displaced GI- soft, NT, ND, + BS Extremities- no clubbing, cyanosis, or edema MS- no significant deformity or atrophy Skin- no rash or  lesion Psych- euthymic mood, full affect Neuro- strength and sensation are intact  Assessment and Plan:

## 2011-09-08 NOTE — Assessment & Plan Note (Signed)
Echo reveals no structural heart disease Low risk ekg Will continue nadolol.  She can take inderal prn if palpitations worsen. She will contact me if symptoms progress.

## 2011-09-08 NOTE — Assessment & Plan Note (Signed)
Doing well without recurrence. Therapeutic strategies for supraventricular tachycardia including medicine and ablation were discussed in detail with the patient today. Risk, benefits, and alternatives to EP study and radiofrequency ablation were also discussed in detail today. At this time, we will treat medically with nadolol 10mg  daily. If she has recurrent SVT, we will consider ablation.

## 2011-09-10 ENCOUNTER — Other Ambulatory Visit: Payer: Self-pay | Admitting: Internal Medicine

## 2011-09-10 DIAGNOSIS — R002 Palpitations: Secondary | ICD-10-CM

## 2011-09-10 MED ORDER — NADOLOL 20 MG PO TABS: ORAL_TABLET | ORAL | Status: DC

## 2011-09-10 NOTE — Telephone Encounter (Signed)
Spoke with patient.

## 2011-09-10 NOTE — Telephone Encounter (Signed)
New msg Pt wants to know test results from last thursday

## 2011-09-10 NOTE — Telephone Encounter (Signed)
Spoke with patient and she is aware of labs  She is on Nadolol 20mg  1/2 tablet daily  Sent is 90 supply  #45 with 3 refills

## 2011-10-24 ENCOUNTER — Other Ambulatory Visit: Payer: Managed Care, Other (non HMO)

## 2011-10-24 ENCOUNTER — Ambulatory Visit: Payer: Managed Care, Other (non HMO) | Admitting: Cardiovascular Disease

## 2011-11-14 ENCOUNTER — Other Ambulatory Visit: Payer: Self-pay | Admitting: Gastroenterology

## 2011-11-27 ENCOUNTER — Other Ambulatory Visit: Payer: Self-pay | Admitting: Gastroenterology

## 2011-12-19 ENCOUNTER — Other Ambulatory Visit: Payer: Self-pay | Admitting: Gastroenterology

## 2012-01-01 ENCOUNTER — Encounter (INDEPENDENT_AMBULATORY_CARE_PROVIDER_SITE_OTHER): Payer: Managed Care, Other (non HMO) | Admitting: Ophthalmology

## 2012-01-01 DIAGNOSIS — H43819 Vitreous degeneration, unspecified eye: Secondary | ICD-10-CM

## 2012-01-01 DIAGNOSIS — H3530 Unspecified macular degeneration: Secondary | ICD-10-CM

## 2012-01-01 DIAGNOSIS — H2510 Age-related nuclear cataract, unspecified eye: Secondary | ICD-10-CM

## 2012-01-01 DIAGNOSIS — D3130 Benign neoplasm of unspecified choroid: Secondary | ICD-10-CM

## 2012-01-13 ENCOUNTER — Encounter: Payer: Self-pay | Admitting: Internal Medicine

## 2012-01-13 ENCOUNTER — Ambulatory Visit (INDEPENDENT_AMBULATORY_CARE_PROVIDER_SITE_OTHER): Payer: Managed Care, Other (non HMO) | Admitting: Internal Medicine

## 2012-01-13 VITALS — BP 123/74 | HR 56 | Resp 18 | Ht 63.0 in | Wt 173.0 lb

## 2012-01-13 DIAGNOSIS — I4949 Other premature depolarization: Secondary | ICD-10-CM

## 2012-01-13 DIAGNOSIS — I493 Ventricular premature depolarization: Secondary | ICD-10-CM

## 2012-01-13 DIAGNOSIS — I359 Nonrheumatic aortic valve disorder, unspecified: Secondary | ICD-10-CM

## 2012-01-13 NOTE — Progress Notes (Signed)
PCP: Marga Melnick, MD  Toni [name] is a 66 y.o. female who presents today for routine electrophysiology followup.  Since last being seen in our clinic, the patient reports doing very well.  Today, she denies symptoms of palpitations, chest pain, shortness of breath,  lower extremity edema, dizziness, presyncope, or syncope.  The patient is otherwise without complaint today.   Past Medical History  Diagnosis Date  . Aortic sclerosis 2000    on ECHO  . Stricture and stenosis of esophagus     dilation X 3  . Esophageal reflux   . Diverticulosis of colon (without mention of hemorrhage)   . Colon polyps     Dr Jarold Motto  . Hyperlipidemia 1999    LDL 158, resolved with diet  . Eye abnormality     OD ocular freckle  . SVT (supraventricular tachycardia)   . Premature ventricular contractions    Past Surgical History  Procedure Date  . Ovarian wedge resection     to allow pregnancy  . Knee arthroscopy     bilateral  . Ankle surgery     right  . Tonsillectomy   . Colonoscopy w/ polypectomy 2011    due 2016    Current Outpatient Prescriptions  Medication Sig Dispense Refill  . aspirin 81 MG tablet Take 81 mg by mouth daily.        Marland Kitchen CALCIUM CITRATE PO Take 1 tablet by mouth daily.       . cholecalciferol (VITAMIN D) 1000 UNITS tablet Take 1,000 Units by mouth daily.        . LUTEIN PO Take 1 tablet by mouth daily.        . nadolol (CORGARD) 20 MG tablet 1/2 tablet daily  45 tablet  3  . Omega-3 Fatty Acids (FISH OIL) 1000 MG CAPS Take 1 capsule by mouth daily.        . propranolol (INDERAL) 10 MG tablet As needed for palpitations  360 tablet  3  . RABEprazole (ACIPHEX) 20 MG tablet Take 1 tablet (20 mg total) by mouth 2 (two) times daily.  60 tablet  11  . potassium chloride (K-DUR,KLOR-CON) 10 MEQ tablet Take 2 tablets (20 mEq total) by mouth daily.  60 tablet  3  . DISCONTD: ACIPHEX 20 MG tablet TAKE 1 TABLET BY MOUTH TWICE DAILY  60 tablet  3  . DISCONTD: nadolol  (CORGARD) 20 MG tablet 1/2 tablet daily  90 tablet  2    Physical Exam: Filed Vitals:   01/13/12 1430  BP: 123/74  Pulse: 56  Resp: 18  Height: 5\' 3"  (1.6 m)  Weight: 173 lb (78.472 kg)  SpO2: 98%    GEN- The patient is well appearing, alert and oriented x 3 today.   Head- normocephalic, atraumatic Eyes-  Sclera clear, conjunctiva pink Ears- hearing intact Oropharynx- clear Lungs- Clear to ausculation bilaterally, normal work of breathing Heart- Regular rate and rhythm, no murmurs, rubs or gallops, PMI not laterally displaced GI- soft, NT, ND, + BS Extremities- no clubbing, cyanosis, or edema  ekg today reveals sinus rhythm 62 bm, poor R wave progression  Assessment and Plan:

## 2012-01-13 NOTE — Assessment & Plan Note (Signed)
Stable No change required today   Return in 12 months

## 2012-01-13 NOTE — Assessment & Plan Note (Signed)
No further episodes No changes

## 2012-01-13 NOTE — Patient Instructions (Addendum)
Your physician wants you to follow-up in:  12 months.  You will receive a reminder letter in the mail two months in advance. If you don't receive a letter, please call our office to schedule the follow-up appointment.   

## 2012-02-06 ENCOUNTER — Inpatient Hospital Stay (HOSPITAL_COMMUNITY): Payer: Managed Care, Other (non HMO)

## 2012-02-06 ENCOUNTER — Inpatient Hospital Stay (HOSPITAL_COMMUNITY)
Admission: AD | Admit: 2012-02-06 | Discharge: 2012-02-06 | Disposition: A | Discharge status: Home / Self Care | Payer: Managed Care, Other (non HMO) | Source: Ambulatory Visit | Attending: Emergency Medicine | Admitting: Emergency Medicine

## 2012-02-06 ENCOUNTER — Encounter (HOSPITAL_COMMUNITY): Payer: Self-pay | Admitting: *Deleted

## 2012-02-06 DIAGNOSIS — I471 Supraventricular tachycardia: Secondary | ICD-10-CM

## 2012-02-06 DIAGNOSIS — I498 Other specified cardiac arrhythmias: Secondary | ICD-10-CM

## 2012-02-06 LAB — COMPREHENSIVE METABOLIC PANEL
ALT: 12 U/L (ref 0–35)
AST: 15 U/L (ref 0–37)
Albumin: 4.2 g/dL (ref 3.5–5.2)
Alkaline Phosphatase: 85 U/L (ref 39–117)
Chloride: 102 mEq/L (ref 96–112)
Potassium: 3.9 mEq/L (ref 3.5–5.1)
Sodium: 138 mEq/L (ref 135–145)
Total Bilirubin: 0.4 mg/dL (ref 0.3–1.2)

## 2012-02-06 LAB — CBC
MCH: 29.2 pg (ref 26.0–34.0)
MCV: 85.2 fL (ref 78.0–100.0)
Platelets: 264 10*3/uL (ref 150–400)
RBC: 4.72 MIL/uL (ref 3.87–5.11)

## 2012-02-06 MED ORDER — METOPROLOL TARTRATE 1 MG/ML IV SOLN
5.0000 mg | Freq: Once | INTRAVENOUS | Status: AC
  Administered 2012-02-06: 5 mg via INTRAVENOUS
  Filled 2012-02-06: qty 5

## 2012-02-06 MED ORDER — ADENOSINE 6 MG/2ML IV SOLN: 6.0000 mg | Freq: Once | INTRAVENOUS | Status: DC

## 2012-02-06 MED ORDER — SODIUM CHLORIDE 0.9 % IV SOLN
INTRAVENOUS | Status: DC
  Administered 2012-02-06: 16:00:00 via INTRAVENOUS

## 2012-02-06 NOTE — ED Notes (Signed)
EKG at bedside . SVT rhythm noted notified  E.Key.NP.  Pt to be transfered to [facility]

## 2012-02-06 NOTE — ED Notes (Addendum)
Pt reports while sitting at desk onset palpitations, heart racing & slight dizziness at approx 1430, then noted to have HR 180's. Denied any CP, SOB, n/v, syncope. Pt had first & only episode of SVT in Feb '13 & given adenosine. States she forgot to take her daily dose of nadolol this morning but took her PRN propanolol 10mg  at 1430 & again at 1450

## 2012-02-06 NOTE — MAU Provider Note (Signed)
History     CSN: [id number]  Arrival date and time: 02/06/12 1502   First Provider Initiated Contact with Patient 02/06/12 1511      No chief complaint on file.  HPI Toni [name] is 66 y.o. 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  She reports having a similar episode in February.  She is being followed by Dr. Johney Frame at [place].  She has propranolol.  She was seen recently by Dr. Johney Frame.  She has had ECG and Echo and told impulse may be going around in a circle.  She is not in distress, talkative.  States she attempted to vagal without change in heartrate.  She has taken 2 inderal tabs.      Past Medical History  Diagnosis Date  . Aortic sclerosis 2000    on ECHO  . Stricture and stenosis of esophagus     dilation X 3  . Esophageal reflux   . Diverticulosis of colon (without mention of hemorrhage)   . Colon polyps     Dr Jarold Motto  . Hyperlipidemia 1999    LDL 158, resolved with diet  . Eye abnormality     OD ocular freckle  . SVT (supraventricular tachycardia)   . Premature ventricular contractions     Past Surgical History  Procedure Date  . Ovarian wedge resection     to allow pregnancy  . Knee arthroscopy     bilateral  . Ankle surgery     right  . Tonsillectomy   . Colonoscopy w/ polypectomy 2011    due 2016    Family History  Problem Relation Age of Onset  . Stroke Father   . Arthritis Mother     OA  . Hypertension Mother   . Hypothyroidism Mother   . Hypothyroidism Sister   . Heart disease Sister 54    MI  . Osteopenia Sister   . Ovarian cancer Sister   . Hypertrophic cardiomyopathy Sister   . Diabetes Maternal Grandmother     History  Substance Use Topics  . Smoking status: Never Smoker   . Smokeless tobacco: Never Used  . Alcohol Use: Yes     socially    Allergies:  Allergies  Allergen Reactions  . Dexlansoprazole     REACTION: pressure in ears  . Lansoprazole     REACTION: pressure in ears  .  Omeprazole     REACTION: pressure in ears    Prescriptions prior to admission  Medication Sig Dispense Refill  . aspirin 81 MG tablet Take 81 mg by mouth daily.        Marland Kitchen CALCIUM CITRATE PO Take 1 tablet by mouth daily.       . cholecalciferol (VITAMIN D) 1000 UNITS tablet Take 1,000 Units by mouth daily.        . LUTEIN PO Take 1 tablet by mouth daily.        . nadolol (CORGARD) 20 MG tablet 1/2 tablet daily  45 tablet  3  . Omega-3 Fatty Acids (FISH OIL) 1000 MG CAPS Take 1 capsule by mouth daily.        . potassium chloride (K-DUR,KLOR-CON) 10 MEQ tablet Take 2 tablets (20 mEq total) by mouth daily.  60 tablet  3  . propranolol (INDERAL) 10 MG tablet As needed for palpitations  360 tablet  3  . RABEprazole (ACIPHEX) 20 MG tablet Take 1 tablet (20 mg total) by mouth 2 (two) times daily.  60 tablet  11    Review of Systems  Constitutional: Negative for diaphoresis.  Cardiovascular: Positive for palpitations. Negative for chest pain.  Gastrointestinal: Negative for heartburn and nausea.   Physical Exam   There were no vitals taken for this visit.  Physical Exam  Constitutional: She is oriented to person, place, and time. She appears well-developed and well-nourished. No distress.  HENT:  Head: Normocephalic.  Neck: Normal range of motion.  Cardiovascular: Tachycardia present.   Respiratory: Effort normal and breath sounds normal.  Neurological: She is alert and oriented to person, place, and time.  Skin: Skin is warm and dry. She is not diaphoretic.  Psychiatric: She has a normal mood and affect. Her behavior is normal.   Results for orders placed during the hospital encounter of 02/06/12 (from the past 24 hour(s))  CBC     Status: Normal   Collection Time   02/06/12  3:06 PM      Component Value Range   WBC 8.4  4.0 - 10.5 K/uL   RBC 4.72  3.87 - 5.11 MIL/uL   Hemoglobin 13.8  12.0 - 15.0 g/dL   HCT 04.5  40.9 - 81.1 %   MCV 85.2  78.0 - 100.0 fL   MCH 29.2  26.0 - 34.0  pg   MCHC 34.3  30.0 - 36.0 g/dL   RDW 91.4  78.2 - 95.6 %   Platelets 264  150 - 400 K/uL   MAU Course  Procedures  ECG-Vent rate 155, SVT with nonspecific ST abnormality.    MDM 15:05  Chest sounds are clear, HR is 160.  EKG, labs ordered.   O2 at [employer] started.   15:30  Consulted with Dr. Ignacia Palma at [facility].  Report given.  Transferred care to him.  [facility] called for transport.    15:48  Heart rate down to 140.    Assessment and Plan  A:  Supraventricular Tachycardia  P:  [facility] to [facility] ED for further evaluation and treatment.  Joy Haegele,EVE M 02/06/2012, 3:11 PM

## 2012-02-06 NOTE — ED Notes (Signed)
From [facility] hospital - pt was in SVT with HR in 180's. Upon [facility] arrival HR down to 130's

## 2012-02-06 NOTE — ED Notes (Signed)
In to assess - husband at bedside; ccm showing SR rate 66 without ectopy; pain resting quietly on stretcher without complaint; repeat EKG done

## 2012-02-06 NOTE — MAU Note (Signed)
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 Sat down and co worker took her v/s her HR was 180. Pt stated she had this happen once before in Feb. On propranolol.

## 2012-02-06 NOTE — ED Notes (Signed)
Dr. Preston Fleeting informed of pt's HR continues 127/min. Order received

## 2012-02-06 NOTE — MAU Provider Note (Signed)
Attestation of Attending Supervision of Advanced Practitioner (CNM/NP): Evaluation and management procedures were performed by the Advanced Practitioner under my supervision and collaboration.  I have reviewed the Advanced Practitioner's note and chart, and I agree with the management and plan.  Taven Strite, MD, FACOG Attending Obstetrician & Gynecologist Faculty Practice, [facility]  

## 2012-02-06 NOTE — ED Notes (Signed)
Arrived via [facility] from [facility] hospital for SVT.

## 2012-02-06 NOTE — ED Provider Notes (Signed)
History     CSN: [id number]  Arrival date & time 02/06/12  1502   None     Chief Complaint  Patient presents with  . Tachycardia    (Consider location/radiation/quality/duration/timing/severity/associated sxs/prior treatment) HPI  66 year old F who presents with palpitation and tachycardia. It started suddenly at 2:30 PM while the patient was at work. She works at [employer], where she was initially evaluated in the MAU and told her heart right was 186. She took propranolol 10 mg x 2 and brought her HR into the 130s. She says that her lightheadedness improved with the decreased heart rated. She denies chest pain and difficulty breathing. She has a history of SVT first noted on February 2013, which responded to Adenosine. Since that time she has been taking Nadolol and followed by Dr. Johney Frame at [facility]. She denies any other incidents. She did miss her dose of Nadolol this morning, but notes that she had missed it before without incident. She drinks 1.5 cups of coffee daily and has no other caffeine. She denies nicotine use.   Past Medical History  Diagnosis Date  . Aortic sclerosis 2000    on ECHO  . Stricture and stenosis of esophagus     dilation X 3  . Esophageal reflux   . Diverticulosis of colon (without mention of hemorrhage)   . Colon polyps     Dr Jarold Motto  . Hyperlipidemia 1999    LDL 158, resolved with diet  . Eye abnormality     OD ocular freckle  . SVT (supraventricular tachycardia)   . Premature ventricular contractions     Past Surgical History  Procedure Date  . Ovarian wedge resection     to allow pregnancy  . Knee arthroscopy     bilateral  . Ankle surgery     right  . Tonsillectomy   . Colonoscopy w/ polypectomy 2011    due 2016    Family History  Problem Relation Age of Onset  . Stroke Father   . Arthritis Mother     OA  . Hypertension Mother   . Hypothyroidism Mother   . Hypothyroidism Sister   . Heart disease Sister 61    MI  . Osteopenia Sister   . Ovarian cancer Sister   . Hypertrophic cardiomyopathy Sister   . Diabetes Maternal Grandmother     History  Substance Use Topics  . Smoking status: Never Smoker   . Smokeless tobacco: Never Used  . Alcohol Use: Yes     socially    OB History    Grav Para Term Preterm Abortions TAB SAB Ect Mult Living                  Review of Systems  Constitutional: Negative.   HENT: Negative.   Respiratory: Negative.   Cardiovascular: Positive for palpitations. Negative for chest pain.  Gastrointestinal: Negative.   Musculoskeletal: Negative.   Neurological: Negative.   Hematological: Negative.     Allergies  Dexlansoprazole; Lansoprazole; and Omeprazole  Home Medications   Current Outpatient Rx  Name Route Sig Dispense Refill  . ASPIRIN 81 MG PO TABS Oral Take 81 mg by mouth daily.      Marland Kitchen CALCIUM CITRATE PO Oral Take 1 tablet by mouth daily.     Marland Kitchen VITAMIN D 1000 UNITS PO TABS Oral Take 1,000 Units by mouth daily.      . LUTEIN PO Oral Take 1 tablet by mouth daily.      Marland Kitchen  ADULT MULTIVITAMIN W/MINERALS CH Oral Take 1 tablet by mouth daily.    . OCUVITE-LUTEIN PO CAPS Oral Take 1 capsule by mouth daily.    Marland Kitchen NADOLOL 20 MG PO TABS Oral Take 10 mg by mouth daily. 1/2 tablet daily    . FISH OIL 1000 MG PO CAPS Oral Take 1 capsule by mouth daily.      Marland Kitchen PROPRANOLOL HCL 10 MG PO TABS Oral Take 10 mg by mouth daily as needed. As needed for palpitations 360 tablet 3  . RABEPRAZOLE SODIUM 20 MG PO TBEC Oral Take 1 tablet (20 mg total) by mouth 2 (two) times daily. 60 tablet 11  . VITAMIN E PO Oral Take 1 tablet by mouth daily.      BP 120/67  Pulse 70  Temp 98.5 F (36.9 C) (Oral)  Resp 20  SpO2 99%  Physical Exam  Constitutional: She is oriented to person, place, and time. She appears well-developed and well-nourished. No distress.       Well appearing white female  HENT:  Head: Normocephalic and atraumatic.  Eyes: Conjunctivae normal are normal.  Pupils are equal, round, and reactive to light. No scleral icterus.  Neck: Normal range of motion. Neck supple.  Cardiovascular: Regular rhythm and normal pulses.  Tachycardia present.   Pulmonary/Chest: Effort normal and breath sounds normal.  Abdominal: Normal appearance. There is no tenderness.  Musculoskeletal: Normal range of motion. She exhibits no edema and no tenderness.  Neurological: She is alert and oriented to person, place, and time.  Skin: Skin is warm and dry. She is not diaphoretic.    ED Course  Procedures (including critical care time)  Labs Reviewed  COMPREHENSIVE METABOLIC PANEL - Abnormal; Notable for the following:    Glucose, Bld 129 (*)     GFR calc non Af Amer 86 (*)     All other components within normal limits  CBC  POCT I-STAT TROPONIN I   Dg Chest 2 View  02/06/2012  *RADIOLOGY REPORT*  Clinical Data: Tachycardia  CHEST - 2 VIEW  Comparison: 07/21/2011.  Findings: Normal heart size with clear lung fields.  Mild left basilar scarring.  Mildly tortuous aorta.  No hilar or mediastinal masses.  No effusion or pneumothorax.  Degenerative changes thoracic spine.  Improved aeration from priors.  IMPRESSION: No acute cardiopulmonary disease.  Mild left base scarring.   Original Report Authenticated By: Elsie Stain, M.D.     Date: 02/06/2012  Rate: 133  Rhythm: supraventricular tachycardia (SVT)  QRS Axis: normal  Intervals: PR difficult  ST/T Wave abnormalities: normal  Conduction Disutrbances:none  Narrative Interpretation: SVT   Old EKG Reviewed: dereased HR compared with ECG from earlier today; sinus rhythm noted on ECG 01/13/12  Patient was given metoprolol IV x 2 with resolution of tachycardia.   1. PAT (paroxysmal atrial tachycardia)       MDM  The patient experienced paroxysmal atrial tachycardia that responded that improved, likely secondary to multiple doses of beta blockers. She was stable after conversion and appropriate for discharge. She  will follow up with Dr. Johney Frame, her cardiologist for further management.         Garnetta Buddy, MD 02/06/12 2024

## 2012-02-06 NOTE — ED Notes (Signed)
Care link at bedside report given . Pt transferred to [facility].

## 2012-02-06 NOTE — ED Provider Notes (Addendum)
66 year old female with history of paroxysmal atrial tachycardia noted sudden onset this afternoon of a rapid heartbeat. A nurse who is working with her checked her heart rate was 186. She took 2 doses of oral propranolol and heart rate has come down. She is feeling significantly better now. She denies chest pain, dyspnea. On exam, she has tachycardia with heart rate in the low 130s. Carotid sinus massage was ineffective. Since she has significant drop in heart rate with beta blockers, she will be tried with dose of IV metoprolol. If this is not effective, though we'll use adenosine.   Date: 02/06/2012  Rate: 133  Rhythm: supraventricular tachycardia (SVT)  QRS Axis: normal  Intervals: normal  ST/T Wave abnormalities: normal  Conduction Disutrbances:none  Narrative Interpretation: Supraventricular tachycardia. When compared with ECG done earlier today, heart rate has decreased from 155-133. When compared with ECG of 07/21/2011, supraventricular tachycardia has replaced normal sinus rhythm with PVCs.  Old EKG Reviewed: unchanged    Dione Booze, MD 02/06/12 1810  With the a dose of metoprolol, heart rate dropped to 126. She was tolerating this well, so she was given a second dose of metoprolol and following that, she converted to sinus rhythm with rate of 64. At this point, she will be discharged. Since she required a fairly high dose of beta blocker to convert, and asked her to discuss with her cardiologist whether she should try using 20 mg doses of propanolol rather than 10 mg doses to try and convert her when she has a recurrence in the future.   Date: 02/06/2012  Rate: 64  Rhythm: normal sinus rhythm  QRS Axis: normal  Intervals: normal  ST/T Wave abnormalities: normal  Conduction Disutrbances:none  Narrative Interpretation: Compared with ECG of earlier today, normal sinus rhythm has replaced supraventricular tachycardia.  Old EKG Reviewed: changes noted    Dione Booze, MD 02/06/12  [phone]

## 2012-02-07 ENCOUNTER — Telehealth: Payer: Self-pay | Admitting: Internal Medicine

## 2012-02-07 NOTE — Telephone Encounter (Signed)
New problem:  C/o heart rate 186 on yesterday went to [place]. Fine now.  Was told to let Dr. Johney Frame know

## 2012-02-07 NOTE — ED Provider Notes (Signed)
I saw and evaluated the patient, reviewed the resident's note and I agree with the findings and plan.   Dione Booze, MD 02/07/12 0040

## 2012-02-07 NOTE — Telephone Encounter (Signed)
Left message for pt, will make dr allred aware of recent event and will call her back with any recommendations.

## 2012-04-01 ENCOUNTER — Encounter (HOSPITAL_COMMUNITY): Payer: Self-pay | Admitting: *Deleted

## 2012-04-01 ENCOUNTER — Emergency Department (HOSPITAL_COMMUNITY): Payer: Managed Care, Other (non HMO)

## 2012-04-01 ENCOUNTER — Emergency Department (HOSPITAL_COMMUNITY)
Admission: EM | Admit: 2012-04-01 | Discharge: 2012-04-01 | Disposition: A | Discharge status: Home / Self Care | Payer: Managed Care, Other (non HMO) | Attending: Emergency Medicine | Admitting: Emergency Medicine

## 2012-04-01 DIAGNOSIS — Z8719 Personal history of other diseases of the digestive system: Secondary | ICD-10-CM | POA: Insufficient documentation

## 2012-04-01 DIAGNOSIS — E785 Hyperlipidemia, unspecified: Secondary | ICD-10-CM | POA: Insufficient documentation

## 2012-04-01 DIAGNOSIS — Z860100 Personal history of colon polyps, unspecified: Secondary | ICD-10-CM | POA: Insufficient documentation

## 2012-04-01 DIAGNOSIS — I498 Other specified cardiac arrhythmias: Secondary | ICD-10-CM | POA: Insufficient documentation

## 2012-04-01 DIAGNOSIS — K219 Gastro-esophageal reflux disease without esophagitis: Secondary | ICD-10-CM | POA: Insufficient documentation

## 2012-04-01 DIAGNOSIS — Z7982 Long term (current) use of aspirin: Secondary | ICD-10-CM | POA: Insufficient documentation

## 2012-04-01 DIAGNOSIS — I471 Supraventricular tachycardia: Secondary | ICD-10-CM

## 2012-04-01 DIAGNOSIS — Z8679 Personal history of other diseases of the circulatory system: Secondary | ICD-10-CM | POA: Insufficient documentation

## 2012-04-01 DIAGNOSIS — Z79899 Other long term (current) drug therapy: Secondary | ICD-10-CM | POA: Insufficient documentation

## 2012-04-01 DIAGNOSIS — Z8669 Personal history of other diseases of the nervous system and sense organs: Secondary | ICD-10-CM | POA: Insufficient documentation

## 2012-04-01 LAB — BASIC METABOLIC PANEL
CO2: 23 mEq/L (ref 19–32)
Calcium: 9.5 mg/dL (ref 8.4–10.5)
Creatinine, Ser: 0.86 mg/dL (ref 0.50–1.10)
Glucose, Bld: 145 mg/dL — ABNORMAL HIGH (ref 70–99)

## 2012-04-01 LAB — CBC
MCH: 30 pg (ref 26.0–34.0)
MCV: 84.7 fL (ref 78.0–100.0)
Platelets: 261 10*3/uL (ref 150–400)
RBC: 5.04 MIL/uL (ref 3.87–5.11)

## 2012-04-01 LAB — POCT I-STAT TROPONIN I: Troponin i, poc: 0.02 ng/mL (ref 0.00–0.08)

## 2012-04-01 MED ORDER — ADENOSINE 6 MG/2ML IV SOLN
INTRAVENOUS | Status: AC
  Administered 2012-04-01: 6 mg
  Filled 2012-04-01: qty 8

## 2012-04-01 MED ORDER — POTASSIUM CHLORIDE CRYS ER 20 MEQ PO TBCR
40.0000 meq | EXTENDED_RELEASE_TABLET | Freq: Once | ORAL | Status: AC
  Administered 2012-04-01: 40 meq via ORAL
  Filled 2012-04-01: qty 2

## 2012-04-01 NOTE — ED Notes (Signed)
Crash cart at bedside with MD, Resident, and two RN's preparing to cardiovert

## 2012-04-01 NOTE — ED Notes (Signed)
Pt reports racing heart around 1530 today. States that took propanolol q 30 minutes x 5 starting at 1530 without relief. Pt reports SVT and PVC's in the past. Pt sees Dr. Hillis Range for cardiac issues. Pt alert and oriented. Denies chest pain. Reports dizziness and SOB with activity.

## 2012-04-01 NOTE — ED Provider Notes (Signed)
History     CSN: [id number]  Arrival date & time 04/01/12  1924   First MD Initiated Contact with Patient 04/01/12 1959      Chief Complaint  Patient presents with  . Tachycardia    (Consider location/radiation/quality/duration/timing/severity/associated sxs/prior treatment) HPI Palpitations started suddenly about 5 hours ago. Prior treatment includes vagal manuevers, 10 mg PO propranolol every 30 minutes x5 without resolution. Previous h/o of SVT. Denies any chest pain. This occurred while the patient was sitting at home. Course has been constant. Associated symptoms include some mild SOB while walking up stairs. None currently. No fever. Pt states she drank one cup of coffee this morning which is normal for her. No prodromal symptoms.   Past Medical History  Diagnosis Date  . Aortic sclerosis 2000    on ECHO  . Stricture and stenosis of esophagus     dilation X 3  . Esophageal reflux   . Diverticulosis of colon (without mention of hemorrhage)   . Colon polyps     Dr Jarold Motto  . Hyperlipidemia 1999    LDL 158, resolved with diet  . Eye abnormality     OD ocular freckle  . SVT (supraventricular tachycardia)   . Premature ventricular contractions     Past Surgical History  Procedure Date  . Ovarian wedge resection     to allow pregnancy  . Knee arthroscopy     bilateral  . Ankle surgery     right  . Tonsillectomy   . Colonoscopy w/ polypectomy 2011    due 2016    Family History  Problem Relation Age of Onset  . Stroke Father   . Arthritis Mother     OA  . Hypertension Mother   . Hypothyroidism Mother   . Hypothyroidism Sister   . Heart disease Sister 23    MI  . Osteopenia Sister   . Ovarian cancer Sister   . Hypertrophic cardiomyopathy Sister   . Diabetes Maternal Grandmother     History  Substance Use Topics  . Smoking status: Never Smoker   . Smokeless tobacco: Never Used  . Alcohol Use: Yes     Comment: socially    OB History    Grav Para  Term Preterm Abortions TAB SAB Ect Mult Living                  Review of Systems Negative for respiratory distress, cough. Negative for vomiting, diarrhea.  All other systems reviewed and negative unless noted in HPI.    Allergies  Dexlansoprazole; Lansoprazole; and Omeprazole  Home Medications   Current Outpatient Rx  Name  Route  Sig  Dispense  Refill  . ASPIRIN 81 MG PO TABS   Oral   Take 81 mg by mouth daily.           Marland Kitchen CALCIUM CITRATE PO   Oral   Take 1 tablet by mouth daily.          Marland Kitchen VITAMIN D 1000 UNITS PO TABS   Oral   Take 1,000 Units by mouth daily.           . LUTEIN PO   Oral   Take 1 tablet by mouth daily.           . ADULT MULTIVITAMIN W/MINERALS CH   Oral   Take 1 tablet by mouth daily.         . OCUVITE-LUTEIN PO CAPS   Oral   Take 1 capsule by  mouth daily.         Marland Kitchen NADOLOL 20 MG PO TABS   Oral   Take 10 mg by mouth daily. 1/2 tablet daily         . FISH OIL 1000 MG PO CAPS   Oral   Take 1 capsule by mouth daily.           Marland Kitchen PROPRANOLOL HCL 10 MG PO TABS   Oral   Take 10 mg by mouth daily as needed. As needed for palpitations   360 tablet   3   . RABEPRAZOLE SODIUM 20 MG PO TBEC   Oral   Take 1 tablet (20 mg total) by mouth 2 (two) times daily.   60 tablet   11   . VITAMIN E PO   Oral   Take 1 tablet by mouth daily.           BP 109/78  Pulse 169  Resp 18  SpO2 96%  Physical Exam Nursing note and vitals reviewed.  Constitutional: Pt is alert and appears stated age. Oropharynx: Airway open without erythema or exudate. Respiratory: No respiratory distress. Equal breathing bilaterally. CV: Extremities warm and well perfused. Tachycardic.  Neuro: No motor nor sensory deficit. Head: Normocephalic and atraumatic. Eyes: No conjunctivitis, no scleral icterus. Neck: Supple, no mass. Chest: Non-tender. Abdomen: Soft, non-tender MSK: Extremities are atraumatic without deformity. Skin: No rash, no  wounds.  ED Course  Procedures (including critical care time)  Labs Reviewed  CBC - Abnormal; Notable for the following:    WBC 11.7 (*)     Hemoglobin 15.1 (*)     All other components within normal limits  BASIC METABOLIC PANEL - Abnormal; Notable for the following:    Glucose, Bld 145 (*)     GFR calc non Af Amer 70 (*)     GFR calc Af Amer 81 (*)     All other components within normal limits  POCT I-STAT TROPONIN I   Dg Chest 2 View  04/01/2012  *RADIOLOGY REPORT*  Clinical Data: Tachycardia  CHEST - 2 VIEW  Comparison: 02/06/2012  Findings: Cardiomediastinal silhouette is stable. No acute infiltrate or pleural effusion.  No pulmonary edema.  Stable degenerative changes thoracic spine.  IMPRESSION: No active disease.  No significant change.   Original Report Authenticated By: Natasha Mead, M.D.      1. Supraventricular tachycardia       MDM  66 y.o. female here with palpiations.  Pertinent past problems include prior SVT x2. Chart review shows pt given adenosine back in 06/2011 and IV lopressor x2 01/2012 both with resolution.  EKG ordered and interpreted by me: tachycardia, no axis deviation, no QRS widening and no ST segment changes. SVT.  Lab tests ordered and reviewed by me: troponin low. CBC, BMP unremarkable. I independently viewed the following imaging studies and reviewed radiology's interpretation as summarized: CXR without acute disease.  Course of care: Given 6 mg adenosine with resolution. Repeat EKG of NSR rate of 78. No QRS widening, normal axis, no ST segment changes. Discussed with cardiologist Dr. Adolm Joseph on call for [place] who will come see patient here in ED.   Discussed with cards. Plan to d/c home. Will give 40 meq KCl prior to d/c. Pt stable on my re-eval. Counseling provided regarding diagnosis, treatment plan, follow up recommendations, and return precautions. Questions answered  Medical Decision Making discussed with ED attending Gwyneth Sprout, MD  Charm Barges, MD 04/01/12 2207

## 2012-04-01 NOTE — Consult Note (Signed)
CARDIOLOGY CONSULT NOTE  Patient ID: KAIYAH EBER, MRN: [id number], DOB/AGE: 06-08-1945 66 y.o. Admit date: 04/01/2012 Date of Consult: 04/01/2012  Primary Physician: Marga Melnick, MD Primary Cardiologist: Dr. Johney Frame  Chief Complaint: tachycardia/palpitations Reason for Consultation: Disposition  HPI: 66 y.o. female w/ PMHx significant for SVT who presented to [facility] on 04/01/2012 with complaints of palpitations and tachycardia. This is the third such occurrence that has required an ER visit. She was first diagnosed with SVT in February when she required adenosine to convert her. She was seen by cardiology who started a suppressive beta blocker as well as instructions for acute treatment and prn propranolol. She had another episode in September which failed to respond to her home interventions and she sought care at [facility] and required adenosine to convert. Echo showed no significant structural disease, thyroid function normal in December.  Today around 4:00 pm she had recurrence of her symptoms (palpitations and the sense of her heart going fast) and she attempted all the vagal maneuvers (cold compress, bearing down) to no avail. She took propranolol 10 mg q75min x 5 also to no avail. She denied chest pain, syncope, presyncope, confusion. She did feel fatigued attempting stairs so she rested until her husband returned home from work. At that time, she sough care at [facility]. In the ER, a 12 lead confirmed SVT at 160 and 6 mg of adenosine was bolused. She easily converted without symptoms from the adenosine.   Currently, she feels at baseline and is ready to go home. She denies any change in medication, med noncompliance, excessive caffeine, stimulants etc.   Past Medical History  Diagnosis Date  . Aortic sclerosis 2000    on ECHO  . Stricture and stenosis of esophagus     dilation X 3  . Esophageal reflux   . Diverticulosis of colon (without mention of hemorrhage)   .  Colon polyps     Dr Jarold Motto  . Hyperlipidemia 1999    LDL 158, resolved with diet  . Eye abnormality     OD ocular freckle  . SVT (supraventricular tachycardia)   . Premature ventricular contractions       Surgical History:  Past Surgical History  Procedure Date  . Ovarian wedge resection     to allow pregnancy  . Knee arthroscopy     bilateral  . Ankle surgery     right  . Tonsillectomy   . Colonoscopy w/ polypectomy 2011    due 2016     Home Meds: Prior to Admission medications   Medication Sig Start Date End Date Taking? Authorizing Provider  aspirin 81 MG tablet Take 81 mg by mouth daily.     Yes Historical Provider, MD  CALCIUM CITRATE PO Take 1 tablet by mouth daily.    Yes Historical Provider, MD  cholecalciferol (VITAMIN D) 1000 UNITS tablet Take 1,000 Units by mouth daily.     Yes Historical Provider, MD  LUTEIN PO Take 1 tablet by mouth daily.     Yes Historical Provider, MD  Multiple Vitamin (MULTIVITAMIN WITH MINERALS) TABS Take 1 tablet by mouth daily.   Yes Historical Provider, MD  multivitamin-lutein (OCUVITE-LUTEIN) CAPS Take 1 capsule by mouth daily.   Yes Historical Provider, MD  nadolol (CORGARD) 20 MG tablet Take 10 mg by mouth daily. 1/2 tablet daily 09/10/11  Yes Hillis Range, MD  Omega-3 Fatty Acids (FISH OIL) 1000 MG CAPS Take 1 capsule by mouth daily.     Yes  Historical Provider, MD  propranolol (INDERAL) 10 MG tablet Take 10 mg by mouth daily as needed. As needed for palpitations 09/05/11  Yes Hillis Range, MD  RABEprazole (ACIPHEX) 20 MG tablet Take 20 mg by mouth 2 (two) times daily.   Yes Historical Provider, MD  VITAMIN E PO Take 1 tablet by mouth daily.   Yes Historical Provider, MD    Inpatient Medications:    . [COMPLETED] adenosine          Allergies:  Allergies  Allergen Reactions  . Dexlansoprazole     REACTION: pressure in ears  . Lansoprazole     REACTION: pressure in ears  . Omeprazole     REACTION: pressure in ears     History   Social History  . Marital Status: Married    Spouse Name: N/A    Number of Children: 2  . Years of Education: N/A   Occupational History  . Diplomatic Services operational officer   .     Social History Main Topics  . Smoking status: Never Smoker   . Smokeless tobacco: Never Used  . Alcohol Use: Yes     Comment: socially  . Drug Use: No  . Sexually Active: Not on file   Other Topics Concern  . Not on file   Social History Narrative  . No narrative on file     Family History  Problem Relation Age of Onset  . Stroke Father   . Arthritis Mother     OA  . Hypertension Mother   . Hypothyroidism Mother   . Hypothyroidism Sister   . Heart disease Sister 20    MI  . Osteopenia Sister   . Ovarian cancer Sister   . Hypertrophic cardiomyopathy Sister   . Diabetes Maternal Grandmother      Review of Systems: General: negative for chills, fever, night sweats or weight changes.  Cardiovascular: negative for chest pain, shortness of breath, dyspnea on exertion, edema, orthopnea, palpitations, or paroxysmal nocturnal dyspnea Dermatological: negative for rash Respiratory: negative for cough or wheezing Urologic: negative for hematuria Abdominal: negative for nausea, vomiting, diarrhea, bright red blood per rectum, melena, or hematemesis Neurologic: negative for visual changes, syncope, or dizziness All other systems reviewed and are otherwise negative except as noted above.  Labs: No results found for this basename: CKTOTAL:4,CKMB:4,TROPONINI:4 in the last 72 hours Lab Results  Component Value Date   WBC 11.7* 04/01/2012   HGB 15.1* 04/01/2012   HCT 42.7 04/01/2012   MCV 84.7 04/01/2012   PLT 261 04/01/2012    Lab 04/01/12 1944  NA 140  K 3.7  CL 105  CO2 23  BUN 19  CREATININE 0.86  CALCIUM 9.5  PROT --  BILITOT --  ALKPHOS --  ALT --  AST --  GLUCOSE 145*   Lab Results  Component Value Date   CHOL 202* 05/14/2011   HDL 64.40 05/14/2011   LDLCALC 114* 04/12/2010   TRIG  64.0 05/14/2011   No results found for this basename: DDIMER    Radiology/Studies:  Dg Chest 2 View  04/01/2012  *RADIOLOGY REPORT*  Clinical Data: Tachycardia  CHEST - 2 VIEW  Comparison: 02/06/2012  Findings: Cardiomediastinal silhouette is stable. No acute infiltrate or pleural effusion.  No pulmonary edema.  Stable degenerative changes thoracic spine.  IMPRESSION: No active disease.  No significant change.   Original Report Authenticated By: Natasha Mead, M.D.     EKG: initial 12 lead demonstrates narrow complex tachycardia with an apparent retrograde p  wave at the beginning of the ST segment. Seen best in lead V1 and V2. No ischemic changes.  Rhythm strip from cardioversion shows conversion without pause.  Follow up EKG shows sinus with PVC. No ischemic changes.  Physical Exam: Blood pressure 109/67, pulse 79, resp. rate 14, SpO2 100.00%. General: Well developed, well nourished, in no acute distress. Head: Normocephalic, atraumatic, sclera non-icteric, no xanthomas, nares are without discharge.  Neck: Supple. Negative for carotid bruits. JVD not elevated. Lungs: Clear bilaterally to auscultation without wheezes, rales, or rhonchi. Breathing is unlabored. Heart: RRR with S1 S2. No murmurs, rubs, or gallops appreciated. Abdomen: Soft, non-tender, non-distended with normoactive bowel sounds. No hepatomegaly. No rebound/guarding. No obvious abdominal masses. Msk:  Strength and tone appear normal for age. Extremities: No clubbing or cyanosis. No edema.  Distal pedal pulses are 2+ and equal bilaterally. Neuro: Alert and oriented X 3. Moves all extremities spontaneously. Psych:  Responds to questions appropriately with a normal affect.   Problem List: 1. SVT, appears to be long R-P, now converted with adenosine  Assessment and Plan:  66 y.o. female w/ PMHx significant for SVT who presented to [facility] on 04/01/2012 with complaints of palpitations and tachycardia; found to be in  SVT again and converted to sinus with adenosine. Prior workup including echo and TSH benign.  This is her third such episode and second since being on suppressive nadolol. Reviewing her records, she is typically borderline bradycardic which limits the ability to increasing her beta blocker. Switching to a calcium channel blocker could be considered but I think she is a good candidate for ablation. She has previously discussed with Dr. Ty Hilts having an ablation is aware that might be the next best step.  She has no indication to be hospitalized- her episodes do not have any red flags (no syncope, chest pain etc.) and she desires discharge. She plans to contact [place] in the AM. I will also send a note via epic to her EP physician. Continue current medications.   Signed, Adolm Joseph, Troy Sine MD 04/01/2012, 9:44 PM

## 2012-04-01 NOTE — ED Provider Notes (Addendum)
I saw and evaluated the patient, reviewed the resident's note and I agree with the findings and plan. I have reviewed EKG and agree with the resident interpretation.  you Patient presenting with SVT who is stable without associated symptoms. Cardioversion was performed with adenosine which converted patient back to a normal sinus rhythm. Cardiology was consult it and they feel patient is safe to go home. She does have propranolol to take as needed if she develops tachycardia again. Patient was given one dose of by mouth potassium her cardiologist request.   CRITICAL CARE Performed by: Gwyneth Sprout   Total critical care time: 30  Critical care time was exclusive of separately billable procedures and treating other patients.  Critical care was necessary to treat or prevent imminent or life-threatening deterioration.  Critical care was time spent personally by me on the following activities: development of treatment plan with patient and/or surrogate as well as nursing, discussions with consultants, evaluation of patient's response to treatment, examination of patient, obtaining history from patient or surrogate, ordering and performing treatments and interventions, ordering and review of laboratory studies, ordering and review of radiographic studies, pulse oximetry and re-evaluation of patient's condition.   Gwyneth Sprout, MD 04/01/12 1610  Gwyneth Sprout, MD 04/01/12 2324

## 2012-04-06 ENCOUNTER — Other Ambulatory Visit: Payer: Self-pay | Admitting: Gastroenterology

## 2012-04-06 NOTE — Telephone Encounter (Signed)
PATIENT HAS FOLLOW UP VISIT 05-26-2012.Marland Kitchen HAS TO KEEP FOR FURTHER REFILLS

## 2012-04-07 ENCOUNTER — Other Ambulatory Visit (HOSPITAL_COMMUNITY): Payer: Self-pay | Admitting: Obstetrics & Gynecology

## 2012-04-07 DIAGNOSIS — Z1231 Encounter for screening mammogram for malignant neoplasm of breast: Secondary | ICD-10-CM

## 2012-04-17 ENCOUNTER — Encounter: Payer: Self-pay | Admitting: Internal Medicine

## 2012-04-17 ENCOUNTER — Ambulatory Visit (INDEPENDENT_AMBULATORY_CARE_PROVIDER_SITE_OTHER): Payer: Managed Care, Other (non HMO) | Admitting: Internal Medicine

## 2012-04-17 VITALS — BP 132/77 | HR 70 | Ht 63.0 in | Wt 171.4 lb

## 2012-04-17 DIAGNOSIS — I493 Ventricular premature depolarization: Secondary | ICD-10-CM

## 2012-04-17 DIAGNOSIS — I4949 Other premature depolarization: Secondary | ICD-10-CM

## 2012-04-17 DIAGNOSIS — I471 Supraventricular tachycardia: Secondary | ICD-10-CM

## 2012-04-17 DIAGNOSIS — I498 Other specified cardiac arrhythmias: Secondary | ICD-10-CM

## 2012-04-17 MED ORDER — DILTIAZEM HCL 60 MG PO TABS: ORAL_TABLET | ORAL | Status: DC

## 2012-04-17 NOTE — Assessment & Plan Note (Signed)
Recurrent symptomatic short RP SVT, adenosine sensitive Therapeutic strategies for supraventricular tachycardia including medicine and ablation were discussed in detail with the patient today. Risk, benefits, and alternatives to EP study and radiofrequency ablation were also discussed in detail today. These risks include but are not limited to stroke, bleeding, vascular damage, tamponade, perforation, damage to the heart and other structures, AV block requiring pacemaker, worsening renal function, and death. The patient understands these risk and wishes to proceed. She would like to wait until February due to planned travels. I have given cardizem 60mg  q6 hours to take as needed and again instructed vagal maneuvers should tachycardia return in the interim.

## 2012-04-17 NOTE — Progress Notes (Signed)
PCP:  Marga Melnick, MD  The patient presents today for routine electrophysiology followup.  Since last being seen in our clinic, she has had 2 episodes of sustained SVT for which she has presented to the ER.  She most recently presented with abrupt onset tachypalpitations 04/01/12 and was found to have short RP SVT.  She had successful termination of her arrhyhtmia with adenosine.  Today, she denies symptoms of chest pain, shortness of breath, orthopnea, PND, lower extremity edema, dizziness, presyncope, syncope, or neurologic sequela.  The patient feels that she is tolerating medications without difficulties and is otherwise without complaint today.   Past Medical History  Diagnosis Date  . Aortic sclerosis 2000    on ECHO  . Stricture and stenosis of esophagus     dilation X 3  . Esophageal reflux   . Diverticulosis of colon (without mention of hemorrhage)   . Colon polyps     Dr Jarold Motto  . Hyperlipidemia 1999    LDL 158, resolved with diet  . Eye abnormality     OD ocular freckle  . SVT (supraventricular tachycardia)     short RP, adenosine sensitive  . Premature ventricular contractions    Past Surgical History  Procedure Date  . Ovarian wedge resection     to allow pregnancy  . Knee arthroscopy     bilateral  . Ankle surgery     right  . Tonsillectomy   . Colonoscopy w/ polypectomy 2011    due 2016    Current Outpatient Prescriptions  Medication Sig Dispense Refill  . ACIPHEX 20 MG tablet TAKE 1 TABLET BY MOUTH TWICE DAILY  60 tablet  1  . aspirin 81 MG tablet Take 81 mg by mouth daily.        Marland Kitchen CALCIUM CITRATE PO Take 1 tablet by mouth daily.       . cholecalciferol (VITAMIN D) 1000 UNITS tablet Take 1,000 Units by mouth daily.        Marland Kitchen KLOR-CON M10 10 MEQ tablet Take 10 mEq by mouth daily.       . Multiple Vitamin (MULTIVITAMIN WITH MINERALS) TABS Take 1 tablet by mouth daily.      . multivitamin-lutein (OCUVITE-LUTEIN) CAPS Take 1 capsule by mouth daily.       . nadolol (CORGARD) 20 MG tablet Take 10 mg by mouth daily. 1/2 tablet daily      . Omega-3 Fatty Acids (FISH OIL) 1000 MG CAPS Take 1 capsule by mouth daily.        . propranolol (INDERAL) 10 MG tablet Take 10 mg by mouth daily as needed. As needed for palpitations  360 tablet  3  . VITAMIN E PO Take 1 tablet by mouth daily.        Allergies  Allergen Reactions  . Dexlansoprazole     REACTION: pressure in ears  . Lansoprazole     REACTION: pressure in ears  . Omeprazole     REACTION: pressure in ears    History   Social History  . Marital Status: Married    Spouse Name: N/A    Number of Children: 2  . Years of Education: N/A   Occupational History  . Diplomatic Services operational officer   .     Social History Main Topics  . Smoking status: Never Smoker   . Smokeless tobacco: Never Used  . Alcohol Use: Yes     Comment: socially  . Drug Use: No  . Sexually Active: Not on  file   Other Topics Concern  . Not on file   Social History Narrative  . No narrative on file    Family History  Problem Relation Age of Onset  . Stroke Father   . Arthritis Mother     OA  . Hypertension Mother   . Hypothyroidism Mother   . Hypothyroidism Sister   . Heart disease Sister 67    MI  . Osteopenia Sister   . Ovarian cancer Sister   . Hypertrophic cardiomyopathy Sister   . Diabetes Maternal Grandmother     ROS-  All systems are reviewed and are negative except as outlined in the HPI above   Physical Exam: Filed Vitals:   04/17/12 1356  BP: 132/77  Pulse: 70  Height: 5\' 3"  (1.6 m)  Weight: 171 lb 6.4 oz (77.747 kg)  SpO2: 96%    GEN- The patient is well appearing, alert and oriented x 3 today.   Head- normocephalic, atraumatic Eyes-  Sclera clear, conjunctiva pink Ears- hearing intact Oropharynx- clear Neck- supple, no JVP Lymph- no cervical lymphadenopathy Lungs- Clear to ausculation bilaterally, normal work of breathing Heart- Regular rate and rhythm, no murmurs, rubs or gallops, PMI  not laterally displaced GI- soft, NT, ND, + BS Extremities- no clubbing, cyanosis, or edema MS- no significant deformity or atrophy Skin- no rash or lesion Psych- euthymic mood, full affect Neuro- strength and sensation are intact  ekg today reveals sinus rhythm 63 bpm, otherwise normal ekg ekgs from 04/01/12 and 9/13 are both revealed and reveal short RP SVT with pseudo R' in V1 and pseudo S wave in the inferior leads  Assessment and Plan:

## 2012-04-17 NOTE — Patient Instructions (Addendum)
Your physician recommends that you schedule a follow-up appointment in:  February with Dr Johney Frame     Your physician has recommended that you have an ablation. Catheter ablation is a medical procedure used to treat some cardiac arrhythmias (irregular heartbeats). During catheter ablation, a long, thin, flexible tube is put into a blood vessel in your groin (upper thigh), or neck. This tube is called an ablation catheter. It is then guided to your heart through the blood vessel. Radio frequency waves destroy small areas of heart tissue where abnormal heartbeats may cause an arrhythmia to start. Please see the instruction sheet given to you today.----SVT ablation in mid Feb  Your physician has recommended you make the following change in your medication:  1) Start Cardizem 60mg  ---1 every 6 hours as needed

## 2012-04-17 NOTE — Assessment & Plan Note (Signed)
Controlled with nadolol No changes

## 2012-05-13 ENCOUNTER — Ambulatory Visit (HOSPITAL_COMMUNITY)
Admission: RE | Admit: 2012-05-13 | Discharge: 2012-05-13 | Disposition: A | Discharge status: Home / Self Care | Payer: Managed Care, Other (non HMO) | Source: Ambulatory Visit | Attending: Obstetrics & Gynecology | Admitting: Obstetrics & Gynecology

## 2012-05-13 DIAGNOSIS — Z1231 Encounter for screening mammogram for malignant neoplasm of breast: Secondary | ICD-10-CM | POA: Insufficient documentation

## 2012-05-18 ENCOUNTER — Encounter: Payer: Managed Care, Other (non HMO) | Admitting: Internal Medicine

## 2012-05-26 ENCOUNTER — Encounter: Payer: Self-pay | Admitting: Gastroenterology

## 2012-05-26 ENCOUNTER — Ambulatory Visit (INDEPENDENT_AMBULATORY_CARE_PROVIDER_SITE_OTHER): Payer: Managed Care, Other (non HMO) | Admitting: Gastroenterology

## 2012-05-26 ENCOUNTER — Other Ambulatory Visit (INDEPENDENT_AMBULATORY_CARE_PROVIDER_SITE_OTHER): Payer: Managed Care, Other (non HMO)

## 2012-05-26 VITALS — BP 114/66 | HR 68 | Ht 63.0 in | Wt 174.5 lb

## 2012-05-26 DIAGNOSIS — K219 Gastro-esophageal reflux disease without esophagitis: Secondary | ICD-10-CM

## 2012-05-26 LAB — IBC PANEL: Transferrin: 207.7 mg/dL — ABNORMAL LOW (ref 212.0–360.0)

## 2012-05-26 LAB — VITAMIN B12: Vitamin B-12: 661 pg/mL (ref 211–911)

## 2012-05-26 MED ORDER — RABEPRAZOLE SODIUM 20 MG PO TBEC: 20.0000 mg | DELAYED_RELEASE_TABLET | Freq: Every day | ORAL | Status: DC

## 2012-05-26 NOTE — Addendum Note (Signed)
Addended by: Ok Anis A on: 05/26/2012 10:59 AM   Modules accepted: Orders

## 2012-05-26 NOTE — Progress Notes (Signed)
History of Present Illness: This is a 65 year old Caucasian female whose recent developed recurrent episodes of SVT, and she is currently on the care of cardiology and is on Corgard 10 mg a day with when necessary Cardizem.  She denies a current cardiovascular or pulmonary or gastrointestinal issues.  The patient has chronic GERD and does well on twice a day Nexium.  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  She currently denies swallowing difficulties, lower GI or hepatobiliary complaints.  She is up-to-date on her colonoscopy and is due for followup in several years because of a history of adenomas    Current Medications, Allergies, Past Medical History, Past Surgical History, Family History and Social History were reviewed in [employer] record.   Assessment and plan: Chronic GERD doing well on twice a day Aciphex  Her insurance company recently has decreased her dosage because of expense on their part.  We will see how she does a lower dose of AcipHex, but this may need to be increased her previous doses.  The patient currently is on vitamin D replacement, and I will check serum B12 level.  She is in our computer recall system for her colonoscopy followup.  I reviewed a chronic antireflux regime with the patient and her husband. Encounter Diagnosis  Name Primary?  . GERD (gastroesophageal reflux disease) Yes

## 2012-05-26 NOTE — Patient Instructions (Addendum)
Your physician has requested that you go to the basement for lab work before leaving today  We have sent the following medications to your pharmacy for you to pick up at your convenience: Aciphex, please take as directed

## 2012-05-28 ENCOUNTER — Telehealth: Payer: Self-pay | Admitting: Internal Medicine

## 2012-05-28 ENCOUNTER — Other Ambulatory Visit: Payer: Self-pay | Admitting: Gastroenterology

## 2012-05-28 MED ORDER — RABEPRAZOLE SODIUM 20 MG PO TBEC: 20.0000 mg | DELAYED_RELEASE_TABLET | Freq: Every day | ORAL | Status: DC

## 2012-05-28 NOTE — Telephone Encounter (Signed)
Pt calling re ablation due in February, but not ready , will call back when ready

## 2012-05-28 NOTE — Telephone Encounter (Signed)
Spoke with patient and let her know I would not schedule her procedure and she can call me when ready to proceed

## 2012-05-28 NOTE — Telephone Encounter (Signed)
RX sent

## 2012-06-04 ENCOUNTER — Telehealth: Payer: Self-pay | Admitting: Gastroenterology

## 2012-06-04 NOTE — Telephone Encounter (Signed)
Patient said prescription should have been sent for two tablets once a day and it was sent for one tablet by mouth once daily. Patient stated that she does not want to be charged to get the rest of her medication.  Advised patient that I have to call [facility] and see what we can do but I'm not sure how policy works. ______________________________________________________________________________________________________________________  I called [facility] at [phone].Marland KitchenPer Victorino Dike medication was already mailed three days ago for 90 tablets but I explained it should have been 180 tablets, is there a way to get patient rest of medication without charging patient.  Per Victorino Dike if prescription was not already mailed out then the cost could have been prevented but because medication was already sent patient will have to pay for another refill.  I called patient back and explained that she since medication was mailed she will have to pay for further medication. Patient stated that she should not have to and our office should pay her $16 copay because the phone message was taking incorrectly and not her fault  Patient stated that she would like to speak to a supervisor in the office because she wants somebody to pay.  Routed to Darcey Nora, Charity fundraiser.

## 2012-06-04 NOTE — Telephone Encounter (Signed)
I spoke with the patient she thinks that Dr. Jarold Motto misunderstood her about the aciphex, she didn't want the dose changed just the generic rx sent in.  I have advised the patient that we will contact a rep for some samples and I will call her back.

## 2012-06-05 MED ORDER — RABEPRAZOLE SODIUM 20 MG PO TBEC: 20.0000 mg | DELAYED_RELEASE_TABLET | Freq: Two times a day (BID) | ORAL | Status: DC

## 2012-06-05 NOTE — Telephone Encounter (Signed)
The rx was sent to her mail order pharmacy incorrectly x 2.  She does not feel she should have to pay for the co-pay to get the correct amount of RX.  Per Dr. Jarold Motto ok to return to BID generic aciphex.  I have [facility]  She will get me a copy of her bill and I will arrange for a refund check to be cut for her.  She will fax it to me next week.  I have advised her that it usually takes about 2 weeks to get a check cut and I will contact her when it is ready.  I have apologized for all of her inconvenience.

## 2012-06-10 NOTE — Telephone Encounter (Signed)
I received the fax from the patient.  Darden Palmer our unit secretary has requested a check for the patient.  I have left her a message for the patient that she should receive a check from [facility].  She is asked to call me back for any questions or concerns

## 2012-07-11 ENCOUNTER — Other Ambulatory Visit: Payer: Self-pay

## 2012-07-14 ENCOUNTER — Encounter: Payer: Self-pay | Admitting: Internal Medicine

## 2012-07-18 ENCOUNTER — Other Ambulatory Visit: Payer: Self-pay | Admitting: Gastroenterology

## 2012-07-23 ENCOUNTER — Encounter: Payer: Self-pay | Admitting: Internal Medicine

## 2012-07-23 ENCOUNTER — Other Ambulatory Visit: Payer: Self-pay | Admitting: Cardiology

## 2012-07-23 MED ORDER — NADOLOL 20 MG PO TABS: 10.0000 mg | ORAL_TABLET | Freq: Every day | ORAL | Status: DC

## 2012-07-24 ENCOUNTER — Other Ambulatory Visit: Payer: Self-pay | Admitting: *Deleted

## 2012-07-24 MED ORDER — NADOLOL 20 MG PO TABS: ORAL_TABLET | ORAL | Status: DC

## 2012-08-25 ENCOUNTER — Ambulatory Visit (INDEPENDENT_AMBULATORY_CARE_PROVIDER_SITE_OTHER): Payer: BLUE CROSS/BLUE SHIELD | Admitting: Internal Medicine

## 2012-08-25 ENCOUNTER — Encounter: Payer: Self-pay | Admitting: Internal Medicine

## 2012-08-25 VITALS — BP 118/86 | HR 68 | Temp 98.0°F | Resp 14 | Ht 63.03 in | Wt 175.0 lb

## 2012-08-25 DIAGNOSIS — Z0000 Encounter for general adult medical examination without abnormal findings: Secondary | ICD-10-CM

## 2012-08-25 DIAGNOSIS — Z86010 Personal history of colonic polyps: Secondary | ICD-10-CM

## 2012-08-25 DIAGNOSIS — I498 Other specified cardiac arrhythmias: Secondary | ICD-10-CM

## 2012-08-25 DIAGNOSIS — I471 Supraventricular tachycardia: Secondary | ICD-10-CM

## 2012-08-25 LAB — CBC WITH DIFFERENTIAL/PLATELET
Basophils Relative: 0.4 % (ref 0.0–3.0)
Eosinophils Relative: 1.2 % (ref 0.0–5.0)
HCT: 39.7 % (ref 36.0–46.0)
Lymphs Abs: 1.9 10*3/uL (ref 0.7–4.0)
MCV: 86.9 fl (ref 78.0–100.0)
Monocytes Absolute: 0.3 10*3/uL (ref 0.1–1.0)
Neutro Abs: 2.5 10*3/uL (ref 1.4–7.7)
RBC: 4.57 Mil/uL (ref 3.87–5.11)
WBC: 4.9 10*3/uL (ref 4.5–10.5)

## 2012-08-25 LAB — HEPATIC FUNCTION PANEL
ALT: 16 U/L (ref 0–35)
AST: 17 U/L (ref 0–37)
Albumin: 4.1 g/dL (ref 3.5–5.2)

## 2012-08-25 LAB — BASIC METABOLIC PANEL
BUN: 15 mg/dL (ref 6–23)
Chloride: 106 mEq/L (ref 96–112)
Potassium: 3.9 mEq/L (ref 3.5–5.1)

## 2012-08-25 LAB — T4, FREE: Free T4: 1.02 ng/dL (ref 0.60–1.60)

## 2012-08-25 LAB — LDL CHOLESTEROL, DIRECT: Direct LDL: 135.1 mg/dL

## 2012-08-25 LAB — LIPID PANEL: Cholesterol: 202 mg/dL — ABNORMAL HIGH (ref 0–200)

## 2012-08-25 NOTE — Patient Instructions (Addendum)
Preventive Health Care: Exercise  30-45  minutes a day, 3-4 days a week. Walking is especially valuable in preventing Osteoporosis. Eat a low-fat diet with lots of fruits and vegetables, up to 7-9 servings per day. This would eliminate need for vitamin supplements for most individuals. Consume less than 30 grams of sugar per day from foods & drinks with High Fructose Corn Syrup as #2,3 or #4 on label. The legal document "Health Care Power of Attorney & Living Will " verifies decisions concerning your health care.

## 2012-08-25 NOTE — Progress Notes (Signed)
  Subjective:    Patient ID: Toni [name], female    DOB: 04/05/1946, 67 y.o.   MRN: [id number]  HPI She  is here for a physical;acute issues include recurrent dysrrhythmias.     Review of Systems  She's had 3 separate episodes of supraventricular tachycardia requiring emergency room care in the last year. She is being monitored by Dr. Johney Frame.     Objective:   Physical Exam Gen.: Well-nourished in appearance. Alert, appropriate and cooperative throughout exam. Head: Normocephalic without obvious abnormalities  Eyes: No corneal or conjunctival inflammation noted. Pupils equal round reactive to light and accommodation. Extraocular motion intact. Vision grossly decreased without lenses Ears: External  ear exam reveals no significant lesions or deformities. Canals clear .TMs normal. Hearing is grossly normal bilaterally. Nose: External nasal exam reveals no deformity or inflammation. Nasal mucosa are pink and moist. No lesions or exudates noted.   Mouth: Oral mucosa and oropharynx reveal no lesions or exudates. Teeth in good repair. Neck: No deformities, masses, or tenderness noted. Range of motion & Thyroid normal. Lungs: Normal respiratory effort; chest expands symmetrically. Lungs are clear to auscultation without rales, wheezes, or increased work of breathing. Heart: Normal rate and rhythm. Normal S1 and S2. No gallop, click, or rub. Grade 1/6 systolic murmur. Abdomen: Bowel sounds normal; abdomen soft and nontender. No masses, organomegaly or hernias noted. Genitalia: As per Gyn, Dr Seymour Bars                                  Musculoskeletal/extremities: No deformity or scoliosis noted of  the thoracic or lumbar spine.  No clubbing, cyanosis, edema, or significant extremity  deformity noted. Range of motion normal .Tone & strength  Normal. Joints normal . Nail health good. Able to lie down & sit up w/o help. Negative SLR bilaterally Vascular: Carotid, radial artery, dorsalis pedis and   posterior tibial pulses are full and equal. No bruits present. Neurologic: Alert and oriented x3. Deep tendon reflexes symmetrical and normal.    Skin: Intact without suspicious lesions or rashes. Lymph: No cervical, axillary lymphadenopathy present. Psych: Mood and affect are normal. Normally interactive                                                                                        Assessment & Plan:  #1 comprehensive physical exam; no acute findings  Plan: see Orders  & Recommendations

## 2012-09-28 ENCOUNTER — Other Ambulatory Visit: Payer: Self-pay | Admitting: *Deleted

## 2012-09-28 MED ORDER — POTASSIUM CHLORIDE CRYS ER 10 MEQ PO TBCR: 10.0000 meq | EXTENDED_RELEASE_TABLET | Freq: Every day | ORAL | Status: DC

## 2012-11-09 ENCOUNTER — Other Ambulatory Visit: Payer: Self-pay | Admitting: *Deleted

## 2012-11-09 MED ORDER — NADOLOL 20 MG PO TABS: ORAL_TABLET | ORAL | Status: DC

## 2012-12-30 ENCOUNTER — Other Ambulatory Visit: Payer: Self-pay

## 2013-01-04 ENCOUNTER — Ambulatory Visit (INDEPENDENT_AMBULATORY_CARE_PROVIDER_SITE_OTHER): Payer: Managed Care, Other (non HMO) | Admitting: Ophthalmology

## 2013-02-22 ENCOUNTER — Ambulatory Visit: Payer: BLUE CROSS/BLUE SHIELD | Admitting: Internal Medicine

## 2013-03-09 ENCOUNTER — Other Ambulatory Visit: Payer: Self-pay | Admitting: Gastroenterology

## 2013-03-10 MED ORDER — RABEPRAZOLE SODIUM 20 MG PO TBEC: 20.0000 mg | DELAYED_RELEASE_TABLET | Freq: Two times a day (BID) | ORAL | Status: DC

## 2013-04-01 ENCOUNTER — Other Ambulatory Visit: Payer: Self-pay

## 2013-04-05 ENCOUNTER — Encounter: Payer: Self-pay | Admitting: Internal Medicine

## 2013-04-05 ENCOUNTER — Ambulatory Visit (INDEPENDENT_AMBULATORY_CARE_PROVIDER_SITE_OTHER): Payer: BC Managed Care – PPO | Admitting: Internal Medicine

## 2013-04-05 VITALS — BP 112/72 | HR 70 | Ht 63.0 in | Wt 180.4 lb

## 2013-04-05 DIAGNOSIS — R0602 Shortness of breath: Secondary | ICD-10-CM | POA: Insufficient documentation

## 2013-04-05 DIAGNOSIS — I471 Supraventricular tachycardia: Secondary | ICD-10-CM

## 2013-04-05 DIAGNOSIS — R0609 Other forms of dyspnea: Secondary | ICD-10-CM | POA: Insufficient documentation

## 2013-04-05 DIAGNOSIS — I4891 Unspecified atrial fibrillation: Secondary | ICD-10-CM

## 2013-04-05 DIAGNOSIS — I498 Other specified cardiac arrhythmias: Secondary | ICD-10-CM

## 2013-04-05 NOTE — Patient Instructions (Signed)
Your physician wants you to follow-up in: 12 months with Dr Jacquiline Doe will receive a reminder letter in the mail two months in advance. If you don't receive a letter, please call our office to schedule the follow-up appointment.   Your physician has requested that you have an exercise tolerance test. For further information please visit [email or web address] Please also follow instruction sheet, as given.

## 2013-04-05 NOTE — Progress Notes (Signed)
PCP:  Marga Melnick, MD  The patient presents today for routine electrophysiology followup. She has had no SVT over the past year.  Her concern today is with SOB.  She reports dyspnea with moderate activity such as one flight of stairs..  Today, she denies symptoms of chest pain orthopnea, PND, lower extremity edema, dizziness, presyncope, syncope, or neurologic sequela.  The patient feels that she is tolerating medications without difficulties and is otherwise without complaint today.   Past Medical History  Diagnosis Date  . Aortic sclerosis 2000    on ECHO  . Stricture and stenosis of esophagus     dilation X 3  . Esophageal reflux   . Diverticulosis of colon (without mention of hemorrhage)   . Colon polyps     Dr Jarold Motto  . Hyperlipidemia 1999    LDL 158, resolved with diet  . Eye abnormality     OD ocular freckle  . SVT (supraventricular tachycardia)     short RP , adenosine sensitive  . Premature ventricular contractions    Past Surgical History  Procedure Laterality Date  . Ovarian wedge resection      to allow pregnancy  . Knee arthroscopy      bilateral  . Ankle surgery      right  . Tonsillectomy    . Colonoscopy w/ polypectomy  2011    Dr Jarold Motto    Current Outpatient Prescriptions  Medication Sig Dispense Refill  . aspirin 81 MG tablet Take 81 mg by mouth daily.        Marland Kitchen CALCIUM CITRATE PO Take 1 tablet by mouth daily.       . cholecalciferol (VITAMIN D) 1000 UNITS tablet Take 1,000 Units by mouth daily.        Marland Kitchen diltiazem (CARDIZEM) 60 MG tablet Take 1 every 6 hours as needed  30 tablet  3  . Multiple Vitamin (MULTIVITAMIN WITH MINERALS) TABS Take 1 tablet by mouth daily.      . multivitamin-lutein (OCUVITE-LUTEIN) CAPS Take 1 capsule by mouth daily.      . nadolol (CORGARD) 20 MG tablet Take 1/2 tablet daily      . Omega-3 Fatty Acids (FISH OIL) 1000 MG CAPS Take 1 capsule by mouth daily.        . potassium chloride (KLOR-CON M10) 10 MEQ tablet  Take 1 tablet (10 mEq total) by mouth daily.  90 tablet  3  . RABEprazole (ACIPHEX) 20 MG tablet Take 1 tablet (20 mg total) by mouth 2 (two) times daily.  180 tablet  3  . VITAMIN E PO Take 1 tablet by mouth daily.       No current facility-administered medications for this visit.    Allergies  Allergen Reactions  . Dexlansoprazole     REACTION: pressure in ears  . Lansoprazole     REACTION: pressure in ears  . Omeprazole     REACTION: pressure in ears    History   Social History  . Marital Status: Married    Spouse Name: N/A    Number of Children: 2  . Years of Education: N/A   Occupational History  . Diplomatic Services operational officer   .     Social History Main Topics  . Smoking status: Never Smoker   . Smokeless tobacco: Never Used  . Alcohol Use: Yes     Comment: socially, once / month  . Drug Use: No  . Sexual Activity: Not on file   Other Topics Concern  .  Not on file   Social History Narrative  . No narrative on file    Family History  Problem Relation Age of Onset  . Stroke Father 92  . Arthritis Mother     OA  . Hypertension Mother   . Hypothyroidism Mother   . Hypothyroidism Sister   . Heart attack Sister 60    smoker  . Osteopenia Sister   . Ovarian cancer Sister   . Hypertrophic cardiomyopathy Sister   . Diabetes Maternal Grandmother   . Stroke Mother 47    ischemic    ROS-  All systems are reviewed and are negative except as outlined in the HPI above   Physical Exam: Filed Vitals:   04/05/13 1428  BP: 112/72  Pulse: 70  Height: 5\' 3"  (1.6 m)  Weight: 180 lb 6.4 oz (81.829 kg)    GEN- The patient is well appearing, alert and oriented x 3 today.   Head- normocephalic, atraumatic Eyes-  Sclera clear, conjunctiva pink Ears- hearing intact Oropharynx- clear Neck- supple, no JVP Lymph- no cervical lymphadenopathy Lungs- Clear to ausculation bilaterally, normal work of breathing Heart- Regular rate and rhythm, no murmurs, rubs or gallops, PMI not  laterally displaced GI- soft, NT, ND, + BS Extremities- no clubbing, cyanosis, or edema MS- no significant deformity or atrophy Skin- no rash or lesion Psych- euthymic mood, full affect Neuro- strength and sensation are intact  ekg today reveals sinus rhythm 70 bpm, septal infarct, otherwise normal ekg  Assessment and Plan:  1. SOB Unclear etiology She is concerned about CAD Lipids from 4/14 are reviewed I will order GXT for further CV risk stratification.  If no ischemic changes, will plan to continue lifestyle modification with regular exercise and weight reduction  2. Adenosine sensitive SVT She prefers to continue medical therapy with nadolol Return in 1 year

## 2013-04-06 ENCOUNTER — Other Ambulatory Visit (HOSPITAL_COMMUNITY): Payer: Self-pay | Admitting: Obstetrics & Gynecology

## 2013-04-06 DIAGNOSIS — Z1231 Encounter for screening mammogram for malignant neoplasm of breast: Secondary | ICD-10-CM

## 2013-04-16 ENCOUNTER — Encounter: Payer: Self-pay | Admitting: Internal Medicine

## 2013-05-06 ENCOUNTER — Telehealth: Payer: Self-pay | Admitting: Gastroenterology

## 2013-05-06 MED ORDER — RABEPRAZOLE SODIUM 20 MG PO TBEC: 20.0000 mg | DELAYED_RELEASE_TABLET | Freq: Two times a day (BID) | ORAL | Status: DC

## 2013-05-06 NOTE — Telephone Encounter (Signed)
RX was sent in October RX sent again

## 2013-05-13 ENCOUNTER — Ambulatory Visit (INDEPENDENT_AMBULATORY_CARE_PROVIDER_SITE_OTHER): Payer: Medicare Other | Admitting: Physician Assistant

## 2013-05-13 DIAGNOSIS — R0602 Shortness of breath: Secondary | ICD-10-CM

## 2013-05-13 NOTE — Progress Notes (Signed)
Exercise Treadmill Test  Pre-Exercise Testing Evaluation Rhythm: normal sinus  Rate: 79 bpm     Test  Exercise Tolerance Test Ordering MD: Hillis Range, MD  Interpreting MD: Tereso Newcomer, PA-C  Unique Test No: 1  Treadmill:  1  Indication for ETT: exertional dyspnea  Contraindication to ETT: No   Stress Modality: exercise - treadmill  Cardiac Imaging Performed: non   Protocol: standard Bruce - maximal  Max BP:  187/61  Max MPHR (bpm):  155 85% MPR (bpm):  132  MPHR obtained (bpm):  150 % MPHR obtained:  96  Reached 85% MPHR (min:sec):  4:15 Total Exercise Time (min-sec):  7:00  Workload in METS:  8.5 Borg Scale: 15  Reason ETT Terminated:  patient's desire to stop    ST Segment Analysis At Rest: non-specific ST segment slurring With Exercise: non-specific ST changes  Other Information Arrhythmia:  No Angina during ETT:  absent (0) Quality of ETT:  diagnostic  ETT Interpretation:  normal - no evidence of ischemia by ST analysis  Comments: Good exercise capacity. No chest pain. Normal BP response to exercise. Insignificant up-sloping ST depression.  No significant ST changes to suggest ischemia.   Recommendations: F/u with Dr. Hillis Range as directed. Signed,  Tereso Newcomer, PA-C   05/13/2013 3:23 PM

## 2013-05-14 ENCOUNTER — Ambulatory Visit (HOSPITAL_COMMUNITY): Payer: BC Managed Care – PPO

## 2013-05-17 ENCOUNTER — Emergency Department (HOSPITAL_COMMUNITY): Payer: BC Managed Care – PPO

## 2013-05-17 ENCOUNTER — Encounter (HOSPITAL_COMMUNITY): Payer: Self-pay | Admitting: Emergency Medicine

## 2013-05-17 ENCOUNTER — Emergency Department (HOSPITAL_COMMUNITY)
Admission: EM | Admit: 2013-05-17 | Discharge: 2013-05-17 | Disposition: A | Discharge status: Home / Self Care | Payer: BC Managed Care – PPO | Attending: Emergency Medicine | Admitting: Emergency Medicine

## 2013-05-17 DIAGNOSIS — Z7982 Long term (current) use of aspirin: Secondary | ICD-10-CM | POA: Insufficient documentation

## 2013-05-17 DIAGNOSIS — I471 Supraventricular tachycardia: Secondary | ICD-10-CM

## 2013-05-17 DIAGNOSIS — E785 Hyperlipidemia, unspecified: Secondary | ICD-10-CM | POA: Insufficient documentation

## 2013-05-17 DIAGNOSIS — I700 Atherosclerosis of aorta: Secondary | ICD-10-CM | POA: Insufficient documentation

## 2013-05-17 DIAGNOSIS — R002 Palpitations: Secondary | ICD-10-CM | POA: Insufficient documentation

## 2013-05-17 DIAGNOSIS — Z8719 Personal history of other diseases of the digestive system: Secondary | ICD-10-CM | POA: Insufficient documentation

## 2013-05-17 DIAGNOSIS — R000 Tachycardia, unspecified: Secondary | ICD-10-CM | POA: Insufficient documentation

## 2013-05-17 DIAGNOSIS — I498 Other specified cardiac arrhythmias: Secondary | ICD-10-CM | POA: Insufficient documentation

## 2013-05-17 DIAGNOSIS — Z79899 Other long term (current) drug therapy: Secondary | ICD-10-CM | POA: Insufficient documentation

## 2013-05-17 LAB — BASIC METABOLIC PANEL
BUN: 18 mg/dL (ref 6–23)
Calcium: 9 mg/dL (ref 8.4–10.5)
GFR calc Af Amer: >90 mL/min (ref >90)
GFR calc non Af Amer: 88 mL/min — ABNORMAL LOW (ref >90)
Glucose, Bld: 117 mg/dL — ABNORMAL HIGH (ref 70–99)
Sodium: 136 mEq/L (ref 135–145)

## 2013-05-17 LAB — CBC
HCT: 42.3 % (ref 36.0–46.0)
Hemoglobin: 14.7 g/dL (ref 12.0–15.0)
MCH: 30 pg (ref 26.0–34.0)
MCHC: 34.8 g/dL (ref 30.0–36.0)

## 2013-05-17 LAB — POCT I-STAT TROPONIN I: Troponin i, poc: 0 ng/mL (ref 0.00–0.08)

## 2013-05-17 MED ORDER — ADENOSINE 6 MG/2ML IV SOLN
INTRAVENOUS | Status: AC
  Filled 2013-05-17: qty 2

## 2013-05-17 MED ORDER — ADENOSINE 6 MG/2ML IV SOLN: 6.0000 mg | Freq: Once | INTRAVENOUS | Status: DC

## 2013-05-17 MED ORDER — SODIUM CHLORIDE 0.9 % IV BOLUS (SEPSIS)
1000.0000 mL | Freq: Once | INTRAVENOUS | Status: AC
  Administered 2013-05-17: 1000 mL via INTRAVENOUS

## 2013-05-17 MED ORDER — ADENOSINE 6 MG/2ML IV SOLN
6.0000 mg | Freq: Once | INTRAVENOUS | Status: AC
  Administered 2013-05-17: 6 mg via INTRAVENOUS

## 2013-05-17 MED ORDER — ADENOSINE 6 MG/2ML IV SOLN
INTRAVENOUS | Status: DC
Start: 2013-05-17 — End: 2013-05-17
  Filled 2013-05-17: qty 6

## 2013-05-17 NOTE — ED Provider Notes (Signed)
CSN: [id number]     Arrival date & time 05/17/13  1321 History   First MD Initiated Contact with Patient 05/17/13 1341     Chief Complaint  Patient presents with  . Chest Pain   (Consider location/radiation/quality/duration/timing/severity/associated sxs/prior Treatment) HPI Comments: 67 year old female presents with recurrent SVT. She states that our goals are with chest pressure in the surgeon a headache and palpitations. She's had SVT multiple times before but has not had at least 14 months. She states that currently she denies any chest pain but does feel palpitations. She tried to tie them but this does not help. She is followed by Dr. Johney Frame of cardiology. She had 3 episodes per year before, they were talking about an ablation but then her SVT stopped. She denies any new symptoms. She has been recently diagnosed with a viral URI but has been asymptomatic for a few days. She denies any new caffeine intake. She drank her normal 2 cups of coffee, but is not different than normal. She has not missed her nadolol recently.   Past Medical History  Diagnosis Date  . Aortic sclerosis 2000    on ECHO  . Stricture and stenosis of esophagus     dilation X 3  . Esophageal reflux   . Diverticulosis of colon (without mention of hemorrhage)   . Colon polyps     Dr Jarold Motto  . Hyperlipidemia 1999    LDL 158, resolved with diet  . Eye abnormality     OD ocular freckle  . SVT (supraventricular tachycardia)     short RP , adenosine sensitive  . Premature ventricular contractions    Past Surgical History  Procedure Laterality Date  . Ovarian wedge resection      to allow pregnancy  . Knee arthroscopy      bilateral  . Ankle surgery      right  . Tonsillectomy    . Colonoscopy w/ polypectomy  2011    Dr Jarold Motto   Family History  Problem Relation Age of Onset  . Stroke Father 17  . Arthritis Mother     OA  . Hypertension Mother   . Hypothyroidism Mother   . Hypothyroidism Sister    . Heart attack Sister 53    smoker  . Osteopenia Sister   . Ovarian cancer Sister   . Hypertrophic cardiomyopathy Sister   . Diabetes Maternal Grandmother   . Stroke Mother 2    ischemic   History  Substance Use Topics  . Smoking status: Never Smoker   . Smokeless tobacco: Never Used  . Alcohol Use: Yes     Comment: socially, once / month   OB History   Grav Para Term Preterm Abortions TAB SAB Ect Mult Living                 Review of Systems  Constitutional: Negative for fever and chills.  Respiratory: Negative for cough and shortness of breath.   Cardiovascular: Positive for chest pain and palpitations.  Gastrointestinal: Negative for vomiting, abdominal pain and diarrhea.  Neurological: Negative for weakness.  All other systems reviewed and are negative.    Allergies  Dexlansoprazole; Lansoprazole; and Omeprazole  Home Medications   Current Outpatient Rx  Name  Route  Sig  Dispense  Refill  . aspirin 81 MG tablet   Oral   Take 81 mg by mouth daily.           Marland Kitchen CALCIUM CITRATE PO   Oral  Take 1 tablet by mouth daily.          . cholecalciferol (VITAMIN D) 1000 UNITS tablet   Oral   Take 1,000 Units by mouth daily.           Marland Kitchen diltiazem (CARDIZEM) 60 MG tablet      Take 1 every 6 hours as needed   30 tablet   3   . Multiple Vitamin (MULTIVITAMIN WITH MINERALS) TABS   Oral   Take 1 tablet by mouth daily.         . multivitamin-lutein (OCUVITE-LUTEIN) CAPS   Oral   Take 1 capsule by mouth daily.         . nadolol (CORGARD) 20 MG tablet      Take 1/2 tablet daily         . Omega-3 Fatty Acids (FISH OIL) 1000 MG CAPS   Oral   Take 1 capsule by mouth daily.           . potassium chloride (KLOR-CON M10) 10 MEQ tablet   Oral   Take 1 tablet (10 mEq total) by mouth daily.   90 tablet   3   . RABEprazole (ACIPHEX) 20 MG tablet   Oral   Take 1 tablet (20 mg total) by mouth 2 (two) times daily.   180 tablet   3   . VITAMIN E  PO   Oral   Take 1 tablet by mouth daily.          BP 129/84  Temp(Src) 97.5 F (36.4 C) (Oral)  Resp 20  Ht 5\' 3"  (1.6 m)  Wt 177 lb (80.287 kg)  BMI 31.36 kg/m2  SpO2 96% Physical Exam  Nursing note and vitals reviewed. Constitutional: She is oriented to person, place, and time. She appears well-developed and well-nourished. No distress.  HENT:  Head: Normocephalic and atraumatic.  Right Ear: External ear normal.  Left Ear: External ear normal.  Nose: Nose normal.  Eyes: Right eye exhibits no discharge. Left eye exhibits no discharge.  Cardiovascular: Regular rhythm and normal heart sounds.  Tachycardia present.   Pulmonary/Chest: Effort normal and breath sounds normal.  Abdominal: Soft. There is no tenderness.  Musculoskeletal: She exhibits no edema.  Neurological: She is alert and oriented to person, place, and time.  Skin: Skin is warm and dry.    ED Course  CARDIOVERSION Performed by: Audree Camel Authorized by: Pricilla Loveless T Consent: Verbal consent obtained. Patient sedated: no Cardioversion basis: emergent Pre-procedure rhythm: supraventricular tachycardia Patient position: patient was placed in a supine position Chest area: chest area exposed Number of attempts: 1 Cardioversion mode attempt one: adenosine. Attempt 1 outcome: conversion to normal sinus rhythm Post-procedure rhythm: normal sinus rhythm Complications: no complications Patient tolerance: Patient tolerated the procedure well with no immediate complications. Comments: Converted with 1 dose of 6 mg adenosine   (including critical care time) Labs Review Labs Reviewed  CBC - Abnormal; Notable for the following:    WBC 11.8 (*)    All other components within normal limits  BASIC METABOLIC PANEL - Abnormal; Notable for the following:    Glucose, Bld 117 (*)    GFR calc non Af Amer 88 (*)    All other components within normal limits  MAGNESIUM  POCT I-STAT TROPONIN I   Imaging  Review Dg Chest Portable 1 View  05/17/2013   CLINICAL DATA:  Supraventricular tachycardia  EXAM: PORTABLE CHEST - 1 VIEW  COMPARISON:  April 01, 2012  FINDINGS: There is no edema or consolidation. Heart size and pulmonary vascularity are normal. No adenopathy. No pneumothorax. No bone lesions.  IMPRESSION: No abnormality noted.   Electronically Signed   By: Bretta Bang M.D.   On: 05/17/2013 14:07    EKG Interpretation    Date/Time:  Monday May 17 2013 13:27:33 EST Ventricular Rate:  174 PR Interval:    QRS Duration: 84 QT Interval:  260 QTC Calculation: 442 R Axis:   -3 Text Interpretation:  Supraventricular tachycardia with Fusion complexes Nonspecific ST abnormality Abnormal ECG Confirmed by Tristan Bramble  MD, Georgia Delsignore ([id number]) on 05/17/2013 2:43:30 PM            MDM   1. SVT (supraventricular tachycardia)    67 year old female with recurrent SVT. Converted to normal sinus rhythm with one dose of 6 mg adenosine. Patient asymptomatic after conversion. Cardiology consulted, they recommend discharge and outpatient cards f/u.     Audree Camel, MD 05/17/13 [phone]

## 2013-05-17 NOTE — Consult Note (Signed)
 ELECTROPHYSIOLOGY CONSULT NOTE  Patient ID: Toni [name] MRN: [id number], DOB/AGE: 06/19/1945   Admit date: 05/17/2013 Date of Consult: 05/17/2013  Primary Physician: William Hopper, MD Primary Cardiologist: James Allred, MD Reason for Consultation: SVT  History of Present Illness Toni [name] is a 67 y.o. female with PSVT / short RP tachycardia who is followed by Dr. Allred. She is taking nadolol. She has deferred EPS +RF ablation in the past because her SVT was improving, becoming less frequent. She has not had any recurrence since Nov 2013. This AM, while at home, she experienced the sudden onset of racing palpitations accompanied by lightheadedness and chest "pressure." She denies SOB or syncope. She recognized her symptoms right away as her usual symptom with her SVT. She was not able to terminate the tachycardia with vagal maneuvers so she came to the ED. Here a 12-lead ECG showed a narrow complex tachycardia at 174 bpm, short RP tachycardia. She was given adenosine and converted to SR. She is hemodynamically stable. She reports compliance with her medications and denies any recent changes. She denies recent illness or fever. She denies excess caffeine intake.      Past Medical History Past Medical History  Diagnosis Date  . Aortic sclerosis 2000    on ECHO  . Stricture and stenosis of esophagus     dilation X 3  . Esophageal reflux   . Diverticulosis of colon (without mention of hemorrhage)   . Colon polyps     Dr Patterson  . Hyperlipidemia 1999    LDL 158, resolved with diet  . Eye abnormality     OD ocular freckle  . SVT (supraventricular tachycardia)     short RP , adenosine sensitive  . Premature ventricular contractions     Past Surgical History Past Surgical History  Procedure Laterality Date  . Ovarian wedge resection      to allow pregnancy  . Knee arthroscopy      bilateral  . Ankle surgery      right  . Tonsillectomy    . Colonoscopy w/  polypectomy  2011    Dr Patterson    Allergies/Intolerances Allergies  Allergen Reactions  . Dexlansoprazole     REACTION: pressure in ears  . Lansoprazole     REACTION: pressure in ears  . Omeprazole     REACTION: pressure in ears    Current Home Medications   Medication List    ASK your doctor about these medications       aspirin 81 MG chewable tablet  Chew 81 mg by mouth daily.     aspirin-acetaminophen-caffeine 250-250-65 MG per tablet  Commonly known as:  EXCEDRIN MIGRAINE  Take 1 tablet by mouth every 6 (six) hours as needed for headache.     CALCIUM CITRATE PO  Take 1 tablet by mouth daily.     cholecalciferol 1000 UNITS tablet  Commonly known as:  VITAMIN D  Take 1,000 Units by mouth daily.     diltiazem 60 MG tablet  Commonly known as:  CARDIZEM  Take 60 mg by mouth 4 (four) times daily as needed (when heart races).     Fish Oil 1000 MG Caps  Take 1 capsule by mouth daily.     multivitamin with minerals Tabs tablet  Take 1 tablet by mouth daily.     nadolol 20 MG tablet  Commonly known as:  CORGARD  Take 10 mg by mouth daily.     potassium chloride 10 MEQ   tablet  Commonly known as:  KLOR-CON M10  Take 1 tablet (10 mEq total) by mouth daily.     RABEprazole 20 MG tablet  Commonly known as:  ACIPHEX  Take 1 tablet (20 mg total) by mouth 2 (two) times daily.     VITAMIN E PO  Take 1 tablet by mouth daily.       Family History Family History  Problem Relation Age of Onset  . Stroke Father 80  . Arthritis Mother     OA  . Hypertension Mother   . Hypothyroidism Mother   . Hypothyroidism Sister   . Heart attack Sister 63    smoker  . Osteopenia Sister   . Ovarian cancer Sister   . Hypertrophic cardiomyopathy Sister   . Diabetes Maternal Grandmother   . Stroke Mother 95    ischemic     Social History History   Social History  . Marital Status: Married    Spouse Name: N/A    Number of Children: 2  . Years of Education: N/A    Occupational History  . secretary   .     Social History Main Topics  . Smoking status: Never Smoker   . Smokeless tobacco: Never Used  . Alcohol Use: Yes     Comment: socially, once / month  . Drug Use: No  . Sexual Activity: Not on file   Other Topics Concern  . Not on file   Social History Narrative  . No narrative on file     Review of Systems General: No chills, fever, night sweats or weight changes  Cardiovascular:  No chest pain, dyspnea on exertion, edema, orthopnea, palpitations, paroxysmal nocturnal dyspnea Dermatological: No rash, lesions or masses Respiratory: No cough, dyspnea Urologic: No hematuria, dysuria Abdominal: No nausea, vomiting, diarrhea, bright red blood per rectum, melena, or hematemesis Neurologic: No visual changes, weakness, changes in mental status All other systems reviewed and are otherwise negative except as noted above.  Physical Exam Vitals: Blood pressure 107/62, pulse 73, temperature 97.5 F (36.4 C), temperature source Oral, resp. rate 10, height 5' 3" (1.6 m), weight 177 lb (80.287 kg), SpO2 100.00%.  General: Well developed, well appearing 67 y.o. female in no acute distress. HEENT: Normocephalic, atraumatic. EOMs intact. Sclera nonicteric. Oropharynx clear.  Neck: Supple. No JVD. Lungs: Respirations regular and unlabored, CTA bilaterally. No wheezes, rales or rhonchi. Heart: RRR. S1, S2 present. No murmurs, rub, S3 or S4. Abdomen: Soft, non-distended.  Extremities: No clubbing, cyanosis or edema. DP/PT/Radials 2+ and equal bilaterally. Psych: Normal affect. Neuro: Alert and oriented X 3. Moves all extremities spontaneously. Musculoskeletal: No kyphosis. Skin: Intact. Warm and dry. No rashes or petechiae in exposed areas.   Labs Lab Results  Component Value Date   WBC 11.8* 05/17/2013   HGB 14.7 05/17/2013   HCT 42.3 05/17/2013   MCV 86.3 05/17/2013   PLT 251 05/17/2013    Recent Labs Lab 05/17/13 1342  NA 136  K 3.7   CL 100  CO2 24  BUN 18  CREATININE 0.72  CALCIUM 9.0  GLUCOSE 117*   Lab Results  Component Value Date   CHOL 202* 08/25/2012   HDL 54.40 08/25/2012   LDLCALC 114* 04/12/2010   TRIG 94.0 08/25/2012    Radiology/Studies Dg Chest Portable 1 View 05/17/2013   CLINICAL DATA:  Supraventricular tachycardia  EXAM: PORTABLE CHEST - 1 VIEW  COMPARISON:  April 01, 2012  FINDINGS: There is no edema or consolidation. Heart size and   pulmonary vascularity are normal. No adenopathy. No pneumothorax. No bone lesions.   IMPRESSION: No abnormality noted.    Electronically Signed   By: William  Woodruff M.D.   On: 05/17/2013 14:07   Echocardiogram March 2013 Study Conclusions - Left ventricle: The cavity size was normal. Wall thickness was increased in a pattern of mild LVH. Systolic function was normal. The estimated ejection fraction was in the range of 60% to 65%. Wall motion was normal; there were no regional wall motion abnormalities. - Mitral valve: Mild regurgitation. - Left atrium: The atrium was mildly dilated. - Atrial septum: No defect or patent foramen ovale was identified.  12-lead ECG - narrow complex tachycardia / SVT at 174 bpm Telemetry - currently NSR  Assessment and Plan 1. SVT - now back in SR; symptoms resolved; hemodynamically stable; first recurrence in one year - adenosine sensitive - discussed treatment options including medical therapy and possible EPS +RF ablation; for now, will continue medical therapy and follow-up with Dr. Allred as an outpatient to discuss possible EPS  Dr. Dameka Younker to see Signed, EDMISTEN, BROOKE, PA-C 05/17/2013, 4:27 PM   EP Attending  Patient seen and examined. I have reviewed the history as noted above and have made amendments as needed. She has a h/o SVT and presents with another episode. She was treated with IV adenosine and returned to NSR. She has a short RP tachycardia. I have recommended she be allowed to be discharged home and  follow up for an ablation with Dr. Allred her primary EP MD. She is instructed to avoid caffiene, ETOH, and chocolate.   Donnice Nielsen,M.D. 

## 2013-05-17 NOTE — ED Notes (Signed)
Husband in room w/ pt await cards consult

## 2013-05-17 NOTE — ED Notes (Signed)
Cardiology at the bedside.

## 2013-05-17 NOTE — ED Notes (Signed)
Pt states she has a hx of SVT and started about 1215 getting pressure in her head and could feel the palpitations. Took a diltiazem at 1230 to see if it would work. Denies any SOB. Does c/o lightheadedness when the rapid heart rate started.

## 2013-05-18 ENCOUNTER — Ambulatory Visit (HOSPITAL_COMMUNITY): Payer: BC Managed Care – PPO

## 2013-05-24 ENCOUNTER — Telehealth: Payer: Self-pay | Admitting: Internal Medicine

## 2013-05-24 NOTE — Telephone Encounter (Signed)
Follow up    Pt's husband called needing some information and conformation of Ablation and is there anything needing to be done before hand?   Please give him a call in his cell.

## 2013-05-24 NOTE — Telephone Encounter (Signed)
Per husband, pt was seen in ED last week and saw Dr Ladona Ridgel and Nehemiah Settle PA. They were told that pt will be set up for an ablation and a possible date may be 06/03/13 but nothing was set at DC from ED. Pt would like a call back tomorrow from [employer]. I will forward to [facility] [place]/ep scheduler to also follow.

## 2013-05-25 ENCOUNTER — Other Ambulatory Visit: Payer: Self-pay | Admitting: *Deleted

## 2013-05-25 NOTE — Telephone Encounter (Signed)
spoke with patient and she is going to have the ablation on 06/08/13.  Dr Johney Frame aware and agrees.

## 2013-05-25 NOTE — Telephone Encounter (Signed)
Toni [name] aware Anesthesia needed.  Patient will have labs day off at the hospital.  Will be at [facility] Entrance at 5:30am for a 7:30am case  NPO after mid night and will hold her Nadol for 48 hours prior.

## 2013-05-25 NOTE — Telephone Encounter (Signed)
Left message for patient to call me back to discuss.

## 2013-05-25 NOTE — Telephone Encounter (Signed)
Follow up     Want to verify date for ablation

## 2013-05-27 ENCOUNTER — Telehealth: Payer: Self-pay | Admitting: Nurse Practitioner

## 2013-05-27 NOTE — Telephone Encounter (Signed)
Phone encounter opened unintentionally - Ms Virgie Dad called to discuss her husband.

## 2013-05-28 ENCOUNTER — Telehealth: Payer: Self-pay | Admitting: Internal Medicine

## 2013-05-28 NOTE — Telephone Encounter (Signed)
Called and spoke with patient to let her know that Dr. Linna Darner verbally stated that it was ok to take the medication Cephalexin 500 mg. JG//CMA

## 2013-05-28 NOTE — Telephone Encounter (Signed)
Patient states she has had a sinus infection for 2 1/2 weeks and it's not getting better. Pt has "funny taste" in mouth, facial pain, post nasal drip. She has antibiotic given to her by ENT for salivary infection that she never took. Patient states she will look when she gets home and see what the name of it is, but she would like to know if she can take it. She scheduled appt for Monday if she is not better, but would like to start something today. Patient also has surgery coming up in a couple weeks. Pt prefers to be contacted via MyChart Pt uses [facility] [address].

## 2013-05-28 NOTE — Telephone Encounter (Signed)
Patient called back with name of drug that ENT gave her: Cephalexin 500mg , quantity #15, take 1 tablet 3x a day for 5 days.  Rx'd by Dr. Lucia Gaskins. Patient wants to know if she should take this or if Dr. Linna Darner wants her to take something else.  Please advise.

## 2013-05-28 NOTE — Telephone Encounter (Signed)
Is this ok for her to take for her symptoms?

## 2013-05-28 NOTE — Telephone Encounter (Signed)
Please advise 

## 2013-05-31 ENCOUNTER — Encounter: Payer: Self-pay | Admitting: Internal Medicine

## 2013-05-31 ENCOUNTER — Ambulatory Visit (INDEPENDENT_AMBULATORY_CARE_PROVIDER_SITE_OTHER): Payer: BC Managed Care – PPO | Admitting: Internal Medicine

## 2013-05-31 ENCOUNTER — Other Ambulatory Visit: Payer: Self-pay | Admitting: *Deleted

## 2013-05-31 VITALS — BP 124/77 | HR 68 | Temp 98.5°F | Resp 16 | Wt 177.0 lb

## 2013-05-31 DIAGNOSIS — J019 Acute sinusitis, unspecified: Secondary | ICD-10-CM

## 2013-05-31 MED ORDER — CEFUROXIME AXETIL 500 MG PO TABS: 500.0000 mg | ORAL_TABLET | Freq: Two times a day (BID) | ORAL | Status: DC

## 2013-05-31 NOTE — Progress Notes (Signed)
Pre-visit discussion using our clinic review tool. No additional management support is needed unless otherwise documented below in the visit note.  

## 2013-05-31 NOTE — Progress Notes (Signed)
   Subjective:    Patient ID: Toni [name], female    DOB: 07-20-1945, 68 y.o.   MRN: [id number]  HPI   Her symptoms began 05/10/13 as a "cold". This essentially resolved with OTC meds until she was in the emergency room 05/17/13 for SVT. Since that time she's had head congestion and postnasal drainage with some distasteful character, worse since 1/1. She's also had associated malaise  During this period time she's had a nonproductive cough and some exertional dyspnea.  She does have discomfort in the frontal and maxillary sinuses. She's also pressure in her ears.  She began cephalexin 5 mg 3 times a day on 05/28/12. This prescription had been prescribed for salivary gland infection.   Unrelated is intermittent pain in the left deltoid at the site of the flu shot given 10/14.    Review of Systems She denies any extrinsic symptoms of itchy, watery eyes, or sneezing. She has no fever, chills, or sweats  She has no associated dental pain, sore throat, or otic discharge.  She is not having significant arthralgias or myalgias.      Objective:   Physical Exam  General appearance:good health ;well nourished; no acute distress or increased work of breathing is present.  No  lymphadenopathy about the head, neck, or axilla noted.   Eyes: No conjunctival inflammation or lid edema is present.   Ears:  External ear exam shows no significant lesions or deformities.  Otoscopic examination reveals clear canals, tympanic membranes are intact bilaterally without bulging, retraction, inflammation or discharge.  Nose:  External nasal examination shows no deformity or inflammation. Nasal mucosa are dry without lesions or exudates. No septal dislocation or deviation.No obstruction to airflow.   Oral exam: Dental hygiene is good; lips and gums are healthy appearing.There is no oropharyngeal erythema or exudate noted.   Neck:  No deformities,  masses, or tenderness noted.   Supple with full range of  motion without pain.   Heart:  Normal rate and regular rhythm. S1 and S2 normal without gallop, murmur, click, rub or other extra sounds. S4  Lungs:Chest clear to auscultation; no wheezes, rhonchi,rales ,or rubs present.No increased work of breathing.    Extremities:  No cyanosis, edema, or clubbing  noted    Skin: Warm & dry          Assessment & Plan:  #1 rhinosinusitis without significant bronchitis  Plan: Nasal hygiene interventions discussed. See prescription medications

## 2013-05-31 NOTE — Patient Instructions (Signed)

## 2013-06-01 ENCOUNTER — Encounter (HOSPITAL_COMMUNITY): Payer: Self-pay | Admitting: Pharmacy Technician

## 2013-06-02 ENCOUNTER — Ambulatory Visit (HOSPITAL_COMMUNITY)
Admission: RE | Admit: 2013-06-02 | Discharge: 2013-06-02 | Disposition: A | Discharge status: Home / Self Care | Payer: BC Managed Care – PPO | Source: Ambulatory Visit | Attending: Obstetrics & Gynecology | Admitting: Obstetrics & Gynecology

## 2013-06-02 DIAGNOSIS — Z1231 Encounter for screening mammogram for malignant neoplasm of breast: Secondary | ICD-10-CM | POA: Insufficient documentation

## 2013-06-07 ENCOUNTER — Encounter (HOSPITAL_COMMUNITY): Payer: Self-pay | Admitting: Certified Registered"

## 2013-06-08 ENCOUNTER — Ambulatory Visit (HOSPITAL_COMMUNITY): Payer: BC Managed Care – PPO | Admitting: Certified Registered"

## 2013-06-08 ENCOUNTER — Encounter (HOSPITAL_COMMUNITY): Payer: Self-pay | Admitting: Anesthesiology

## 2013-06-08 ENCOUNTER — Ambulatory Visit (HOSPITAL_COMMUNITY)
Admission: RE | Admit: 2013-06-08 | Discharge: 2013-06-08 | Disposition: A | Discharge status: Home / Self Care | Payer: BC Managed Care – PPO | Source: Ambulatory Visit | Attending: Internal Medicine | Admitting: Internal Medicine

## 2013-06-08 ENCOUNTER — Encounter (HOSPITAL_COMMUNITY)
Admission: RE | Disposition: A | Discharge status: Home / Self Care | Payer: Self-pay | Source: Ambulatory Visit | Attending: Internal Medicine

## 2013-06-08 ENCOUNTER — Encounter (HOSPITAL_COMMUNITY): Payer: BC Managed Care – PPO | Admitting: Certified Registered"

## 2013-06-08 DIAGNOSIS — I4949 Other premature depolarization: Secondary | ICD-10-CM | POA: Insufficient documentation

## 2013-06-08 DIAGNOSIS — I471 Supraventricular tachycardia, unspecified: Secondary | ICD-10-CM

## 2013-06-08 DIAGNOSIS — I498 Other specified cardiac arrhythmias: Secondary | ICD-10-CM | POA: Insufficient documentation

## 2013-06-08 DIAGNOSIS — K219 Gastro-esophageal reflux disease without esophagitis: Secondary | ICD-10-CM | POA: Insufficient documentation

## 2013-06-08 HISTORY — PX: SUPRAVENTRICULAR TACHYCARDIA ABLATION: SHX5492

## 2013-06-08 LAB — BASIC METABOLIC PANEL
BUN: 23 mg/dL (ref 6–23)
CALCIUM: 8.9 mg/dL (ref 8.4–10.5)
CO2: 22 mEq/L (ref 19–32)
Chloride: 108 mEq/L (ref 96–112)
Creatinine, Ser: 0.69 mg/dL (ref 0.50–1.10)
GFR calc non Af Amer: 89 mL/min — ABNORMAL LOW (ref >90)
Glucose, Bld: 102 mg/dL — ABNORMAL HIGH (ref 70–99)
Potassium: 4.3 mEq/L (ref 3.7–5.3)
SODIUM: 144 meq/L (ref 137–147)

## 2013-06-08 LAB — CBC
HCT: 38.7 % (ref 36.0–46.0)
Hemoglobin: 13.3 g/dL (ref 12.0–15.0)
MCH: 29 pg (ref 26.0–34.0)
MCHC: 34.4 g/dL (ref 30.0–36.0)
MCV: 84.5 fL (ref 78.0–100.0)
PLATELETS: 254 10*3/uL (ref 150–400)
RBC: 4.58 MIL/uL (ref 3.87–5.11)
RDW: 12.6 % (ref 11.5–15.5)
WBC: 5.9 10*3/uL (ref 4.0–10.5)

## 2013-06-08 SURGERY — SUPRAVENTRICULAR TACHYCARDIA ABLATION
Anesthesia: General

## 2013-06-08 MED ORDER — MIDAZOLAM HCL 5 MG/5ML IJ SOLN
INTRAMUSCULAR | Status: DC | PRN
  Administered 2013-06-08: 2 mg via INTRAVENOUS

## 2013-06-08 MED ORDER — SODIUM CHLORIDE 0.9 % IJ SOLN: 3.0000 mL | Freq: Two times a day (BID) | INTRAMUSCULAR | Status: DC

## 2013-06-08 MED ORDER — LACTATED RINGERS IV SOLN
INTRAVENOUS | Status: DC | PRN
  Administered 2013-06-08: 07:00:00 via INTRAVENOUS

## 2013-06-08 MED ORDER — ACETAMINOPHEN 325 MG PO TABS: 650.0000 mg | ORAL_TABLET | ORAL | Status: DC | PRN

## 2013-06-08 MED ORDER — LIDOCAINE HCL (CARDIAC) 20 MG/ML IV SOLN
INTRAVENOUS | Status: DC | PRN
  Administered 2013-06-08: 80 mg via INTRAVENOUS

## 2013-06-08 MED ORDER — FENTANYL CITRATE 0.05 MG/ML IJ SOLN
INTRAMUSCULAR | Status: DC | PRN
  Administered 2013-06-08: 25 ug via INTRAVENOUS
  Administered 2013-06-08: 50 ug via INTRAVENOUS
  Administered 2013-06-08: 25 ug via INTRAVENOUS

## 2013-06-08 MED ORDER — SODIUM CHLORIDE 0.9 % IV SOLN: 250.0000 mL | INTRAVENOUS | Status: DC | PRN

## 2013-06-08 MED ORDER — SODIUM CHLORIDE 0.9 % IJ SOLN: 3.0000 mL | INTRAMUSCULAR | Status: DC | PRN

## 2013-06-08 MED ORDER — BUPIVACAINE HCL (PF) 0.25 % IJ SOLN
INTRAMUSCULAR | Status: AC
  Filled 2013-06-08: qty 60

## 2013-06-08 MED ORDER — ONDANSETRON HCL 4 MG/2ML IJ SOLN: 4.0000 mg | Freq: Four times a day (QID) | INTRAMUSCULAR | Status: DC | PRN

## 2013-06-08 MED ORDER — ALBUMIN HUMAN 5 % IV SOLN
INTRAVENOUS | Status: DC | PRN
  Administered 2013-06-08: 08:00:00 via INTRAVENOUS

## 2013-06-08 MED ORDER — DEXTROSE 5 % IV SOLN
1000.0000 ug | INTRAVENOUS | Status: DC | PRN
  Administered 2013-06-08: 2 ug/min via INTRAVENOUS

## 2013-06-08 MED ORDER — HYDROCODONE-ACETAMINOPHEN 5-325 MG PO TABS: 1.0000 | ORAL_TABLET | ORAL | Status: DC | PRN

## 2013-06-08 MED ORDER — PROPOFOL 10 MG/ML IV BOLUS
INTRAVENOUS | Status: DC | PRN
  Administered 2013-06-08: 20 mg via INTRAVENOUS
  Administered 2013-06-08: 120 mg via INTRAVENOUS

## 2013-06-08 NOTE — Discharge Instructions (Signed)
No driving for 4. No lifting over 5 lbs for 1 week. No sexual activity for 1 week.  Keep procedure site clean & dry. If you notice increased pain, swelling, bleeding or pus, call/return!  You may shower, but no soaking baths/hot tubs/pools for 1 week.  ° ° °

## 2013-06-08 NOTE — Anesthesia Procedure Notes (Signed)
Procedure Name: LMA Insertion Date/Time: 06/08/2013 7:36 AM Performed by: Maeola Harman Pre-anesthesia Checklist: Patient identified, Emergency Drugs available, Suction available, Patient being monitored and Timeout performed Patient Re-evaluated:Patient Re-evaluated prior to inductionOxygen Delivery Method: Circle system utilized Preoxygenation: Pre-oxygenation with 100% oxygen Intubation Type: IV induction Ventilation: Mask ventilation without difficulty LMA: LMA inserted LMA Size: 4.0 Number of attempts: 1 Placement Confirmation: breath sounds checked- equal and bilateral and positive ETCO2 Tube secured with: Tape Dental Injury: Teeth and Oropharynx as per pre-operative assessment  Comments: Easy atraumatic induction and LMA placement.  Verified placement by Dr. Tamala Julian. Waldron Session, CRNA

## 2013-06-08 NOTE — Progress Notes (Signed)
Doing well s/p ablation No concerns Will discharge to home Follow-up with me in 4 weeks

## 2013-06-08 NOTE — Interval H&P Note (Signed)
History and Physical Interval Note:  06/08/2013 7:22 AM  Therapeutic strategies for supraventricular tachycardia including medicine and ablation were discussed in detail with the patient today. Risk, benefits, and alternatives to EP study and radiofrequency ablation were also discussed in detail today. These risks include but are not limited to stroke, bleeding, vascular damage, tamponade, perforation, damage to the heart and other structures, AV block requiring pacemaker, worsening renal function, and death. The patient understands these risk and wishes to proceed.  We will therefore proceed with catheter ablation.    Toni [name]  has presented today for surgery, with the diagnosis of svt  The various methods of treatment have been discussed with the patient and family. After consideration of risks, benefits and other options for treatment, the patient has consented to  Procedure(s): SUPRAVENTRICULAR TACHYCARDIA ABLATION (N/A) as a surgical intervention .  The patient's history has been reviewed, patient examined, no change in status, stable for surgery.  I have reviewed the patient's chart and labs.  Questions were answered to the patient's satisfaction.     Thompson Grayer

## 2013-06-08 NOTE — Anesthesia Postprocedure Evaluation (Signed)
  Anesthesia Post-op Note  Patient: Toni [name]  Procedure(s) Performed: Procedure(s): SUPRAVENTRICULAR TACHYCARDIA ABLATION (N/A)  Patient Location: PACU  Anesthesia Type:General  Level of Consciousness: awake, oriented, sedated and patient cooperative  Airway and Oxygen Therapy: Patient Spontanous Breathing  Post-op Pain: none  Post-op Assessment: Post-op Vital signs reviewed, Patient's Cardiovascular Status Stable, Respiratory Function Stable, Patent Airway, No signs of Nausea or vomiting and Pain level controlled  Post-op Vital Signs: stable  Complications: No apparent anesthesia complications

## 2013-06-08 NOTE — Brief Op Note (Signed)
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

## 2013-06-08 NOTE — Anesthesia Preprocedure Evaluation (Addendum)
Anesthesia Evaluation  Patient identified by MRN, date of birth, ID band Patient awake    Reviewed: Allergy & Precautions, H&P , NPO status , Patient's Chart, lab work & pertinent test results  Airway Mallampati: II TM Distance: >3 FB     Dental  (+) Teeth Intact and Dental Advisory Given   Pulmonary  breath sounds clear to auscultation        Cardiovascular + dysrhythmias Supra Ventricular Tachycardia Rhythm:Irregular Rate:Bradycardia     Neuro/Psych    GI/Hepatic GERD-  Controlled,  Endo/Other    Renal/GU      Musculoskeletal   Abdominal (+)  Abdomen: soft. Bowel sounds: normal.  Peds  Hematology   Anesthesia Other Findings   Reproductive/Obstetrics                         Anesthesia Physical Anesthesia Plan  ASA: III  Anesthesia Plan: General   Post-op Pain Management:    Induction: Intravenous  Airway Management Planned: LMA  Additional Equipment:   Intra-op Plan:   Post-operative Plan: Extubation in OR  Informed Consent: I have reviewed the patients History and Physical, chart, labs and discussed the procedure including the risks, benefits and alternatives for the proposed anesthesia with the patient or authorized representative who has indicated his/her understanding and acceptance.     Plan Discussed with:   Anesthesia Plan Comments:         Anesthesia Quick Evaluation

## 2013-06-08 NOTE — Transfer of Care (Signed)
Immediate Anesthesia Transfer of Care Note  Patient: Toni [name]  Procedure(s) Performed: Procedure(s): SUPRAVENTRICULAR TACHYCARDIA ABLATION (N/A)  Patient Location: PACU  Anesthesia Type:General  Level of Consciousness: awake, alert  and oriented  Airway & Oxygen Therapy: Patient Spontanous Breathing  Post-op Assessment: Report given to PACU RN  Post vital signs: stable  Complications: No apparent anesthesia complications

## 2013-06-08 NOTE — H&P (View-Only) (Signed)
ELECTROPHYSIOLOGY CONSULT NOTE  Patient ID: Toni [name] MRN: [id number], DOB/AGE: Dec 11, 1945   Admit date: 05/17/2013 Date of Consult: 05/17/2013  Primary Physician: Unice Cobble, MD Primary Cardiologist: Thompson Grayer, MD Reason for Consultation: SVT  History of Present Illness Toni [name] is a 68 y.o. female with PSVT / short RP tachycardia who is followed by Dr. Rayann Heman. She is taking nadolol. She has deferred EPS +RF ablation in the past because her SVT was improving, becoming less frequent. She has not had any recurrence since Nov 2013. This AM, while at home, she experienced the sudden onset of racing palpitations accompanied by lightheadedness and chest "pressure." She denies SOB or syncope. She recognized her symptoms right away as her usual symptom with her SVT. She was not able to terminate the tachycardia with vagal maneuvers so she came to the ED. Here a 12-lead ECG showed a narrow complex tachycardia at 174 bpm, short RP tachycardia. She was given adenosine and converted to SR. She is hemodynamically stable. She reports compliance with her medications and denies any recent changes. She denies recent illness or fever. She denies excess caffeine intake.      Past Medical History Past Medical History  Diagnosis Date  . Aortic sclerosis 2000    on ECHO  . Stricture and stenosis of esophagus     dilation X 3  . Esophageal reflux   . Diverticulosis of colon (without mention of hemorrhage)   . Colon polyps     Dr Sharlett Iles  . Hyperlipidemia 1999    LDL 158, resolved with diet  . Eye abnormality     OD ocular freckle  . SVT (supraventricular tachycardia)     short RP , adenosine sensitive  . Premature ventricular contractions     Past Surgical History Past Surgical History  Procedure Laterality Date  . Ovarian wedge resection      to allow pregnancy  . Knee arthroscopy      bilateral  . Ankle surgery      right  . Tonsillectomy    . Colonoscopy w/  polypectomy  2011    Dr Sharlett Iles    Allergies/Intolerances Allergies  Allergen Reactions  . Dexlansoprazole     REACTION: pressure in ears  . Lansoprazole     REACTION: pressure in ears  . Omeprazole     REACTION: pressure in ears    Current Home Medications   Medication List    ASK your doctor about these medications       aspirin 81 MG chewable tablet  Chew 81 mg by mouth daily.     aspirin-acetaminophen-caffeine 606-301-60 MG per tablet  Commonly known as:  EXCEDRIN MIGRAINE  Take 1 tablet by mouth every 6 (six) hours as needed for headache.     CALCIUM CITRATE PO  Take 1 tablet by mouth daily.     cholecalciferol 1000 UNITS tablet  Commonly known as:  VITAMIN D  Take 1,000 Units by mouth daily.     diltiazem 60 MG tablet  Commonly known as:  CARDIZEM  Take 60 mg by mouth 4 (four) times daily as needed (when heart races).     Fish Oil 1000 MG Caps  Take 1 capsule by mouth daily.     multivitamin with minerals Tabs tablet  Take 1 tablet by mouth daily.     nadolol 20 MG tablet  Commonly known as:  CORGARD  Take 10 mg by mouth daily.     potassium chloride 10 MEQ  tablet  Commonly known as:  KLOR-CON M10  Take 1 tablet (10 mEq total) by mouth daily.     RABEprazole 20 MG tablet  Commonly known as:  ACIPHEX  Take 1 tablet (20 mg total) by mouth 2 (two) times daily.     VITAMIN E PO  Take 1 tablet by mouth daily.       Family History Family History  Problem Relation Age of Onset  . Stroke Father 79  . Arthritis Mother     OA  . Hypertension Mother   . Hypothyroidism Mother   . Hypothyroidism Sister   . Heart attack Sister 50    smoker  . Osteopenia Sister   . Ovarian cancer Sister   . Hypertrophic cardiomyopathy Sister   . Diabetes Maternal Grandmother   . Stroke Mother 29    ischemic     Social History History   Social History  . Marital Status: Married    Spouse Name: N/A    Number of Children: 2  . Years of Education: N/A    Occupational History  . Network engineer   .     Social History Main Topics  . Smoking status: Never Smoker   . Smokeless tobacco: Never Used  . Alcohol Use: Yes     Comment: socially, once / month  . Drug Use: No  . Sexual Activity: Not on file   Other Topics Concern  . Not on file   Social History Narrative  . No narrative on file     Review of Systems General: No chills, fever, night sweats or weight changes  Cardiovascular:  No chest pain, dyspnea on exertion, edema, orthopnea, palpitations, paroxysmal nocturnal dyspnea Dermatological: No rash, lesions or masses Respiratory: No cough, dyspnea Urologic: No hematuria, dysuria Abdominal: No nausea, vomiting, diarrhea, bright red blood per rectum, melena, or hematemesis Neurologic: No visual changes, weakness, changes in mental status All other systems reviewed and are otherwise negative except as noted above.  Physical Exam Vitals: Blood pressure 107/62, pulse 73, temperature 97.5 F (36.4 C), temperature source Oral, resp. rate 10, height 5\' 3"  (1.6 m), weight 177 lb (80.287 kg), SpO2 100.00%.  General: Well developed, well appearing 68 y.o. female in no acute distress. HEENT: Normocephalic, atraumatic. EOMs intact. Sclera nonicteric. Oropharynx clear.  Neck: Supple. No JVD. Lungs: Respirations regular and unlabored, CTA bilaterally. No wheezes, rales or rhonchi. Heart: RRR. S1, S2 present. No murmurs, rub, S3 or S4. Abdomen: Soft, non-distended.  Extremities: No clubbing, cyanosis or edema. DP/PT/Radials 2+ and equal bilaterally. Psych: Normal affect. Neuro: Alert and oriented X 3. Moves all extremities spontaneously. Musculoskeletal: No kyphosis. Skin: Intact. Warm and dry. No rashes or petechiae in exposed areas.   Labs Lab Results  Component Value Date   WBC 11.8* 06-01-13   HGB 14.7 June 01, 2013   HCT 42.3 06/01/2013   MCV 86.3 Jun 01, 2013   PLT 251 06/01/13    Recent Labs Lab June 01, 2013 1342  NA 136  K 3.7   CL 100  CO2 24  BUN 18  CREATININE 0.72  CALCIUM 9.0  GLUCOSE 117*   Lab Results  Component Value Date   CHOL 202* 08/25/2012   HDL 54.40 08/25/2012   LDLCALC 114* 04/12/2010   TRIG 94.0 08/25/2012    Radiology/Studies Dg Chest Portable 1 View June 01, 2013   CLINICAL DATA:  Supraventricular tachycardia  EXAM: PORTABLE CHEST - 1 VIEW  COMPARISON:  April 01, 2012  FINDINGS: There is no edema or consolidation. Heart size and  pulmonary vascularity are normal. No adenopathy. No pneumothorax. No bone lesions.   IMPRESSION: No abnormality noted.    Electronically Signed   By: Lowella Grip M.D.   On: 05/17/2013 14:07   Echocardiogram March 2013 Study Conclusions - Left ventricle: The cavity size was normal. Wall thickness was increased in a pattern of mild LVH. Systolic function was normal. The estimated ejection fraction was in the range of 60% to 65%. Wall motion was normal; there were no regional wall motion abnormalities. - Mitral valve: Mild regurgitation. - Left atrium: The atrium was mildly dilated. - Atrial septum: No defect or patent foramen ovale was identified.  12-lead ECG - narrow complex tachycardia / SVT at 174 bpm Telemetry - currently NSR  Assessment and Plan 1. SVT - now back in SR; symptoms resolved; hemodynamically stable; first recurrence in one year - adenosine sensitive - discussed treatment options including medical therapy and possible EPS +RF ablation; for now, will continue medical therapy and follow-up with Dr. Rayann Heman as an outpatient to discuss possible EPS  Dr. Lovena Le to see Signed, Ileene Hutchinson, PA-C 05/17/2013, 4:27 PM   EP Attending  Patient seen and examined. I have reviewed the history as noted above and have made amendments as needed. She has a h/o SVT and presents with another episode. She was treated with IV adenosine and returned to NSR. She has a short RP tachycardia. I have recommended she be allowed to be discharged home and  follow up for an ablation with Dr. Rayann Heman her primary EP MD. She is instructed to avoid caffiene, ETOH, and chocolate.   Mikle Bosworth.D.

## 2013-06-08 NOTE — Preoperative (Signed)
Beta Blockers   Reason not to administer Beta Blockers:Not Applicable 

## 2013-06-09 NOTE — Op Note (Signed)
Toni [name], REARDON NO.:  [id number]  MEDICAL RECORD NO.:  [id number]  LOCATION:  [facility]                         FACILITY:  [facility]  PHYSICIAN:  Thompson Grayer, MD       DATE OF BIRTH:  05-08-1946  DATE OF PROCEDURE:  06/08/2013 DATE OF DISCHARGE:                              OPERATIVE REPORT   SURGEON:  Thompson Grayer, MD  PREPROCEDURE DIAGNOSIS:  Supraventricular tachycardia.  POSTPROCEDURE DIAGNOSIS:  Classic AV nodal reentrant tachycardia.  PROCEDURES: 1. Comprehensive electrophysiologic study. 2. Coronary sinus pacing and recording. 3. Mapping of supraventricular tachycardia. 4. Ablation of supraventricular tachycardia. 5. Arrhythmia induction with isoproterenol infused.  INTRODUCTION:  Toni [name] is a very pleasant 68 year old female with recurrent symptomatic supraventricular tachycardia.  She has been documented to have a short RP tachycardia which is adenosine sensitive. She has failed medical therapy with beta-blockers.  She therefore presents today for EP study and radiofrequency ablation.  DESCRIPTION OF PROCEDURE:  Informed written consent was obtained and the patient was brought to the Electrophysiology Lab in a fasting state. She was adequately sedated with intravenous medications as outlined in the Anesthesia report.  The patient's right neck and groin were prepped and draped in the usual sterile fashion by the EP lab staff.  Using a percutaneous Seldinger technique, one 6-French hemostasis sheath was placed into the right internal jugular vein.  A 6-French curved Damato catheter was introduced through the right common femoral vein and advanced into the coronary sinus for recording and pacing from this location.  Two 6-French and one 8-French hemostasis sheaths were placed in the right common femoral vein.  Two 6-French quadripolar Josephson catheters were introduced through the right common femoral vein and advanced into the His bundle and  right ventricular apex positions respectively.  The patient presented to the electrophysiology lab in normal sinus rhythm.  She had a very rare PVCs today.  Her PR interval measured 182 milliseconds with a QRS duration of 74 milliseconds and a QT interval of 377 milliseconds.  The average RR interval was 913 milliseconds.  The AH interval measured 100 milliseconds with an HV interval of 47 milliseconds.  Ventricular pacing was performed which revealed midline concentric decremental VA conduction.  The VA Wenckebach cycle length was 420 milliseconds.  Ventricular extrastimulus testing was performed which again revealed midline concentric decremental VA conduction with no retrograde jumps, echo beats, or tachycardias observed.  The retrograde AV nodal ERP was 500/350 milliseconds.  Rapid atrial pacing was performed which revealed PR greater than RR with no tachycardias observed.  Atrial extrastimulus testing was performed, which revealed multiple accentuated AH jumps. There were no echo beats and tachycardia was not initiated.  These maneuvers were repeated and reproducible.  Isoproterenol was infused at 2 mcg/minute and an appropriate acceleration in heart rate was observed. During isoproterenol infusion, ventricular pacing revealed midline VA conduction.  Ventricular extrastimulus testing revealed midline concentric and decremental VA conduction with no retrograde jumps, echo beats, or tachycardias.  Rapid atrial pacing was performed which revealed PR greater than RR with tachycardia induced.  This was a one-to- one narrow complex tachycardia with an average cycle length of 380 milliseconds.  The VA time  measured 20 milliseconds with the earliest atrial retrograde activation recorded from the His electrogram. Ventricular pacing during tachycardia revealed a VAV response.  PVCs were delivered during His refractoriness, which did not advance nor terminate the tachycardia.  The patient was  therefore felt to have classic AV nodal reentrant tachycardia.  The tachycardia was inducible with multiple attempts with ease.  I therefore elected to perform slow pathway modification.  A 7-French Biosense Webster 4-mm ablation catheter was introduced through the right common femoral vein and advanced into the right atrium.  Atrial mapping of Derrill Kay triangle was performed, this revealed a standard triangle which was slightly horizontal in nature.  A series of five radiofrequency applications were delivered with a target temperature of 60 degrees at 50 watts.  The accelerated junctional rhythm was observed during the fourth and fifth radiofrequency application, the fourth lesion was delivered for 60 seconds.  The fifth lesion was delivered for 120 seconds.  Following ablation, atrial extrastimulus testing was performed, which revealed no AH jumps, echo beats, or tachycardias.  With rapid atrial pacing, the patient had PR less than RR.  No tachycardias were induced. Isoproterenol was again infused at 2 mcg/minute.  Rapid atrial pacing was performed which revealed PR less than RR with no tachycardias initiated.  The AV Wenckebach cycle length was 360 milliseconds during isoproterenol infusion.  At baseline, the AV Wenckebach cycle length was 430 milliseconds.  During isoproterenol infusion with atrial extrastimulus testing, there were no AH jumps, echo beats, or tachycardias observed.  Ventricular pacing again revealed midline concentric decremental VA conduction.  The patient was observed for over 15 minutes without return of slow pathway conduction or inducible tachycardia.  Following ablation, the AH interval measured 81 milliseconds with an HV interval of 40 milliseconds.  The procedure was therefore considered completed.  All catheters were removed and the sheaths were aspirated and flushed.  The sheaths were removed and hemostasis was assured.  There were no early apparent  complications.  CONCLUSIONS: 1. Sinus rhythm upon presentation. 2. No accessory pathways, though the patient did have dual     atrioventricular nodal physiology. 3. 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 4. Successful slow pathway modification today. 5. No inducible arrhythmias following ablation. 6. No early apparent complications.     Thompson Grayer, MD     JA/MEDQ  D:  06/08/2013  T:  06/08/2013  Job:  [id number]

## 2013-06-11 ENCOUNTER — Encounter (HOSPITAL_COMMUNITY): Payer: Self-pay | Admitting: *Deleted

## 2013-06-13 ENCOUNTER — Encounter: Payer: Self-pay | Admitting: Internal Medicine

## 2013-06-15 ENCOUNTER — Telehealth: Payer: Self-pay | Admitting: Internal Medicine

## 2013-06-15 NOTE — Telephone Encounter (Signed)
New Prob    Pt has some questions regarding her follow up visit. Please call.

## 2013-06-16 ENCOUNTER — Telehealth: Payer: Self-pay | Admitting: Internal Medicine

## 2013-06-16 NOTE — Telephone Encounter (Signed)
FMLA Signed and Completed by Dr.Allred Faxed to [place] at [phone]

## 2013-06-16 NOTE — Telephone Encounter (Signed)
Forms done and faxed to [employer]  Patient aware

## 2013-06-16 NOTE — Telephone Encounter (Signed)
Pt aware FMLA Faxed,Signed & Completed Mailed to Pt home Address [phone]

## 2013-07-21 ENCOUNTER — Encounter: Payer: Medicare Other | Admitting: Internal Medicine

## 2013-09-29 ENCOUNTER — Ambulatory Visit (INDEPENDENT_AMBULATORY_CARE_PROVIDER_SITE_OTHER): Payer: Medicare Other | Admitting: Internal Medicine

## 2013-09-29 ENCOUNTER — Encounter: Payer: Self-pay | Admitting: Internal Medicine

## 2013-09-29 VITALS — BP 146/83 | HR 69 | Ht 63.0 in | Wt 184.4 lb

## 2013-09-29 DIAGNOSIS — I498 Other specified cardiac arrhythmias: Secondary | ICD-10-CM

## 2013-09-29 DIAGNOSIS — I4949 Other premature depolarization: Secondary | ICD-10-CM

## 2013-09-29 DIAGNOSIS — I471 Supraventricular tachycardia: Secondary | ICD-10-CM

## 2013-09-29 DIAGNOSIS — I493 Ventricular premature depolarization: Secondary | ICD-10-CM

## 2013-09-29 NOTE — Patient Instructions (Addendum)
Your physician recommends that you schedule a follow-up appointment as needed  

## 2013-09-29 NOTE — Progress Notes (Signed)
PCP: Unice Cobble, MD  Toni [name] is a 68 y.o. female who presents today for routine electrophysiology followup.  Since her SVT ablation, the patient reports doing very well.  She denies procedure related issues.  Today, she denies symptoms of palpitations, chest pain, shortness of breath,  lower extremity edema, dizziness, presyncope, or syncope.  The patient is otherwise without complaint today.   Past Medical History  Diagnosis Date  . Aortic sclerosis 2000    on ECHO  . Stricture and stenosis of esophagus     dilation X 3  . Esophageal reflux   . Diverticulosis of colon (without mention of hemorrhage)   . Colon polyps     Dr Sharlett Iles  . Hyperlipidemia 1999    LDL 158, resolved with diet  . Eye abnormality     OD ocular freckle  . SVT (supraventricular tachycardia)     slow pathway ablated  . Premature ventricular contractions    Past Surgical History  Procedure Laterality Date  . Ovarian wedge resection      to allow pregnancy  . Knee arthroscopy      bilateral  . Ankle surgery      right  . Tonsillectomy    . Colonoscopy w/ polypectomy  2011    Dr Sharlett Iles  . Ablation  06/08/2013    RFCA of AVNRT by Dr Rayann Heman    Current Outpatient Prescriptions  Medication Sig Dispense Refill  . aspirin 81 MG chewable tablet Chew 81 mg by mouth daily.      Marland Kitchen CALCIUM CITRATE PO Take 1 tablet by mouth daily.       . cholecalciferol (VITAMIN D) 1000 UNITS tablet Take 1,000 Units by mouth daily.        . Multiple Vitamin (MULTIVITAMIN WITH MINERALS) TABS Take 1 tablet by mouth daily.      . nadolol (CORGARD) 20 MG tablet Take 10 mg by mouth daily.       . Omega-3 Fatty Acids (FISH OIL) 1000 MG CAPS Take 1,000 mg by mouth daily.       . potassium chloride (K-DUR) 10 MEQ tablet Take 10 mEq by mouth daily.      . RABEprazole (ACIPHEX) 20 MG tablet Take 20 mg by mouth 2 (two) times daily.      Marland Kitchen VITAMIN E PO Take 1 tablet by mouth daily.       No current facility-administered  medications for this visit.    Physical Exam: Filed Vitals:   09/29/13 1520  BP: 146/83  Pulse: 69  Height: 5\' 3"  (1.6 m)  Weight: 184 lb 6.4 oz (83.643 kg)    GEN- The patient is well appearing, alert and oriented x 3 today.   Head- normocephalic, atraumatic Eyes-  Sclera clear, conjunctiva pink Ears- hearing intact Oropharynx- clear Lungs- Clear to ausculation bilaterally, normal work of breathing Heart- Regular rate and rhythm, no murmurs, rubs or gallops, PMI not laterally displaced GI- soft, NT, ND, + BS Extremities- no clubbing, cyanosis, or edema  ekg today reveals sinus rhythm 69 bpm, septal infarct otherwise normal ekg   Assessment and Plan:  1. SVT Resolved s/p ablation Stop nadolol  2. PVCs Well controlled Stop nadolol, if PVCs return then restart nadolol  Return as needed

## 2013-10-03 ENCOUNTER — Encounter: Payer: Self-pay | Admitting: Internal Medicine

## 2013-10-04 ENCOUNTER — Other Ambulatory Visit: Payer: Self-pay | Admitting: Internal Medicine

## 2013-10-04 DIAGNOSIS — L237 Allergic contact dermatitis due to plants, except food: Secondary | ICD-10-CM

## 2013-10-04 MED ORDER — PREDNISONE 20 MG PO TABS: 20.0000 mg | ORAL_TABLET | Freq: Every day | ORAL | Status: DC

## 2014-02-10 ENCOUNTER — Other Ambulatory Visit (INDEPENDENT_AMBULATORY_CARE_PROVIDER_SITE_OTHER): Payer: Medicare Other

## 2014-02-10 ENCOUNTER — Ambulatory Visit (INDEPENDENT_AMBULATORY_CARE_PROVIDER_SITE_OTHER): Payer: Medicare Other | Admitting: Internal Medicine

## 2014-02-10 ENCOUNTER — Encounter: Payer: Self-pay | Admitting: Internal Medicine

## 2014-02-10 VITALS — BP 122/86 | HR 78 | Temp 98.4°F | Resp 13 | Ht 63.0 in | Wt 177.0 lb

## 2014-02-10 DIAGNOSIS — K2100 Gastro-esophageal reflux disease with esophagitis, without bleeding: Secondary | ICD-10-CM

## 2014-02-10 DIAGNOSIS — E785 Hyperlipidemia, unspecified: Secondary | ICD-10-CM

## 2014-02-10 DIAGNOSIS — Z86010 Personal history of colonic polyps: Secondary | ICD-10-CM

## 2014-02-10 DIAGNOSIS — R7309 Other abnormal glucose: Secondary | ICD-10-CM | POA: Insufficient documentation

## 2014-02-10 LAB — LIPID PANEL
CHOL/HDL RATIO: 4
Cholesterol: 207 mg/dL — ABNORMAL HIGH (ref 0–200)
HDL: 50.7 mg/dL (ref >39.00)
LDL CALC: 137 mg/dL — AB (ref 0–99)
NONHDL: 156.3
Triglycerides: 97 mg/dL (ref 0.0–149.0)
VLDL: 19.4 mg/dL (ref 0.0–40.0)

## 2014-02-10 LAB — CBC WITH DIFFERENTIAL/PLATELET
BASOS ABS: 0 10*3/uL (ref 0.0–0.1)
BASOS PCT: 0.4 % (ref 0.0–3.0)
EOS ABS: 0.1 10*3/uL (ref 0.0–0.7)
Eosinophils Relative: 0.9 % (ref 0.0–5.0)
HCT: 40.3 % (ref 36.0–46.0)
Hemoglobin: 13.9 g/dL (ref 12.0–15.0)
LYMPHS PCT: 38.6 % (ref 12.0–46.0)
Lymphs Abs: 2.5 10*3/uL (ref 0.7–4.0)
MCHC: 34.3 g/dL (ref 30.0–36.0)
MCV: 86.6 fl (ref 78.0–100.0)
MONOS PCT: 4.9 % (ref 3.0–12.0)
Monocytes Absolute: 0.3 10*3/uL (ref 0.1–1.0)
NEUTROS PCT: 55.2 % (ref 43.0–77.0)
Neutro Abs: 3.6 10*3/uL (ref 1.4–7.7)
Platelets: 235 10*3/uL (ref 150.0–400.0)
RBC: 4.66 Mil/uL (ref 3.87–5.11)
RDW: 13.5 % (ref 11.5–15.5)
WBC: 6.5 10*3/uL (ref 4.0–10.5)

## 2014-02-10 LAB — BASIC METABOLIC PANEL
BUN: 14 mg/dL (ref 6–23)
CALCIUM: 9.1 mg/dL (ref 8.4–10.5)
CO2: 29 mEq/L (ref 19–32)
CREATININE: 0.9 mg/dL (ref 0.4–1.2)
Chloride: 103 mEq/L (ref 96–112)
GFR: 70.99 mL/min (ref >60.00)
Glucose, Bld: 95 mg/dL (ref 70–99)
Potassium: 3.8 mEq/L (ref 3.5–5.1)
SODIUM: 138 meq/L (ref 135–145)

## 2014-02-10 LAB — HEPATIC FUNCTION PANEL
ALBUMIN: 4.3 g/dL (ref 3.5–5.2)
ALT: 19 U/L (ref 0–35)
AST: 21 U/L (ref 0–37)
Alkaline Phosphatase: 78 U/L (ref 39–117)
BILIRUBIN DIRECT: 0.1 mg/dL (ref 0.0–0.3)
TOTAL PROTEIN: 7.5 g/dL (ref 6.0–8.3)
Total Bilirubin: 0.8 mg/dL (ref 0.2–1.2)

## 2014-02-10 LAB — TSH: TSH: 2.41 u[IU]/mL (ref 0.35–4.50)

## 2014-02-10 LAB — HEMOGLOBIN A1C: Hgb A1c MFr Bld: 5.4 % (ref 4.6–6.5)

## 2014-02-10 NOTE — Progress Notes (Signed)
Pre visit review using our clinic review tool, if applicable. No additional management support is needed unless otherwise documented below in the visit note. 

## 2014-02-10 NOTE — Progress Notes (Signed)
Subjective:    Patient ID: Toni [name], female    DOB: [birth year], 68 y.o.   MRN: [id number]  HPI She is here to assess active health issues & conditions. PMH, FH, & Social history verified & updated .  She had an ablation in January of this year. She continues to have some PVCs.  She also has some exertional dyspnea without chest pain.  She is on generic AcipHex daily with control of her reflux.    Review of Systems Unexplained weight loss, abdominal pain, significant dyspepsia, dysphagia, melena, rectal bleeding, or persistently small caliber stools are denied.     Objective:   Physical Exam  Gen.: Healthy and well-nourished in appearance. Alert, appropriate and cooperative throughout exam. Appears younger than stated age  Head: Normocephalic without obvious abnormalities Eyes: No corneal or conjunctival inflammation noted. Pupils equal round reactive to light and accommodation. Extraocular motion intact.  Ears: External  ear exam reveals no significant lesions or deformities. Canals clear .TMs normal. Hearing is grossly normal bilaterally. Nose: External nasal exam reveals no deformity or inflammation. Nasal mucosa are pink and moist. No lesions or exudates noted.   Mouth: Oral mucosa and oropharynx reveal no lesions or exudates. Teeth in good repair. Neck: No deformities, masses, or tenderness noted. Range of motion & Thyroid normal. Lungs: Normal respiratory effort; chest expands symmetrically. Lungs are clear to auscultation without rales, wheezes, or increased work of breathing. Heart: Normal rate and rhythm. Normal S1 and S2. No gallop, click, or rub.Grade 1 systolic murmur. Abdomen: Bowel sounds normal; abdomen soft and nontender. No masses, organomegaly or hernias noted. Genitalia:  as per Gyn                                  Musculoskeletal/extremities: No deformity or scoliosis noted of  the thoracic or lumbar spine.  No clubbing, cyanosis, edema, or significant  extremity  deformity noted. Range of motion normal .Tone & strength normal. Hand joints normal.  Fingernail health good. Able to lie down & sit up w/o help. Negative SLR bilaterally Vascular: Carotid, radial artery, dorsalis pedis and  posterior tibial pulses are full and equal. No bruits present. Neurologic: Alert and oriented x3. Deep tendon reflexes symmetrical and normal.  Gait normal.      Skin: Intact without suspicious lesions or rashes. Lymph: No cervical, axillary lymphadenopathy present. Psych: Mood and affect are normal. Normally interactive                                                                                        Assessment & Plan:  See Current Assessment & Plan in Problem List under specific Diagnosis

## 2014-02-10 NOTE — Assessment & Plan Note (Signed)
Lipoprofile Lipid Panel, LFTs, TSH

## 2014-02-10 NOTE — Assessment & Plan Note (Signed)
CBC & dif 

## 2014-02-10 NOTE — Patient Instructions (Signed)
Your next office appointment will be determined based upon review of your pending labs. Those instructions will be transmitted to you through My Chart . 

## 2014-02-10 NOTE — Assessment & Plan Note (Signed)
A1c

## 2014-02-10 NOTE — Progress Notes (Signed)
Blood pressure taken a second time in right arm (see vitals)

## 2014-02-12 ENCOUNTER — Other Ambulatory Visit: Payer: Self-pay | Admitting: Internal Medicine

## 2014-02-12 DIAGNOSIS — E785 Hyperlipidemia, unspecified: Secondary | ICD-10-CM

## 2014-02-12 DIAGNOSIS — Z8249 Family history of ischemic heart disease and other diseases of the circulatory system: Secondary | ICD-10-CM

## 2014-02-14 ENCOUNTER — Encounter: Payer: Medicare Other | Admitting: Internal Medicine

## 2014-02-16 ENCOUNTER — Ambulatory Visit (INDEPENDENT_AMBULATORY_CARE_PROVIDER_SITE_OTHER): Payer: Medicare Other

## 2014-02-16 DIAGNOSIS — Z23 Encounter for immunization: Secondary | ICD-10-CM

## 2014-04-12 ENCOUNTER — Ambulatory Visit: Payer: Medicare Other

## 2014-05-05 ENCOUNTER — Encounter (HOSPITAL_COMMUNITY): Payer: Self-pay | Admitting: Internal Medicine

## 2014-06-14 ENCOUNTER — Other Ambulatory Visit (HOSPITAL_COMMUNITY): Payer: Self-pay | Admitting: Obstetrics & Gynecology

## 2014-06-14 DIAGNOSIS — Z1231 Encounter for screening mammogram for malignant neoplasm of breast: Secondary | ICD-10-CM

## 2014-07-08 ENCOUNTER — Telehealth: Payer: Self-pay

## 2014-07-08 NOTE — Telephone Encounter (Signed)
LVM for pt to call back.   RE: Flu vaccine for 2015/2016

## 2014-07-11 ENCOUNTER — Telehealth: Payer: Self-pay | Admitting: Internal Medicine

## 2014-07-11 NOTE — Telephone Encounter (Signed)
[facility] [place] [facility]

## 2014-07-15 ENCOUNTER — Ambulatory Visit (HOSPITAL_COMMUNITY): Payer: Medicare Other

## 2014-07-21 ENCOUNTER — Encounter: Payer: Self-pay | Admitting: Internal Medicine

## 2014-07-21 ENCOUNTER — Telehealth: Payer: Self-pay | Admitting: Gastroenterology

## 2014-07-21 NOTE — Telephone Encounter (Signed)
Patient states she has had left side abdominal pain for several weeks. 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 Does not have a preference. Scheduled with Lori Hvozdovic, PA-C  On 07/27/14 per patient request.

## 2014-07-26 ENCOUNTER — Other Ambulatory Visit (INDEPENDENT_AMBULATORY_CARE_PROVIDER_SITE_OTHER): Payer: Medicare Other

## 2014-07-26 ENCOUNTER — Ambulatory Visit (INDEPENDENT_AMBULATORY_CARE_PROVIDER_SITE_OTHER): Payer: PRIVATE HEALTH INSURANCE | Admitting: Physician Assistant

## 2014-07-26 ENCOUNTER — Encounter: Payer: Self-pay | Admitting: Physician Assistant

## 2014-07-26 VITALS — BP 124/70 | HR 80 | Ht 62.75 in | Wt 183.2 lb

## 2014-07-26 DIAGNOSIS — K219 Gastro-esophageal reflux disease without esophagitis: Secondary | ICD-10-CM

## 2014-07-26 DIAGNOSIS — R1032 Left lower quadrant pain: Secondary | ICD-10-CM

## 2014-07-26 DIAGNOSIS — K5730 Diverticulosis of large intestine without perforation or abscess without bleeding: Secondary | ICD-10-CM

## 2014-07-26 LAB — BASIC METABOLIC PANEL
BUN: 24 mg/dL — AB (ref 6–23)
CALCIUM: 9.7 mg/dL (ref 8.4–10.5)
CHLORIDE: 104 meq/L (ref 96–112)
CO2: 33 mEq/L — ABNORMAL HIGH (ref 19–32)
CREATININE: 0.85 mg/dL (ref 0.40–1.20)
GFR: 70.9 mL/min (ref >60.00)
Glucose, Bld: 100 mg/dL — ABNORMAL HIGH (ref 70–99)
Potassium: 4.2 mEq/L (ref 3.5–5.1)
Sodium: 140 mEq/L (ref 135–145)

## 2014-07-26 LAB — CBC WITH DIFFERENTIAL/PLATELET
BASOS PCT: 0.2 % (ref 0.0–3.0)
Basophils Absolute: 0 10*3/uL (ref 0.0–0.1)
EOS ABS: 0.1 10*3/uL (ref 0.0–0.7)
Eosinophils Relative: 1.1 % (ref 0.0–5.0)
HCT: 41.8 % (ref 36.0–46.0)
HEMOGLOBIN: 14.4 g/dL (ref 12.0–15.0)
LYMPHS ABS: 2.5 10*3/uL (ref 0.7–4.0)
Lymphocytes Relative: 37.8 % (ref 12.0–46.0)
MCHC: 34.4 g/dL (ref 30.0–36.0)
MCV: 84.9 fl (ref 78.0–100.0)
Monocytes Absolute: 0.3 10*3/uL (ref 0.1–1.0)
Monocytes Relative: 5.1 % (ref 3.0–12.0)
NEUTROS ABS: 3.6 10*3/uL (ref 1.4–7.7)
NEUTROS PCT: 55.8 % (ref 43.0–77.0)
Platelets: 256 10*3/uL (ref 150.0–400.0)
RBC: 4.92 Mil/uL (ref 3.87–5.11)
RDW: 13.7 % (ref 11.5–15.5)
WBC: 6.5 10*3/uL (ref 4.0–10.5)

## 2014-07-26 MED ORDER — RANITIDINE HCL 150 MG PO TABS: 150.0000 mg | ORAL_TABLET | Freq: Every day | ORAL | Status: DC

## 2014-07-26 MED ORDER — DICYCLOMINE HCL 10 MG PO CAPS: 10.0000 mg | ORAL_CAPSULE | Freq: Three times a day (TID) | ORAL | Status: DC | PRN

## 2014-07-26 NOTE — Patient Instructions (Addendum)
You have been scheduled for a CT scan of the abdomen and pelvis at [facility] ([address]---this is in the same building as Press photographer).   You are scheduled on 08/02/14 at 9:30am. You should arrive 15 minutes prior to your appointment time for registration. Please follow the written instructions below on the day of your exam:  WARNING: IF YOU ARE ALLERGIC TO IODINE/X-RAY DYE, PLEASE NOTIFY RADIOLOGY IMMEDIATELY AT [phone]! YOU WILL BE GIVEN A 13 HOUR PREMEDICATION PREP.  1) Do not eat or drink anything after 5:30am (4 hours prior to your test) 2) You have been given 2 bottles of oral contrast to drink. The solution may taste  better if refrigerated, but do NOT add ice or any other liquid to this solution. Shake well before drinking.    Drink 1 bottle of contrast @ 7:30am (2 hours prior to your exam)  Drink 1 bottle of contrast @ 8:30am (1 hour prior to your exam)  You may take any medications as prescribed with a small amount of water except for the following: Metformin, Glucophage, Glucovance, Avandamet, Riomet, Fortamet, Actoplus Met, Janumet, Glumetza or Metaglip. The above medications must be held the day of the exam AND 48 hours after the exam.  The purpose of you drinking the oral contrast is to aid in the visualization of your intestinal tract. The contrast solution may cause some diarrhea. Before your exam is started, you will be given a small amount of fluid to drink. Depending on your individual set of symptoms, you may also receive an intravenous injection of x-ray contrast/dye. Plan on being at [facility] for 30 minutes or long, depending on the type of exam you are having performed.  This test typically takes 30-45 minutes to complete.   Today we are giving you a low fiber , low residue diet to read over.  We have sent the following medications to your pharmacy for you to pick up at your convenience: Generic zantac, generic bentyl  Your  physician has requested that you go to the basement for the following lab work before leaving today: CBC/diff, BMET  Follow up with Cecille Rubin in 6 weeks.  Patient will call back and set up.   I appreciate the opportunity to care for you.  If you have any questions regarding your exam or if you need to reschedule, you may call the CT department at [phone] between the hours of 8:00 am and 5:00 pm, Monday-Friday.  ________________________________________________________________________

## 2014-07-26 NOTE — Progress Notes (Addendum)
Patient ID: Toni [name], female   DOB: 1945-07-27, 69 y.o.   MRN: [id number]     History of Present Illness:   Toni [name] is a delightful 69 year old female who was previously followed by Dr. Sharlett Iles for GERD. She last had a colonoscopy in August 2011, and moderate diverticulosis was found in the sigmoid to the descending colon. She had a sessile polyp removed from the descending colon and was advised to have surveillance in 5 years which would be August 2016. She also had an EGD on 01/02/2010 at which time a stricture was found in the mid esophagus and was dilated. She reports that she typically has an EGD with her colonoscopy every 5 years to screen for Barrett's. She has been prescribed rabeprazole 20 mg twice a day but rarely uses it because she is afraid of osteoporosis and low magnesium levels. When she does use that she uses it once a day on a when necessary basis. She is interested in trying a medication that is not a PPI has she has had difficulty tolerating multiple PPIs.  She is here today because she has had left lower quadrant pain of 3 weeks' duration. At the onset of her pain, she had been on a cruise in the [place] for 3 weeks and had numerous dietary indiscretions. Her pain began as what she felt was extreme gas but then localized to the left lower quadrant and has been fairly persistent. Oh associated fever or chills. Her stools have been thin for the past 2 weeks intermittently. But she is having a bowel movement on a daily basis. She has no associated nausea or vomiting. Her pain is exacerbated with ingestion of food and relieved if she can pass gas or move her bowels. Her pain occasionally radiates into the left low back. She has had no bright red blood per rectum or melena. She has had no urinary frequency, dysuria, or hematuria.   Past Medical History  Diagnosis Date  . Aortic sclerosis 2000    on ECHO  . Stricture and stenosis of esophagus     dilation X 3  . Esophageal  reflux   . Diverticulosis of colon (without mention of hemorrhage)   . Colon polyps     Dr Sharlett Iles  . Hyperlipidemia 1999    LDL 158, resolved with diet  . Eye abnormality     OD ocular freckle  . SVT (supraventricular tachycardia)     slow pathway ablated  . Premature ventricular contractions     Past Surgical History  Procedure Laterality Date  . Ovarian wedge resection      to allow pregnancy  . Knee arthroscopy      bilateral  . Ankle surgery      right  . Tonsillectomy    . Colonoscopy w/ polypectomy  2011    Dr Sharlett Iles  . Supraventricular tachycardia ablation N/A 06/08/2013    Procedure: SUPRAVENTRICULAR TACHYCARDIA ABLATION;  Surgeon: Coralyn Mark, MD;  Location: [facility] CATH LAB;  Service: Cardiovascular;  Laterality: N/A;   Family History  Problem Relation Age of Onset  . Stroke Father 11  . Arthritis Mother     OA  . Hypertension Mother   . Hypothyroidism Mother   . Hypothyroidism Sister   . Heart attack Sister 4    smoker  . Osteopenia Sister   . Ovarian cancer Sister   . Hypertrophic cardiomyopathy Sister   . Diabetes Maternal Grandmother   . Stroke Mother 71    ischemic  History  Substance Use Topics  . Smoking status: Never Smoker   . Smokeless tobacco: Never Used  . Alcohol Use: Yes     Comment: socially, once / month   Current Outpatient Prescriptions  Medication Sig Dispense Refill  . aspirin 81 MG chewable tablet Chew 81 mg by mouth daily.    Marland Kitchen CALCIUM CITRATE PO Take 1 tablet by mouth daily.     . cholecalciferol (VITAMIN D) 1000 UNITS tablet Take 1,000 Units by mouth daily.      . Multiple Vitamin (MULTIVITAMIN WITH MINERALS) TABS Take 1 tablet by mouth daily.    . Omega-3 Fatty Acids (FISH OIL) 1000 MG CAPS Take 1,000 mg by mouth daily.     . RABEprazole (ACIPHEX) 20 MG tablet Take 20 mg by mouth 2 (two) times daily.    Marland Kitchen VITAMIN E PO Take 1 tablet by mouth daily.    Marland Kitchen dicyclomine (BENTYL) 10 MG capsule Take 1 capsule (10 mg total) by  mouth 3 (three) times daily as needed (for cramps). 90 capsule 1  . ranitidine (ZANTAC) 150 MG tablet Take 1 tablet (150 mg total) by mouth daily. 30 tablet 6   No current facility-administered medications for this visit.   Allergies  Allergen Reactions  . Dexilant [Dexlansoprazole]     REACTION: pressure in ears  . Prevacid [Lansoprazole]     REACTION: pressure in ears  . Prilosec [Omeprazole]     REACTION: pressure in ears      Review of Systems: Gen: Denies any fever, chills, sweats, anorexia, fatigue, weakness, malaise, weight loss, and sleep disorder CV: Denies chest pain, angina, palpitations, syncope, orthopnea, PND, peripheral edema, and claudication. Resp: Denies dyspnea at rest, dyspnea with exercise, cough, sputum, wheezing, coughing up blood, and pleurisy. GI: Denies vomiting blood, jaundice, and fecal incontinence.   Denies dysphagia or odynophagia. GU : Denies urinary burning, blood in urine, urinary frequency, urinary hesitancy, nocturnal urination, and urinary incontinence. MS: Denies joint pain, limitation of movement, and swelling, stiffness, low back pain, extremity pain. Denies muscle weakness, cramps, atrophy.  Derm: Denies rash, itching, dry skin, hives, moles, warts, or unhealing ulcers.  Psych: Denies depression, anxiety, memory loss, suicidal ideation, hallucinations, paranoia, and confusion. Heme: Denies bruising, bleeding, and enlarged lymph nodes. Neuro:  Denies any headaches, dizziness, paresthesia Endo:  Denies any problems with DM, thyroid, adrenal  Endoscopies: Endoscopy 01/02/2010 showed a stricture in the midesophagus hiatal hernia normal duodenal folds and a normal stomach. Colonoscopy on 01/02/2010 showed moderate diverticulosis was found in the sigmoid to descending colon segments. A sessile polyp was found in the descending colon.    Physical Exam: General: Pleasant, well developed female in no acute distress Head: Normocephalic and  atraumatic Eyes:  sclerae anicteric, conjunctiva pink  Ears: Normal auditory acuity Lungs: Clear throughout to auscultation Heart: Regular rate and rhythm Abdomen: Soft, non distended, tender LLQ and suprapubic area with guarding but no rebound,. No masses, no hepatomegaly. Normal bowel sounds. No CVAT. Musculoskeletal: Symmetrical with no gross deformities  Extremities: No edema  Neurological: Alert oriented x 4, grossly nonfocal Psychological:  Alert and cooperative. Normal mood and affect  Assessment and Recommendations: #1. Left lower quadrant pain. This may be due to an area of spastic diverticular disease as she has had multiple dietary indiscretions and has known diverticulosis. She's been advised to adhere to a low-residue low-fat diet. A CT of the abdomen and pelvis will be obtained to evaluate for diverticulitis. A CBC will also be obtained. If  her CBC has an abnormal white blood count or her CT shows diverticulitis she will be treated accordingly. If her CT is nonrevealing she will be treated as spastic diverticular disease. She has been given a prescription for Bentyl 10 mg 1 by mouth 3 times a day when necessary cramping.  #2. GERD and antireflux regimen has been reviewed. She will be given a trial of ranitidine 150 mg by mouth daily. She will follow up in 6 weeks to assess response, sooner if needed.    Chaynce Schafer, Vita Barley PA-C 07/26/2014,  Addendum: Reviewed and agree with initial management. If CT unrevealing can proceed to colonoscopy which is due in a few months anyway (last exam 2011 with polyp removed) Jerene Bears, MD

## 2014-07-29 ENCOUNTER — Ambulatory Visit (HOSPITAL_COMMUNITY)
Admission: RE | Admit: 2014-07-29 | Discharge: 2014-07-29 | Disposition: A | Discharge status: Home / Self Care | Payer: Medicare Other | Source: Ambulatory Visit | Attending: Obstetrics & Gynecology | Admitting: Obstetrics & Gynecology

## 2014-07-29 DIAGNOSIS — Z1231 Encounter for screening mammogram for malignant neoplasm of breast: Secondary | ICD-10-CM | POA: Insufficient documentation

## 2014-08-02 ENCOUNTER — Ambulatory Visit (HOSPITAL_COMMUNITY)
Admission: RE | Admit: 2014-08-02 | Discharge: 2014-08-02 | Disposition: A | Discharge status: Home / Self Care | Payer: Medicare Other | Source: Ambulatory Visit | Attending: Physician Assistant | Admitting: Physician Assistant

## 2014-08-02 ENCOUNTER — Encounter (HOSPITAL_COMMUNITY): Payer: Self-pay

## 2014-08-02 DIAGNOSIS — R1032 Left lower quadrant pain: Secondary | ICD-10-CM | POA: Insufficient documentation

## 2014-08-02 DIAGNOSIS — K449 Diaphragmatic hernia without obstruction or gangrene: Secondary | ICD-10-CM | POA: Diagnosis not present

## 2014-08-02 DIAGNOSIS — K5730 Diverticulosis of large intestine without perforation or abscess without bleeding: Secondary | ICD-10-CM | POA: Diagnosis not present

## 2014-08-02 DIAGNOSIS — N281 Cyst of kidney, acquired: Secondary | ICD-10-CM | POA: Diagnosis not present

## 2014-08-02 DIAGNOSIS — K219 Gastro-esophageal reflux disease without esophagitis: Secondary | ICD-10-CM

## 2014-08-02 MED ORDER — IOHEXOL 300 MG/ML  SOLN
100.0000 mL | Freq: Once | INTRAMUSCULAR | Status: AC | PRN
  Administered 2014-08-02: 100 mL via INTRAVENOUS

## 2014-08-11 ENCOUNTER — Telehealth: Payer: Self-pay

## 2014-08-11 NOTE — Telephone Encounter (Signed)
[facility] [place] [facility]

## 2014-09-06 ENCOUNTER — Ambulatory Visit (INDEPENDENT_AMBULATORY_CARE_PROVIDER_SITE_OTHER): Payer: Medicare Other | Admitting: Physician Assistant

## 2014-09-06 ENCOUNTER — Encounter: Payer: Self-pay | Admitting: Physician Assistant

## 2014-09-06 VITALS — BP 130/72 | HR 84 | Ht 63.0 in | Wt 180.6 lb

## 2014-09-06 DIAGNOSIS — K219 Gastro-esophageal reflux disease without esophagitis: Secondary | ICD-10-CM | POA: Diagnosis not present

## 2014-09-06 DIAGNOSIS — Z86010 Personal history of colonic polyps: Secondary | ICD-10-CM

## 2014-09-06 DIAGNOSIS — K5730 Diverticulosis of large intestine without perforation or abscess without bleeding: Secondary | ICD-10-CM | POA: Diagnosis not present

## 2014-09-06 DIAGNOSIS — R1314 Dysphagia, pharyngoesophageal phase: Secondary | ICD-10-CM | POA: Diagnosis not present

## 2014-09-06 MED ORDER — RANITIDINE HCL 300 MG PO CAPS: 300.0000 mg | ORAL_CAPSULE | Freq: Every evening | ORAL | Status: DC

## 2014-09-06 NOTE — Progress Notes (Addendum)
Patient ID: Toni [name], female   DOB: [birth year], 69 y.o.   MRN: [id number]     History of Present Illness: Toni [name] is a delightful 69 year old female who was last seen here on 07/26/2014 at which time she was complaining of left lower quadrant pain. CT reveals sigmoid diverticulosis without diverticulitis and white blood cell count was normal she was given a trial of Bentyl 10 mg 3 times a day. Mickie Hillier today for follow-up and says she feels significantly better. He is moving her bowels on a daily basis and has an occasional cramp prior to defecation, relieved with defecation but no constant pain. She is due for a colonoscopy later this year but would like to schedule that sooner area in addition, she has a long-standing history of GERD and has had esophageal strictures dilated in the past. She has not been using her ranitidine and is having daily heartburn. She is having intermittent dysphagia again as well and would like to schedule an EGD as she is worried about Barrett's esophagus.    Past Medical History  Diagnosis Date  . Aortic sclerosis 2000    on ECHO  . Stricture and stenosis of esophagus     dilation X 3  . Esophageal reflux   . Diverticulosis of colon (without mention of hemorrhage)   . Colon polyps     Dr Sharlett Iles  . Hyperlipidemia 1999    LDL 158, resolved with diet  . Eye abnormality     OD ocular freckle  . SVT (supraventricular tachycardia)     slow pathway ablated  . Premature ventricular contractions     Past Surgical History  Procedure Laterality Date  . Ovarian wedge resection      to allow pregnancy  . Knee arthroscopy      bilateral  . Ankle surgery      right  . Tonsillectomy    . Colonoscopy w/ polypectomy  2011    Dr Sharlett Iles  . Supraventricular tachycardia ablation N/A 06/08/2013    Procedure: SUPRAVENTRICULAR TACHYCARDIA ABLATION;  Surgeon: Coralyn Mark, MD;  Location: [facility] CATH LAB;  Service: Cardiovascular;  Laterality: N/A;   Family  History  Problem Relation Age of Onset  . Stroke Father 44  . Arthritis Mother     OA  . Hypertension Mother   . Hypothyroidism Mother   . Hypothyroidism Sister   . Heart attack Sister 73    smoker  . Osteopenia Sister   . Ovarian cancer Sister   . Hypertrophic cardiomyopathy Sister   . Diabetes Maternal Grandmother   . Stroke Mother 95    ischemic  . Colon cancer Neg Hx   . Esophageal cancer Neg Hx   . Liver disease Neg Hx    History  Substance Use Topics  . Smoking status: Never Smoker   . Smokeless tobacco: Never Used  . Alcohol Use: 0.0 oz/week    0 Standard drinks or equivalent per week     Comment: socially, once / month   Current Outpatient Prescriptions  Medication Sig Dispense Refill  . aspirin 81 MG chewable tablet Chew 81 mg by mouth daily.    Marland Kitchen CALCIUM CITRATE PO Take 1 tablet by mouth daily.     . cholecalciferol (VITAMIN D) 1000 UNITS tablet Take 1,000 Units by mouth daily.      . Multiple Vitamin (MULTIVITAMIN WITH MINERALS) TABS Take 1 tablet by mouth daily.    . Omega-3 Fatty Acids (FISH OIL) 1000 MG CAPS  Take 1,000 mg by mouth daily.     . ranitidine (ZANTAC) 150 MG tablet Take 1 tablet (150 mg total) by mouth daily. 30 tablet 6  . VITAMIN E PO Take 1 tablet by mouth daily.    . ranitidine (ZANTAC) 300 MG capsule Take 1 capsule (300 mg total) by mouth every evening. 90 capsule 3   No current facility-administered medications for this visit.   Allergies  Allergen Reactions  . Dexilant [Dexlansoprazole]     REACTION: pressure in ears  . Prevacid [Lansoprazole]     REACTION: pressure in ears  . Prilosec [Omeprazole]     REACTION: pressure in ears      Review of Systems: Gen: Denies any fever, chills, sweats, anorexia, fatigue, weakness, malaise, weight loss, and sleep disorder CV: Denies chest pain, angina, palpitations, syncope, orthopnea, PND, peripheral edema, and claudication. Resp: Denies dyspnea at rest, dyspnea with exercise, cough,  sputum, wheezing, coughing up blood, and pleurisy. GI: Denies vomiting blood, jaundice, and fecal incontinence.  Intermittent dysphagia to breads and dry foods. GU : Denies urinary burning, blood in urine, urinary frequency, urinary hesitancy, nocturnal urination, and urinary incontinence. MS: Denies joint pain, limitation of movement, and swelling, stiffness, low back pain, extremity pain. Denies muscle weakness, cramps, atrophy.  Derm: Denies rash, itching, dry skin, hives, moles, warts, or unhealing ulcers.  Psych: Denies depression, anxiety, memory loss, suicidal ideation, hallucinations, paranoia, and confusion. Heme: Denies bruising, bleeding, and enlarged lymph nodes. Neuro:  Denies any headaches, dizziness, paresthesia Endo:  Denies any problems with DM, thyroid, adrenal  LAB RESULTS: CBC on 07/26/2014 had white blood cells 6.5, hemoglobin 14.4, hematocrit 41.8, platelets 256,000.   Studies:  Result     CLINICAL DATA: Left lower quadrant pain for 4 weeks, history of diverticulitis  EXAM: CT ABDOMEN AND PELVIS WITH CONTRAST  TECHNIQUE: Multidetector CT imaging of the abdomen and pelvis was performed using the standard protocol following bolus administration of intravenous contrast.  CONTRAST: 170mL OMNIPAQUE IOHEXOL 300 MG/ML SOLN  COMPARISON: None.  FINDINGS: Sagittal images of the spine shows multilevel degenerative changes thoracolumbar spine. There is about 6 mm anterolisthesis L4 on L5 vertebral body. Significant disc space flattening with vacuum disc phenomenon at L4-L5 level. Significant disc space flattening with endplate sclerotic changes at L5-S1 level. Moderate disc space flattening with endplate sclerotic changes at L3-L4 level.  Enhanced liver is unremarkable. No calcified gallstones are noted within gallbladder. Enhanced pancreas, spleen and adrenal glands are unremarkable. Enhanced kidneys are symmetrical in size. No hydronephrosis or  hydroureter. Bilateral mild prominent extrarenal pelvis. Parapelvic cyst is noted in midpole of the left kidney measures 1.8 cm. Small central cyst in midpole of the right kidney measures 9 mm.  Delayed renal images shows bilateral renal symmetrical excretion. Bilateral visualized proximal ureter is unremarkable.  No aortic aneurysm. No small bowel obstruction. There is no ascites or free air. No adenopathy. There is a low lying cecum. Normal retrocecal appendix is noted.  Few diverticula are noted distal left colon. Sigmoid colon diverticula are noted. There is no evidence of acute diverticulitis. There is redundant sigmoid colon looping superiorly in mid left abdomen. Moderate stool noted in transverse colon and descending colon. Moderate gas noted within rectum. The uterus is atrophic. No adnexal masses noted.  Small hiatal hernia.  IMPRESSION: 1. No acute inflammatory process within abdomen or pelvis. 2. No hydronephrosis or hydroureter. 3. Small hiatal hernia. 4. There is a low lying cecum. No pericecal inflammation. Normal retrocecal appendix. 5. Few  diverticula are noted in descending colon. Multiple sigmoid colon diverticula. No evidence of acute diverticulitis. There is a redundant sigmoid colon looping superiorly in mid left abdomen 6. Moderate gas noted within rectum. 7. Degenerative changes thoracolumbar spine.   Electronically Signed  By: Lahoma Crocker M.D.  On: 08/02/2014 10:29       Physical Exam: General: Pleasant, well developed female in no acute distress Head: Normocephalic and atraumatic Eyes:  sclerae anicteric, conjunctiva pink  Ears: Normal auditory acuity Lungs: Clear throughout to auscultation Heart: Regular rate and rhythm Abdomen: Soft, non distended, non-tender. No masses, no hepatomegaly. Normal bowel sounds Musculoskeletal: Symmetrical with no gross deformities  Extremities: No edema  Neurological: Alert oriented x 4, grossly  nonfocal Psychological:  Alert and cooperative. Normal mood and affect  Assessment and Recommendations :68 year old female status post an episode of left lower quadrant pain, found to have a normal CT aside from diverticulosis and a normal CBC here for follow-up. She is feeling much better on a high-fiber low-fat diet and we'll continue this regimen. She would like to schedule her surveillance colonoscopy due to her history of polyps. She has also had an exacerbation of her GERD and is having intermittent dysphagia. An antireflux regimen has been reviewed and she will restart ranitidine 300 mg at at bedtime.The risks, benefits, and alternatives to colonoscopy with possible biopsy and possible polypectomy were discussed with the patient and they consent to proceed. The risks, benefits, and alternatives to endoscopy with possible biopsy and possible dilation were discussed with the patient and they consent to proceed. Further recommendations will be made pending the findings of the above. Her procedures will be scheduled with Dr. Hilarie Fredrickson.  Addendum: Reviewed and agree with management. Jerene Bears, MD          Morris Longenecker, Vita Barley PA-C 09/06/2014,

## 2014-09-06 NOTE — Patient Instructions (Signed)
You have been scheduled for an endoscopy and colonoscopy. Please follow the written instructions given to you at your visit today. Please pick up your prep supplies at the pharmacy within the next 1-3 days. If you use inhalers (even only as needed), please bring them with you on the day of your procedure. Your physician has requested that you go to www.startemmi.com and enter the access code given to you at your visit today. This web site gives a general overview about your procedure. However, you should still follow specific instructions given to you by our office regarding your preparation for the procedure. We have sent  medications to your pharmacy for you to pick up at your convenience. CC:  Unice Cobble MD

## 2014-09-20 ENCOUNTER — Telehealth: Payer: Self-pay | Admitting: Physician Assistant

## 2014-09-20 NOTE — Telephone Encounter (Signed)
Lori Hvozdovic, PA-C is it ok to send the rx as patient requested?

## 2014-09-21 MED ORDER — RANITIDINE HCL 150 MG PO CAPS: 150.0000 mg | ORAL_CAPSULE | Freq: Two times a day (BID) | ORAL | Status: DC

## 2014-09-21 NOTE — Telephone Encounter (Signed)
Patient notified that Rx has been sent. 

## 2014-09-21 NOTE — Telephone Encounter (Signed)
Rx sent to pharmacy. Left a message for patient to call back.

## 2014-09-21 NOTE — Telephone Encounter (Signed)
That should be fine. She has EGD sched in May

## 2014-09-22 ENCOUNTER — Telehealth: Payer: Self-pay | Admitting: Physician Assistant

## 2014-09-22 MED ORDER — RANITIDINE HCL 150 MG PO TABS: 150.0000 mg | ORAL_TABLET | Freq: Every day | ORAL | Status: DC

## 2014-09-22 MED ORDER — RANITIDINE HCL 150 MG PO TABS: 150.0000 mg | ORAL_TABLET | Freq: Two times a day (BID) | ORAL | Status: DC

## 2014-09-22 NOTE — Telephone Encounter (Signed)
Spoke with patient and she states her insurance will only pay for tablets of Zantac 90 days supply. Reordered Zantac for her.

## 2014-10-14 ENCOUNTER — Encounter: Payer: Self-pay | Admitting: Internal Medicine

## 2014-10-14 ENCOUNTER — Ambulatory Visit (AMBULATORY_SURGERY_CENTER): Payer: Medicare Other | Admitting: Internal Medicine

## 2014-10-14 VITALS — BP 132/64 | HR 63 | Temp 98.1°F | Resp 20 | Ht 63.0 in | Wt 180.0 lb

## 2014-10-14 DIAGNOSIS — Z86010 Personal history of colonic polyps: Secondary | ICD-10-CM

## 2014-10-14 DIAGNOSIS — K219 Gastro-esophageal reflux disease without esophagitis: Secondary | ICD-10-CM

## 2014-10-14 DIAGNOSIS — K2270 Barrett's esophagus without dysplasia: Secondary | ICD-10-CM

## 2014-10-14 DIAGNOSIS — D122 Benign neoplasm of ascending colon: Secondary | ICD-10-CM

## 2014-10-14 HISTORY — PX: COLONOSCOPY: SHX174

## 2014-10-14 MED ORDER — SODIUM CHLORIDE 0.9 % IV SOLN: 500.0000 mL | INTRAVENOUS | Status: DC

## 2014-10-14 NOTE — Progress Notes (Signed)
Called to room to assist during endoscopic procedure.  Patient ID and intended procedure confirmed with present staff. Received instructions for my participation in the procedure from the performing physician.  

## 2014-10-14 NOTE — Patient Instructions (Addendum)
YOU HAD AN ENDOSCOPIC PROCEDURE TODAY AT [facility]:   Refer to the procedure report that was given to you for any specific questions about what was found during the examination.  If the procedure report does not answer your questions, please call your gastroenterologist to clarify.  If you requested that your care partner not be given the details of your procedure findings, then the procedure report has been included in a sealed envelope for you to review at your convenience later.  YOU SHOULD EXPECT: Some feelings of bloating in the abdomen. Passage of more gas than usual.  Walking can help get rid of the air that was put into your GI tract during the procedure and reduce the bloating. If you had a lower endoscopy (such as a colonoscopy or flexible sigmoidoscopy) you may notice spotting of blood in your stool or on the toilet paper. If you underwent a bowel prep for your procedure, you may not have a normal bowel movement for a few days.  Please Note:  You might notice some irritation and congestion in your nose or some drainage.  This is from the oxygen used during your procedure.  There is no need for concern and it should clear up in a day or so.  SYMPTOMS TO REPORT IMMEDIATELY:   Following lower endoscopy (colonoscopy or flexible sigmoidoscopy):  Excessive amounts of blood in the stool  Significant tenderness or worsening of abdominal pains  Swelling of the abdomen that is new, acute  Fever of 100F or higher    For urgent or emergent issues, a gastroenterologist can be reached at any hour by calling [phone].   DIET: Your first meal following the procedure should be a small meal and then it is ok to progress to your normal diet. Heavy or fried foods are harder to digest and may make you feel nauseous or bloated.  Likewise, meals heavy in dairy and vegetables can increase bloating.  Drink plenty of fluids but you should avoid alcoholic beverages for 24  hours.  ACTIVITY:  You should plan to take it easy for the rest of today and you should NOT DRIVE or use heavy machinery until tomorrow (because of the sedation medicines used during the test).    FOLLOW UP: Our staff will call the number listed on your records the next business day following your procedure to check on you and address any questions or concerns that you may have regarding the information given to you following your procedure. If we do not reach you, we will leave a message.  However, if you are feeling well and you are not experiencing any problems, there is no need to return our call.  We will assume that you have returned to your regular daily activities without incident.  If any biopsies were taken you will be contacted by phone or by letter within the next 1-3 weeks.  Please call us at [phone] if you have not heard about the biopsies in 3 weeks.    SIGNATURES/CONFIDENTIALITY: You and/or your care partner have signed paperwork which will be entered into your electronic medical record.  These signatures attest to the fact that that the information above on your After Visit Summary has been reviewed and is understood.  Full responsibility of the confidentiality of this discharge information lies with you and/or your care-partner.   FOLLOW ANTI REFLUX REGIMEN GIVEN TO YOU TODAY  CONTINUE CURRENT MEDICATIONS   AWAIT PATHOLOGY RESULTS   INFORMATION ON POLYPS,DIVERTICULOSIS ,&  HIGH FIBER DIET GIVEN TO YOU TODAY  CHANGE ZANTAC TO 150 MG IN AM & 150 MG IN PM

## 2014-10-14 NOTE — Progress Notes (Signed)
Report to PACU, RN, vss, BBS= Clear.  

## 2014-10-14 NOTE — Op Note (Addendum)
[place]  [address] [place], [address]   COLONOSCOPY PROCEDURE REPORT  PATIENT: Toni [name], Toni [name]  MR#: [id number] BIRTHDATE: 10-Mar-1946 , 63  yrs. old GENDER: female ENDOSCOPIST: Jerene Bears, MD PROCEDURE DATE:  10/14/2014 PROCEDURE:   Colonoscopy with snare polypectomy First Screening Colonoscopy - Avg.  risk and is 50 yrs.  old or older - No.  Prior Negative Screening - Now for repeat screening. N/A  History of Adenoma - Now for follow-up colonoscopy & has been > or = to 3 yrs.  Yes hx of adenoma.  Has been 3 or more years since last colonoscopy.  Polyps removed today? Yes ASA CLASS:   Class II INDICATIONS:Surveillance due to prior colonic neoplasia. MEDICATIONS: Propofol 100 mg IV, Monitored anesthesia care, and Residual sedation present  DESCRIPTION OF PROCEDURE:   After the risks benefits and alternatives of the procedure were thoroughly explained, informed consent was obtained.  The digital rectal exam revealed no rectal mass.   The LB [id number]  endoscope was introduced through the anus and advanced to the cecum, which was identified by both the appendix and ileocecal valve. No adverse events experienced. The quality of the prep was good.  (Suprep was used)  The instrument was then slowly withdrawn as the colon was fully examined. Estimated blood loss is zero unless otherwise noted in this procedure report.   COLON FINDINGS: Two sessile polyps ranging between 3-59mm in size were found in the ascending colon.  Polypectomies were performed with a cold snare.  The resection was complete, the polyp tissue was completely retrieved and sent to histology.   There was mild diverticulosis noted in the sigmoid colon.  Retroflexed views revealed internal hemorrhoids. The time to cecum = 4.4 Withdrawal time = 10.3   The scope was withdrawn and the procedure completed. COMPLICATIONS: There were no immediate complications.  ENDOSCOPIC IMPRESSION: 1.    Two sessile polyps ranging between 3-47mm in size were found in the ascending colon; polypectomies were performed with a cold snare  2.   Mild diverticulosis was noted in the sigmoid colon  RECOMMENDATIONS: 1.  Await pathology results 2.  High fiber diet 3.  Repeat Colonoscopy in 5 years. 4.  You will receive a letter within 1-2 weeks with the results of your biopsy as well as final recommendations.  Please call my office if you have not received a letter after 3 weeks.  eSigned:  Jerene Bears, MD 10/14/2014 3:41 PM Revised: 10/14/2014 3:41 PM  cc: Hendricks Limes, MD and The Patient

## 2014-10-14 NOTE — Op Note (Signed)
[place]  [address]. [place], [address]   ENDOSCOPY PROCEDURE REPORT  PATIENT: Toni [name], Toni [name]  MR#: [id number] BIRTHDATE: 11/01/1945 , 25  yrs. old GENDER: female ENDOSCOPIST: Jerene Bears, MD PROCEDURE DATE:  10/14/2014 PROCEDURE:  EGD w/ biopsy ASA CLASS:     Class II INDICATIONS:  history of GERD. MEDICATIONS: Monitored anesthesia care and Propofol 200 mg IV TOPICAL ANESTHETIC: none  DESCRIPTION OF PROCEDURE: After the risks benefits and alternatives of the procedure were thoroughly explained, informed consent was obtained.  The LB [id number] endoscope was introduced through the mouth and advanced to the second portion of the duodenum , Without limitations.  The instrument was slowly withdrawn as the mucosa was fully examined.  ESOPHAGUS: The mucosa of the esophagus appeared normal.  At the GE junction there is a small hiatal hernia with mild inflammation seen on the gastric side of the Z-line.  No Barrett's mucosa was seen. Cold forcep biopsies obtained from this area to exclude dysplasia.  STOMACH: A 2 cm hiatal hernia was noted.   The mucosa of the stomach appeared normal.  DUODENUM: The duodenal mucosa showed no abnormalities in the bulb and 2nd part of the duodenum.  Retroflexed views revealed other abnormalities.     The scope was then withdrawn from the patient and the procedure completed.  COMPLICATIONS: There were no immediate complications.  ENDOSCOPIC IMPRESSION: 1.   The mucosa of the esophagus appeared normal 2.   2 cm hiatal hernia with mild inflammation; multiple biopsies 3.   The mucosa of the stomach appeared normal 4.   The duodenal mucosa showed no abnormalities in the bulb and 2nd part of the duodenum  RECOMMENDATIONS: 1.  Await biopsy results 2.  Anti-reflux regimen to be follow 3.  Continue current medications  eSigned:  Jerene Bears, MD 10/14/2014 3:34 PM  MC:RFVOHKG Chanda Busing, MD and The Patient

## 2014-10-17 ENCOUNTER — Telehealth: Payer: Self-pay | Admitting: *Deleted

## 2014-10-17 NOTE — Telephone Encounter (Signed)
  Follow up Call-  Call back number 10/14/2014  Post procedure Call Back phone  # [phone]  Permission to leave phone message Yes     Patient questions:  Do you have a fever, pain , or abdominal swelling? No. Pain Score  0 *  Have you tolerated food without any problems? Yes.    Have you been able to return to your normal activities? Yes.    Do you have any questions about your discharge instructions: Diet   No. Medications  No. Follow up visit  No.  Do you have questions or concerns about your Care? No.  Actions: * If pain score is 4 or above: No action needed, pain <4.

## 2014-10-25 ENCOUNTER — Telehealth: Payer: Self-pay | Admitting: Internal Medicine

## 2014-10-25 NOTE — Telephone Encounter (Signed)
Contacted patient to discuss pathology results. Left message on voicemail. I asked her to call me back so we could discuss.

## 2014-10-26 ENCOUNTER — Encounter: Payer: Self-pay | Admitting: Internal Medicine

## 2014-10-26 ENCOUNTER — Telehealth: Payer: Self-pay | Admitting: Internal Medicine

## 2014-10-26 NOTE — Telephone Encounter (Signed)
See result note.  

## 2014-12-20 ENCOUNTER — Ambulatory Visit (AMBULATORY_SURGERY_CENTER): Payer: Self-pay

## 2014-12-20 VITALS — Ht 63.0 in | Wt 178.4 lb

## 2014-12-20 DIAGNOSIS — K2270 Barrett's esophagus without dysplasia: Secondary | ICD-10-CM

## 2014-12-20 NOTE — Progress Notes (Signed)
Per pt, no allergies to soy or egg products.Pt not taking any weight loss meds or using  O2 at home. 

## 2014-12-28 ENCOUNTER — Ambulatory Visit (AMBULATORY_SURGERY_CENTER): Payer: Medicare Other | Admitting: Internal Medicine

## 2014-12-28 ENCOUNTER — Encounter: Payer: Medicare Other | Admitting: Internal Medicine

## 2014-12-28 ENCOUNTER — Encounter: Payer: Self-pay | Admitting: Internal Medicine

## 2014-12-28 VITALS — BP 124/57 | HR 66 | Temp 97.0°F | Resp 26 | Ht 63.0 in | Wt 178.0 lb

## 2014-12-28 DIAGNOSIS — K208 Other esophagitis: Secondary | ICD-10-CM | POA: Diagnosis not present

## 2014-12-28 DIAGNOSIS — Q409 Congenital malformation of upper alimentary tract, unspecified: Secondary | ICD-10-CM

## 2014-12-28 DIAGNOSIS — K219 Gastro-esophageal reflux disease without esophagitis: Secondary | ICD-10-CM

## 2014-12-28 DIAGNOSIS — Q403 Congenital malformation of stomach, unspecified: Secondary | ICD-10-CM

## 2014-12-28 DIAGNOSIS — K2950 Unspecified chronic gastritis without bleeding: Secondary | ICD-10-CM | POA: Diagnosis not present

## 2014-12-28 MED ORDER — SODIUM CHLORIDE 0.9 % IV SOLN: 500.0000 mL | INTRAVENOUS | Status: DC

## 2014-12-28 NOTE — Progress Notes (Signed)
Transferred to recovery room. A/O x3, pleased with MAC.  VSS.  Report to June, RN.

## 2014-12-28 NOTE — Progress Notes (Signed)
Called to room to assist during endoscopic procedure.  Patient ID and intended procedure confirmed with present staff. Received instructions for my participation in the procedure from the performing physician.  

## 2014-12-28 NOTE — Patient Instructions (Signed)
YOU HAD AN ENDOSCOPIC PROCEDURE TODAY AT THE [facility]:   Refer to the procedure report that was given to you for any specific questions about what was found during the examination.  If the procedure report does not answer your questions, please call your gastroenterologist to clarify.  If you requested that your care partner not be given the details of your procedure findings, then the procedure report has been included in a sealed envelope for you to review at your convenience later.  YOU SHOULD EXPECT: Some feelings of bloating in the abdomen. Passage of more gas than usual.  Walking can help get rid of the air that was put into your GI tract during the procedure and reduce the bloating. If you had a lower endoscopy (such as a colonoscopy or flexible sigmoidoscopy) you may notice spotting of blood in your stool or on the toilet paper. If you underwent a bowel prep for your procedure, you may not have a normal bowel movement for a few days.  Please Note:  You might notice some irritation and congestion in your nose or some drainage.  This is from the oxygen used during your procedure.  There is no need for concern and it should clear up in a day or so.  SYMPTOMS TO REPORT IMMEDIATELY:   Following lower endoscopy (colonoscopy or flexible sigmoidoscopy):  Excessive amounts of blood in the stool  Significant tenderness or worsening of abdominal pains  Swelling of the abdomen that is new, acute  Fever of 100F or higher   Following upper endoscopy (EGD)  Vomiting of blood or coffee ground material  New chest pain or pain under the shoulder blades  Painful or persistently difficult swallowing  New shortness of breath  Fever of 100F or higher  Black, tarry-looking stools  For urgent or emergent issues, a gastroenterologist can be reached at any hour by calling [phone].   DIET: Your first meal following the procedure should be a small meal and then it is ok to progress to  your normal diet. Heavy or fried foods are harder to digest and may make you feel nauseous or bloated.  Likewise, meals heavy in dairy and vegetables can increase bloating.  Drink plenty of fluids but you should avoid alcoholic beverages for 24 hours.  ACTIVITY:  You should plan to take it easy for the rest of today and you should NOT DRIVE or use heavy machinery until tomorrow (because of the sedation medicines used during the test).    FOLLOW UP: Our staff will call the number listed on your records the next business day following your procedure to check on you and address any questions or concerns that you may have regarding the information given to you following your procedure. If we do not reach you, we will leave a message.  However, if you are feeling well and you are not experiencing any problems, there is no need to return our call.  We will assume that you have returned to your regular daily activities without incident.  If any biopsies were taken you will be contacted by phone or by letter within the next 1-3 weeks.  Please call us at [phone] if you have not heard about the biopsies in 3 weeks.    SIGNATURES/CONFIDENTIALITY: You and/or your care partner have signed paperwork which will be entered into your electronic medical record.  These signatures attest to the fact that that the information above on your After Visit Summary has been reviewed   and is understood.  Full responsibility of the confidentiality of this discharge information lies with you and/or your care-partner. 

## 2014-12-28 NOTE — Op Note (Signed)
[place]  [address]. [place], [address]   ENDOSCOPY PROCEDURE REPORT  PATIENT: Toni [name], Toni [name]  MR#: [id number] BIRTHDATE: August 12, 1945 , 65  yrs. 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 MEDICATIONS: Monitored anesthesia care and Propofol 170 mg IV TOPICAL ANESTHETIC: none  DESCRIPTION OF PROCEDURE: After the risks benefits and alternatives of the procedure were thoroughly explained, informed consent was obtained.  The LB [id number] endoscope was introduced through the mouth and advanced to the second portion of the duodenum , Without limitations.  The instrument was slowly withdrawn as the mucosa was fully examined.   ESOPHAGUS: Small inlet patch in the proximal esophagus at 18 cm without nodularity.   Tortuous distal esophagus but otherwise normal mucosa.   The Z line is located at 37 cm and there is no evidence of Barrett's mucosa.  STOMACH: A 3 cm hiatal hernia was noted.   Mild inflammatory change, visualized under white light and narrow band imaging in the gastric cardia within the hiatal hernia sac.  This was best visualized in retroflexion.  Multiple biopsies were obtained using cold forceps from gastric cardia mucosa just distal to the Z line to rule out dysplasia and placed in jar 1.  Additional biopsies obtained 2 and 3 cm distal to the GE junction and placed in jar 2.   The stomach mucosa otherwise appeared normal.  DUODENUM: The duodenal mucosa showed no abnormalities in the bulb and 2nd part of the duodenum.  Retroflexed views revealed as previously described.     The scope was then withdrawn from the patient and the procedure completed.  COMPLICATIONS: There were no immediate complications.    ENDOSCOPIC IMPRESSION: 1.   Small inlet  patch in the proximal esophagus at 18 cm without nodularity 2.   Tortuous distal esophagus but otherwise normal mucosa 3.   3 cm hiatal hernia 4.   Mild inflammatory change in the gastric cardia within the hiatal hernia sac.  Multiple biopsies were obtained using cold forceps from gastric cardia mucosa as above 5.   The stomach otherwise appeared normal 6.   The duodenal mucosa showed no abnormalities in the bulb and 2nd part of the duodenum  RECOMMENDATIONS: 1.  Await biopsy results 2.  Continue current medications  REPEAT EXAM: for EGD pending biopsy results.  eSigned:  Jerene Bears, MD 12/28/2014 9:18 AM    JI:RCVELFY Chanda Busing, MD and The Patient  PATIENT NAME:  Toni [name] MR#: [id number]

## 2014-12-29 ENCOUNTER — Telehealth: Payer: Self-pay | Admitting: Emergency Medicine

## 2014-12-29 NOTE — Telephone Encounter (Signed)
Left message, no identifier, f/u

## 2015-01-04 ENCOUNTER — Encounter: Payer: Self-pay | Admitting: Internal Medicine

## 2015-03-10 ENCOUNTER — Encounter: Payer: Self-pay | Admitting: Internal Medicine

## 2015-03-10 ENCOUNTER — Ambulatory Visit (INDEPENDENT_AMBULATORY_CARE_PROVIDER_SITE_OTHER): Payer: Medicare Other | Admitting: Internal Medicine

## 2015-03-10 VITALS — BP 130/74 | HR 87 | Ht 63.0 in | Wt 181.0 lb

## 2015-03-10 DIAGNOSIS — Z86010 Personal history of colonic polyps: Secondary | ICD-10-CM | POA: Diagnosis not present

## 2015-03-10 DIAGNOSIS — Z0000 Encounter for general adult medical examination without abnormal findings: Secondary | ICD-10-CM

## 2015-03-10 DIAGNOSIS — R7309 Other abnormal glucose: Secondary | ICD-10-CM

## 2015-03-10 DIAGNOSIS — Z780 Asymptomatic menopausal state: Secondary | ICD-10-CM | POA: Diagnosis not present

## 2015-03-10 DIAGNOSIS — E785 Hyperlipidemia, unspecified: Secondary | ICD-10-CM

## 2015-03-10 NOTE — Patient Instructions (Signed)
  Your next office appointment will be determined based upon review of your pending labs  and bone density  Those written interpretation of the lab results and instructions will be transmitted to you by My Chart   Critical results will be called.   Followup as needed for any active or acute issue. Please report any significant change in your symptoms.

## 2015-03-10 NOTE — Progress Notes (Signed)
   Subjective:    Patient ID: Toni [name], female    DOB: [birth year], 69 y.o.   MRN: [id number]  HPI Medicare Wellness Visit: Psychosocial and medical history were reviewed as required by Medicare (history related to abuse, antisocial behavior , firearm risk). Social history: Caffeine: 2 cups per day Alcohol:  One per week Tobacco use: Never Exercise: Swimming or walking 5 days a week Personal safety/fall risk: No Limitations of activities of daily living: No Seatbelt/ smoke alarm use: Yes Healthcare Power of Attorney/Living Will status and End of Life process assessment : Up-to-date Ophthalmologic exam status: Up-to-date Hearing evaluation status: Conducted today; whisper heard at 6 feet Orientation: Oriented X 3  Memory and recall: Good Spelling testing: Good Depression/anxiety assessment: No Foreign travel history: [place] less than 3 months Immunization status for influenza/pneumonia/ shingles /tetanus: Up-to-date Transfusion history: No Preventive health care maintenance status: Colonoscopy/BMD/mammogram/Pap as per protocol/standard care: Up-to-date except for bone density Dental care: Every 12 months Chart reviewed and updated. Active issues reviewed and addressed as documented below.   She is on a heart healthy diet. Exercise is noted above.  She's compliant with her ranitidine.  She had 2 endoscopies in June and August; initial findings suggested Barrett's. This was not found on follow-up. Colonoscopy in June revealed 2 polyps and diverticulosis  He has occasional discomfort in the left lower quadrant upon awakening if her bladder is full. She has no other GI or GU symptoms.  Review of Systems  Chest pain, palpitations, tachycardia, exertional dyspnea, paroxysmal nocturnal dyspnea, claudication or edema are absent. No unexplained weight loss, abdominal pain, significant dyspepsia, dysphagia, melena, rectal bleeding, or persistently small caliber stools. Dysuria,  pyuria, hematuria, frequency, nocturia or polyuria are denied. Change in hair, skin, nails denied. No bowel changes of constipation or diarrhea. No intolerance to heat or cold.      Objective:   Physical Exam  Pertinent or positive findings include: She has a grade 1 systolic murmur at the right base. The upper thoracic spine curvature is accentuated. She has mild crepitus of the knees.  General appearance :adequately nourished; in no distress.  Eyes: No conjunctival inflammation or scleral icterus is present.  Oral exam:  Lips and gums are healthy appearing.There is no oropharyngeal erythema or exudate noted. Dental hygiene is good.  Heart:  Normal rate and regular rhythm. S1 and S2 normal without gallop, click, rub or other extra sounds    Lungs:Chest clear to auscultation; no wheezes, rhonchi,rales ,or rubs present.No increased work of breathing.   Abdomen: bowel sounds normal, soft and non-tender without masses, organomegaly or hernias noted.  No guarding or rebound. No flank tenderness to percussion.  Vascular : all pulses equal ; no bruits present.  Skin:Warm & dry.  Intact without suspicious lesions or rashes ; no tenting or jaundice   Lymphatic: No lymphadenopathy is noted about the head, neck, axilla.   Neuro: Strength, tone & DTRs normal.     Assessment & Plan:  #1 Medicare Wellness Exam; criteria met ; data entered #2 See Current Assessment & Plan in Problem List under specific Diagnosis. The labs will be reviewed and risks and options assessed.  Written recommendations will be provided by mail or directly through My Chart. Further evaluation or change in medical therapy will be directed by those results.

## 2015-03-10 NOTE — Assessment & Plan Note (Signed)
Lipids, LFTs, TSH  

## 2015-03-10 NOTE — Assessment & Plan Note (Signed)
CBC

## 2015-03-10 NOTE — Progress Notes (Signed)
Pre visit review using our clinic review tool, if applicable. No additional management support is needed unless otherwise documented below in the visit note. 

## 2015-03-10 NOTE — Assessment & Plan Note (Signed)
A1c

## 2015-03-10 NOTE — Assessment & Plan Note (Signed)
BMD 

## 2015-03-16 ENCOUNTER — Encounter: Payer: Self-pay | Admitting: Physician Assistant

## 2015-03-17 ENCOUNTER — Other Ambulatory Visit: Payer: Self-pay | Admitting: Emergency Medicine

## 2015-03-17 MED ORDER — RANITIDINE HCL 150 MG PO TABS: 150.0000 mg | ORAL_TABLET | Freq: Two times a day (BID) | ORAL | Status: DC

## 2015-03-21 ENCOUNTER — Other Ambulatory Visit (INDEPENDENT_AMBULATORY_CARE_PROVIDER_SITE_OTHER): Payer: Medicare Other

## 2015-03-21 DIAGNOSIS — E785 Hyperlipidemia, unspecified: Secondary | ICD-10-CM

## 2015-03-21 DIAGNOSIS — Z86010 Personal history of colonic polyps: Secondary | ICD-10-CM | POA: Diagnosis not present

## 2015-03-21 DIAGNOSIS — R7309 Other abnormal glucose: Secondary | ICD-10-CM

## 2015-03-21 LAB — LIPID PANEL
CHOLESTEROL: 194 mg/dL (ref 0–200)
HDL: 55.7 mg/dL (ref >39.00)
LDL Cholesterol: 125 mg/dL — ABNORMAL HIGH (ref 0–99)
NONHDL: 138.1
Total CHOL/HDL Ratio: 3
Triglycerides: 67 mg/dL (ref 0.0–149.0)
VLDL: 13.4 mg/dL (ref 0.0–40.0)

## 2015-03-21 LAB — BASIC METABOLIC PANEL
BUN: 15 mg/dL (ref 6–23)
CO2: 27 meq/L (ref 19–32)
Calcium: 9.3 mg/dL (ref 8.4–10.5)
Chloride: 108 mEq/L (ref 96–112)
Creatinine, Ser: 0.76 mg/dL (ref 0.40–1.20)
GFR: 80.51 mL/min (ref >60.00)
GLUCOSE: 96 mg/dL (ref 70–99)
POTASSIUM: 3.9 meq/L (ref 3.5–5.1)
SODIUM: 142 meq/L (ref 135–145)

## 2015-03-21 LAB — CBC WITH DIFFERENTIAL/PLATELET
Basophils Absolute: 0 10*3/uL (ref 0.0–0.1)
Basophils Relative: 0.4 % (ref 0.0–3.0)
EOS PCT: 2.1 % (ref 0.0–5.0)
Eosinophils Absolute: 0.1 10*3/uL (ref 0.0–0.7)
HEMATOCRIT: 41.8 % (ref 36.0–46.0)
Hemoglobin: 14.1 g/dL (ref 12.0–15.0)
LYMPHS ABS: 1.7 10*3/uL (ref 0.7–4.0)
LYMPHS PCT: 35.4 % (ref 12.0–46.0)
MCHC: 33.7 g/dL (ref 30.0–36.0)
MCV: 86.8 fl (ref 78.0–100.0)
MONOS PCT: 6.3 % (ref 3.0–12.0)
Monocytes Absolute: 0.3 10*3/uL (ref 0.1–1.0)
NEUTROS ABS: 2.7 10*3/uL (ref 1.4–7.7)
NEUTROS PCT: 55.8 % (ref 43.0–77.0)
Platelets: 239 10*3/uL (ref 150.0–400.0)
RBC: 4.81 Mil/uL (ref 3.87–5.11)
RDW: 13.3 % (ref 11.5–15.5)
WBC: 4.9 10*3/uL (ref 4.0–10.5)

## 2015-03-21 LAB — HEMOGLOBIN A1C: HEMOGLOBIN A1C: 5.3 % (ref 4.6–6.5)

## 2015-03-21 LAB — HEPATIC FUNCTION PANEL
ALBUMIN: 3.9 g/dL (ref 3.5–5.2)
ALT: 14 U/L (ref 0–35)
AST: 12 U/L (ref 0–37)
Alkaline Phosphatase: 78 U/L (ref 39–117)
Bilirubin, Direct: 0.1 mg/dL (ref 0.0–0.3)
TOTAL PROTEIN: 6.8 g/dL (ref 6.0–8.3)
Total Bilirubin: 0.6 mg/dL (ref 0.2–1.2)

## 2015-03-21 LAB — TSH: TSH: 2.5 u[IU]/mL (ref 0.35–4.50)

## 2015-05-08 ENCOUNTER — Ambulatory Visit (INDEPENDENT_AMBULATORY_CARE_PROVIDER_SITE_OTHER)
Admission: RE | Admit: 2015-05-08 | Discharge: 2015-05-08 | Disposition: A | Discharge status: Home / Self Care | Payer: Medicare Other | Source: Ambulatory Visit | Attending: Internal Medicine | Admitting: Internal Medicine

## 2015-05-08 DIAGNOSIS — Z780 Asymptomatic menopausal state: Secondary | ICD-10-CM | POA: Diagnosis not present

## 2015-06-08 ENCOUNTER — Ambulatory Visit (INDEPENDENT_AMBULATORY_CARE_PROVIDER_SITE_OTHER): Payer: Medicare Other | Admitting: Internal Medicine

## 2015-06-08 ENCOUNTER — Encounter: Payer: Self-pay | Admitting: Internal Medicine

## 2015-06-08 VITALS — BP 142/82 | HR 82 | Temp 98.3°F | Ht 63.0 in | Wt 178.0 lb

## 2015-06-08 DIAGNOSIS — J019 Acute sinusitis, unspecified: Secondary | ICD-10-CM

## 2015-06-08 DIAGNOSIS — I493 Ventricular premature depolarization: Secondary | ICD-10-CM

## 2015-06-08 DIAGNOSIS — Z23 Encounter for immunization: Secondary | ICD-10-CM

## 2015-06-08 DIAGNOSIS — J329 Chronic sinusitis, unspecified: Secondary | ICD-10-CM | POA: Insufficient documentation

## 2015-06-08 DIAGNOSIS — R7309 Other abnormal glucose: Secondary | ICD-10-CM

## 2015-06-08 MED ORDER — LEVOFLOXACIN 250 MG PO TABS: 250.0000 mg | ORAL_TABLET | Freq: Every day | ORAL | Status: DC

## 2015-06-08 NOTE — Patient Instructions (Addendum)
Please take all new medication as prescribed- the antibiotic  You had the Pneumovax shot today  You can also take Delsym OTC for cough, and/or Mucinex (or it's generic off brand) for congestion, and tylenol as needed for pain.  Please continue all other medications as before, and refills have been done if requested.  Please have the pharmacy call with any other refills you may need.  Please keep your appointments with your specialists as you may have planned

## 2015-06-08 NOTE — Progress Notes (Signed)
Subjective:    Patient ID: Toni [name], female    DOB: [birth year], 70 y.o.   MRN: [id number]  HPI   Here with 2-3 days acute onset fever, facial pain, pressure, headache, general weakness and malaise, and greenish d/c, with mild ST and cough, but pt denies chest pain, wheezing, increased sob or doe, orthopnea, PND, increased LE swelling, palpitations, dizziness or syncope.  Pt denies new neurological symptoms such as new headache, or facial or extremity weakness or numbness   Pt denies polydipsia, polyuria Past Medical History  Diagnosis Date  . Aortic sclerosis ([place]) 2000    on ECHO  . Stricture and stenosis of esophagus     dilation X 3  . Esophageal reflux   . Diverticulosis of colon (without mention of hemorrhage)   . Colon polyps     Dr Sharlett Iles  . Hyperlipidemia 1999    LDL 158, resolved with diet  . Eye abnormality     OD ocular freckle  . SVT (supraventricular tachycardia) ([facility])     slow pathway ablated  . Premature ventricular contractions   . H/O hiatal hernia    Past Surgical History  Procedure Laterality Date  . Ovarian wedge resection      to allow pregnancy  . Knee arthroscopy      bilateral  . Ankle surgery      right  . Tonsillectomy    . Colonoscopy w/ polypectomy  2011    Dr Sharlett Iles  . Supraventricular tachycardia ablation N/A 06/08/2013    Procedure: SUPRAVENTRICULAR TACHYCARDIA ABLATION;  Surgeon: Coralyn Mark, MD;  Location: [facility];  Service: Cardiovascular;  Laterality: N/A;  . Colonoscopy      reports that she has never smoked. She has never used smokeless tobacco. She reports that she does not drink alcohol or use illicit drugs. family history includes Arthritis in her mother; Diabetes in her maternal grandmother; Heart attack (age of onset: 76) in her sister; Hypertension in her mother; Hypertrophic cardiomyopathy in her sister; Hypothyroidism in her mother and sister; Osteopenia in her sister; Ovarian cancer in her sister; Stroke (age  of onset: 75) in her father; Stroke (age of onset: 87) in her mother. There is no history of Colon cancer, Esophageal cancer, Liver disease, Stomach cancer, or Rectal cancer. Allergies  Allergen Reactions  . Dexilant [Dexlansoprazole]     REACTION: pressure in ears  . Prevacid [Lansoprazole]     REACTION: pressure in ears  . Prilosec [Omeprazole]     REACTION: pressure in ears   Current Outpatient Prescriptions on File Prior to Visit  Medication Sig Dispense Refill  . aspirin 81 MG chewable tablet Chew 81 mg by mouth daily.    Marland Kitchen CALCIUM CITRATE PO Take 1 tablet by mouth daily.     . cholecalciferol (VITAMIN D) 1000 UNITS tablet Take 1,000 Units by mouth daily.      . Multiple Vitamin (MULTIVITAMIN WITH MINERALS) TABS Take 1 tablet by mouth daily.    . Omega-3 Fatty Acids (FISH OIL) 1000 MG CAPS Take 1,000 mg by mouth daily.     . ranitidine (ZANTAC) 150 MG tablet Take 1 tablet (150 mg total) by mouth 2 (two) times daily. 180 tablet 3  . VITAMIN E PO Take 1 tablet by mouth daily.     No current facility-administered medications on file prior to visit.    Review of Systems  Constitutional: Negative for unusual diaphoresis or night sweats HENT: Negative for ringing in ear or  discharge Eyes: Negative for double vision or worsening visual disturbance.  Respiratory: Negative for choking and stridor.   Gastrointestinal: Negative for vomiting or other signifcant bowel change Genitourinary: Negative for hematuria or change in urine volume.  Musculoskeletal: Negative for other MSK pain or swelling Skin: Negative for color change and worsening wound.  Neurological: Negative for tremors and numbness other than noted  Psychiatric/Behavioral: Negative for decreased concentration or agitation other than above       Objective:   Physical Exam BP 142/82 mmHg  Pulse 82  Temp(Src) 98.3 F (36.8 C) (Oral)  Ht 5\' 3"  (1.6 m)  Wt 178 lb (80.74 kg)  BMI 31.54 kg/m2  SpO2 97%  LMP 05/27/2006 VS  noted, ,mild ill Constitutional: Pt appears in no significant distress HENT: Head: NCAT.  Right Ear: External ear normal.  Left Ear: External ear normal.  Bilat tm's with mild erythema.  Max sinus areas mild tender.  Pharynx with mild erythema, no exudate Eyes: . Pupils are equal, round, and reactive to light. Conjunctivae and EOM are normal Neck: Normal range of motion. Neck supple.  Cardiovascular: Normal rate and regular rhythm.   Pulmonary/Chest: Effort normal and breath sounds without rales or wheezing.  Neurological: Pt is alert. Not confused , motor grossly intact Skin: Skin is warm. No rash, no LE edema Psychiatric: Pt behavior is normal. No agitation.      Assessment & Plan:

## 2015-06-08 NOTE — Assessment & Plan Note (Signed)
stable overall by history and exam, recent data reviewed with pt, and pt to continue medical treatment as before,  to f/u any worsening symptoms or concerns Lab Results  Component Value Date   HGBA1C 5.3 03/21/2015

## 2015-06-08 NOTE — Assessment & Plan Note (Signed)
Mild to mod, for antibx course,  to f/u any worsening symptoms or concerns 

## 2015-06-08 NOTE — Assessment & Plan Note (Signed)
stable overall by history and exam, and pt to continue medical treatment as before,  to f/u any worsening symptoms or concerns 

## 2015-06-08 NOTE — Progress Notes (Signed)
Pre visit review using our clinic review tool, if applicable. No additional management support is needed unless otherwise documented below in the visit note. 

## 2015-06-13 ENCOUNTER — Telehealth: Payer: Self-pay | Admitting: *Deleted

## 2015-06-13 MED ORDER — LEVOFLOXACIN 250 MG PO TABS: 250.0000 mg | ORAL_TABLET | Freq: Every day | ORAL | Status: DC

## 2015-06-13 NOTE — Telephone Encounter (Signed)
Left msg on triage starting saw md on last thurs for her sinus infection. Pt states she still not feeling all the way better. Leaving going out of the country on Wed wanting to see if she can get a refill on med to take with her...Johny Chess

## 2015-06-13 NOTE — Telephone Encounter (Signed)
Ok, refill done.

## 2015-06-13 NOTE — Telephone Encounter (Signed)
Notified pt md ok refill has been sent to [facility].../lmb

## 2015-08-10 ENCOUNTER — Other Ambulatory Visit: Payer: Self-pay

## 2015-08-10 DIAGNOSIS — Z1231 Encounter for screening mammogram for malignant neoplasm of breast: Secondary | ICD-10-CM

## 2015-08-16 ENCOUNTER — Ambulatory Visit
Admission: RE | Admit: 2015-08-16 | Discharge: 2015-08-16 | Disposition: A | Discharge status: Home / Self Care | Payer: Medicare Other | Source: Ambulatory Visit

## 2015-08-16 DIAGNOSIS — Z1231 Encounter for screening mammogram for malignant neoplasm of breast: Secondary | ICD-10-CM | POA: Diagnosis not present

## 2015-08-24 DIAGNOSIS — H3589 Other specified retinal disorders: Secondary | ICD-10-CM | POA: Diagnosis not present

## 2015-09-12 DIAGNOSIS — J019 Acute sinusitis, unspecified: Secondary | ICD-10-CM | POA: Diagnosis not present

## 2015-09-12 DIAGNOSIS — J029 Acute pharyngitis, unspecified: Secondary | ICD-10-CM | POA: Diagnosis not present

## 2015-12-13 ENCOUNTER — Encounter: Payer: Self-pay | Admitting: Internal Medicine

## 2015-12-26 ENCOUNTER — Encounter: Payer: Self-pay | Admitting: Internal Medicine

## 2016-01-16 ENCOUNTER — Ambulatory Visit (AMBULATORY_SURGERY_CENTER): Payer: Self-pay

## 2016-01-16 VITALS — Ht 63.5 in | Wt 178.6 lb

## 2016-01-16 DIAGNOSIS — Q409 Congenital malformation of upper alimentary tract, unspecified: Secondary | ICD-10-CM

## 2016-01-16 DIAGNOSIS — Q403 Congenital malformation of stomach, unspecified: Secondary | ICD-10-CM

## 2016-01-16 NOTE — Progress Notes (Signed)
Per pt, no allergies to soy or egg products.Pt not taking any weight loss meds or using  O2 at home. 

## 2016-01-19 ENCOUNTER — Other Ambulatory Visit: Payer: Self-pay

## 2016-02-12 ENCOUNTER — Telehealth: Payer: Self-pay | Admitting: General Practice

## 2016-02-12 NOTE — Telephone Encounter (Signed)
Pt is a medicare pt and would like to know if you would take her on as a pt.  Her husband sees you and they would like to have the same doctor

## 2016-02-13 ENCOUNTER — Ambulatory Visit (AMBULATORY_SURGERY_CENTER): Payer: Medicare Other | Admitting: Internal Medicine

## 2016-02-13 ENCOUNTER — Encounter: Payer: Self-pay | Admitting: Internal Medicine

## 2016-02-13 VITALS — BP 105/64 | HR 63 | Temp 97.0°F | Resp 10 | Ht 63.0 in | Wt 178.0 lb

## 2016-02-13 DIAGNOSIS — K219 Gastro-esophageal reflux disease without esophagitis: Secondary | ICD-10-CM | POA: Diagnosis not present

## 2016-02-13 DIAGNOSIS — K2950 Unspecified chronic gastritis without bleeding: Secondary | ICD-10-CM | POA: Diagnosis not present

## 2016-02-13 DIAGNOSIS — Q409 Congenital malformation of upper alimentary tract, unspecified: Secondary | ICD-10-CM

## 2016-02-13 DIAGNOSIS — Q403 Congenital malformation of stomach, unspecified: Secondary | ICD-10-CM

## 2016-02-13 DIAGNOSIS — K440 Diaphragmatic hernia with obstruction, without gangrene: Secondary | ICD-10-CM

## 2016-02-13 DIAGNOSIS — R0602 Shortness of breath: Secondary | ICD-10-CM | POA: Diagnosis not present

## 2016-02-13 DIAGNOSIS — R1310 Dysphagia, unspecified: Secondary | ICD-10-CM | POA: Diagnosis not present

## 2016-02-13 DIAGNOSIS — K209 Esophagitis, unspecified: Secondary | ICD-10-CM | POA: Diagnosis not present

## 2016-02-13 DIAGNOSIS — I471 Supraventricular tachycardia: Secondary | ICD-10-CM | POA: Diagnosis not present

## 2016-02-13 DIAGNOSIS — K2970 Gastritis, unspecified, without bleeding: Secondary | ICD-10-CM | POA: Diagnosis not present

## 2016-02-13 MED ORDER — SODIUM CHLORIDE 0.9 % IV SOLN: 500.0000 mL | INTRAVENOUS | Status: DC

## 2016-02-13 NOTE — Telephone Encounter (Signed)
Yes, ok 

## 2016-02-13 NOTE — Patient Instructions (Signed)
YOU HAD AN ENDOSCOPIC PROCEDURE TODAY AT [facility]:   Refer to the procedure report that was given to you for any specific questions about what was found during the examination.  If the procedure report does not answer your questions, please call your gastroenterologist to clarify.  If you requested that your care partner not be given the details of your procedure findings, then the procedure report has been included in a sealed envelope for you to review at your convenience later.  YOU SHOULD EXPECT: Some feelings of bloating in the abdomen. Passage of more gas than usual.  Walking can help get rid of the air that was put into your GI tract during the procedure and reduce the bloating. If you had a lower endoscopy (such as a colonoscopy or flexible sigmoidoscopy) you may notice spotting of blood in your stool or on the toilet paper. If you underwent a bowel prep for your procedure, you may not have a normal bowel movement for a few days.  Please Note:  You might notice some irritation and congestion in your nose or some drainage.  This is from the oxygen used during your procedure.  There is no need for concern and it should clear up in a day or so.  SYMPTOMS TO REPORT IMMEDIATELY:   Following lower endoscopy (colonoscopy or flexible sigmoidoscopy):  Excessive amounts of blood in the stool  Significant tenderness or worsening of abdominal pains  Swelling of the abdomen that is new, acute  Fever of 100F or higher   Following upper endoscopy (EGD)  Vomiting of blood or coffee ground material  New chest pain or pain under the shoulder blades  Painful or persistently difficult swallowing  New shortness of breath  Fever of 100F or higher  Black, tarry-looking stools  For urgent or emergent issues, a gastroenterologist can be reached at any hour by calling [phone].   DIET:  We do recommend a small meal at first, but then you may proceed to your regular diet.  Drink  plenty of fluids but you should avoid alcoholic beverages for 24 hours.  ACTIVITY:  You should plan to take it easy for the rest of today and you should NOT DRIVE or use heavy machinery until tomorrow (because of the sedation medicines used during the test).    FOLLOW UP: Our staff will call the number listed on your records the next business day following your procedure to check on you and address any questions or concerns that you may have regarding the information given to you following your procedure. If we do not reach you, we will leave a message.  However, if you are feeling well and you are not experiencing any problems, there is no need to return our call.  We will assume that you have returned to your regular daily activities without incident.  If any biopsies were taken you will be contacted by phone or by letter within the next 1-3 weeks.  Please call us at [phone] if you have not heard about the biopsies in 3 weeks.    SIGNATURES/CONFIDENTIALITY: You and/or your care partner have signed paperwork which will be entered into your electronic medical record.  These signatures attest to the fact that that the information above on your After Visit Summary has been reviewed and is understood.  Full responsibility of the confidentiality of this discharge information lies with you and/or your care-partner.  Esophagitis and hiatal hernia information given.

## 2016-02-13 NOTE — Op Note (Signed)
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 The patient's tolerance of                            previous anesthesia was also reviewed. The risks                            and benefits of the procedure and the sedation                            options and risks were discussed with the patient.                            All questions were answered, and informed consent                            was obtained. Prior Anticoagulants: The patient has                            taken no previous anticoagulant or antiplatelet                            agents. ASA Grade Assessment: II - A patient with                            mild systemic disease. After reviewing the risks                            and benefits, the patient was deemed in  satisfactory condition to undergo the procedure.                           After obtaining informed consent, the endoscope was                            passed under direct vision. Throughout the                            procedure, the patient's blood pressure, pulse, and                            oxygen saturations were monitored continuously. The                            Model GIF-HQ190  [id number]) scope was introduced                            through the mouth, and advanced to the second part                            of duodenum. The upper GI endoscopy was                            accomplished without difficulty. The patient                            tolerated the procedure well. Scope In: Scope Out: Findings:                 In the proximal esophagus there was a small inlet                            patch without nodularity or visible abnormality.                           The middle third of the esophagus and lower third                            of the esophagus were moderately tortuous.                           LA Grade A (one or more mucosal breaks less than 5                            mm, not extending between tops of 2 mucosal folds)                            esophagitis was found at the gastroesophageal                            junction.                           A 3 cm  hiatal hernia was present.                           Localized mucosal changes characterized by                            nodularity and flat polypoid change were found in                            the cardia. This area is immediately distal to the                            GE junction (gastric side of GEJ). Multiple                            biopsies were obtained with cold forceps for                            histology in a targeted manner at the                            gastroesophageal junction.                           The exam of the stomach was otherwise normal.                           The examined duodenum was normal. Complications:            No immediate complications. Estimated Blood Loss:     Estimated blood loss was minimal. Impression:               - Tortuous esophagus.                           - LA Grade A esophagitis.                           - 3 cm hiatal hernia.                           - Nodular mucosa in the cardia just distal to                             Z-line. Multiple biopsies.                           - Normal examined duodenum. Recommendation:           - Patient has a contact number available for                            emergencies. The signs and symptoms of potential                            delayed complications were discussed with the  patient. Return to normal activities tomorrow.                            Written discharge instructions were provided to the                            patient.                           - Resume previous diet.                           - Continue present medications.                           - Await pathology results. If dysplasia is present                            I recommend referral to Dr. Adria Devon at [facility] Jerene Bears, MD 02/13/2016 10:47:25 AM This report has been signed electronically.

## 2016-02-13 NOTE — Progress Notes (Signed)
Transported to recovery VSS Report to RN

## 2016-02-13 NOTE — Progress Notes (Signed)
Called to room to assist during endoscopic procedure.  Patient ID and intended procedure confirmed with present staff. Received instructions for my participation in the procedure from the performing physician.  

## 2016-02-14 ENCOUNTER — Telehealth: Payer: Self-pay | Admitting: *Deleted

## 2016-02-14 NOTE — Telephone Encounter (Signed)
  Follow up Call-  Call back number 02/13/2016 12/28/2014 10/14/2014  Post procedure Call Back phone  # [phone]  Permission to leave phone message Yes Yes Yes  Some recent data might be hidden     Patient questions:  Do you have a fever, pain , or abdominal swelling? No. Pain Score  0 *  Have you tolerated food without any problems? Yes.    Have you been able to return to your normal activities? Yes.    Do you have any questions about your discharge instructions: Diet   No. Medications  No. Follow up visit  No.  Do you have questions or concerns about your Care? No.  Actions: * If pain score is 4 or above: No action needed, pain <4.

## 2016-03-20 DIAGNOSIS — Z124 Encounter for screening for malignant neoplasm of cervix: Secondary | ICD-10-CM | POA: Diagnosis not present

## 2016-04-08 DIAGNOSIS — K449 Diaphragmatic hernia without obstruction or gangrene: Secondary | ICD-10-CM | POA: Diagnosis not present

## 2016-04-08 DIAGNOSIS — Q403 Congenital malformation of stomach, unspecified: Secondary | ICD-10-CM | POA: Diagnosis not present

## 2016-04-08 DIAGNOSIS — K222 Esophageal obstruction: Secondary | ICD-10-CM | POA: Diagnosis not present

## 2016-04-08 DIAGNOSIS — Z79899 Other long term (current) drug therapy: Secondary | ICD-10-CM | POA: Diagnosis not present

## 2016-04-08 DIAGNOSIS — K2970 Gastritis, unspecified, without bleeding: Secondary | ICD-10-CM | POA: Diagnosis not present

## 2016-04-08 DIAGNOSIS — Z888 Allergy status to other drugs, medicaments and biological substances status: Secondary | ICD-10-CM | POA: Diagnosis not present

## 2016-04-08 DIAGNOSIS — Z7982 Long term (current) use of aspirin: Secondary | ICD-10-CM | POA: Diagnosis not present

## 2016-04-08 DIAGNOSIS — K210 Gastro-esophageal reflux disease with esophagitis: Secondary | ICD-10-CM | POA: Diagnosis not present

## 2016-04-08 DIAGNOSIS — Z1159 Encounter for screening for other viral diseases: Secondary | ICD-10-CM | POA: Diagnosis not present

## 2016-04-08 DIAGNOSIS — K3189 Other diseases of stomach and duodenum: Secondary | ICD-10-CM | POA: Diagnosis not present

## 2016-05-29 DIAGNOSIS — M546 Pain in thoracic spine: Secondary | ICD-10-CM | POA: Diagnosis not present

## 2016-05-29 DIAGNOSIS — M5134 Other intervertebral disc degeneration, thoracic region: Secondary | ICD-10-CM | POA: Diagnosis not present

## 2016-06-13 ENCOUNTER — Ambulatory Visit: Payer: Medicare Other | Admitting: Physical Therapy

## 2016-06-14 ENCOUNTER — Encounter: Payer: Self-pay | Admitting: Physical Therapy

## 2016-06-14 ENCOUNTER — Ambulatory Visit: Payer: Medicare Other | Attending: Sports Medicine | Admitting: Physical Therapy

## 2016-06-14 DIAGNOSIS — M546 Pain in thoracic spine: Secondary | ICD-10-CM | POA: Diagnosis not present

## 2016-06-14 DIAGNOSIS — R252 Cramp and spasm: Secondary | ICD-10-CM | POA: Diagnosis not present

## 2016-06-14 NOTE — Therapy (Signed)
[address] [place], [place], [address] Phone: [phone]   Fax:  [phone]  Physical Therapy Evaluation  Patient Details  Name: Toni [name] MRN: [id number] Date of Birth: [birth year] Referring Provider: Delilah Shan  Encounter Date: 06/14/2016      PT End of Session - 06/14/16 1004    Visit Number 1   PT Start Time 0933   PT Stop Time 1020   PT Time Calculation (min) 47 min   Activity Tolerance Patient tolerated treatment well   Behavior During Therapy [facility] for tasks assessed/performed      Past Medical History:  Diagnosis Date  . Aortic sclerosis 2000   on ECHO  . Colon polyps    Dr Sharlett Iles  . Diverticulosis of colon (without mention of hemorrhage)   . Esophageal reflux   . Eye abnormality    OD ocular freckle  . H/O hiatal hernia   . Hyperlipidemia 1999   LDL 158, resolved with diet  . Premature ventricular contractions   . Stricture and stenosis of esophagus    dilation X 3  . SVT (supraventricular tachycardia) ([facility])    slow pathway ablated    Past Surgical History:  Procedure Laterality Date  . ANKLE SURGERY     right  . COLONOSCOPY    . COLONOSCOPY W/ POLYPECTOMY  2011   Dr Sharlett Iles  . KNEE ARTHROSCOPY     bilateral  . ovarian wedge resection     to allow pregnancy  . SUPRAVENTRICULAR TACHYCARDIA ABLATION N/A 06/08/2013   Procedure: SUPRAVENTRICULAR TACHYCARDIA ABLATION;  Surgeon: Coralyn Mark, MD;  Location: [facility] CATH LAB;  Service: Cardiovascular;  Laterality: N/A;  . TONSILLECTOMY      There were no vitals filed for this visit.       Subjective Assessment - 06/14/16 0940    Subjective Patient reports a right thoracic back pain, she reports that about a year ago she had this after holding her grandson.  She reports that tis mostly resolved, reports that recently the pain returned after raking leaves.  X-rays show mild arthritis.   Limitations Lifting   Patient  Stated Goals have less pain   Currently in Pain? Yes   Pain Score 3    Pain Location Thoracic   Pain Orientation Right;Mid   Pain Descriptors / Indicators Aching;Stabbing   Pain Type Acute pain   Pain Onset More than a month ago   Pain Frequency Intermittent   Aggravating Factors  lifting grand kids Pain up to 8/10   Pain Relieving Factors not lifting , using heat and Aleve   Effect of Pain on Daily Activities can't lift            [facility] PT Assessment - 06/14/16 0001      Assessment   Medical Diagnosis thoracic pain   Referring Provider Delilah Shan   Onset Date/Surgical Date 05/31/16   Prior Therapy no     Precautions   Precautions None     Balance Screen   Has the patient fallen in the past 6 months No   Has the patient had a decrease in activity level because of a fear of falling?  No   Is the patient reluctant to leave their home because of a fear of falling?  No     Home Environment   Additional Comments does housework and yardwork     Prior Function   Level of Independence Independent   Vocation Retired  Leisure walks for exercise     Posture/Postural Control   Posture Comments leans away from the right side     ROM / Strength   AROM / PROM / Strength AROM;Strength     AROM   Overall AROM Comments Lumbar ROM WNL's for flexion, decreased 50% for extension and side bending with pain in the right rib area     Strength   Overall Strength Comments 4/5 for the arms without increase of pain     Palpation   Palpation comment very tight with spasms in the rhomboids and the teres as well as the upper traps                   [facility] Adult PT Treatment/Exercise - 06/14/16 0001      Modalities   Modalities Moist Heat;Electrical Stimulation     Moist Heat Therapy   Number Minutes Moist Heat 15 Minutes   Moist Heat Location Lumbar Spine     Electrical Stimulation   Electrical Stimulation Location right thoracic area   Electrical Stimulation Action IFC    Electrical Stimulation Parameters supine   Electrical Stimulation Goals Pain                PT Education - 06/14/16 1004    Education provided Yes   Education Details went over safe gym exercises and forms, also on Tband exercises   Person(s) Educated Patient;Spouse   Methods Explanation;Demonstration   Comprehension Verbalized understanding;Returned demonstration          PT Short Term Goals - 06/14/16 1006      PT SHORT TERM GOAL #1   Title independent with initial HEP   Time 1   Period Days   Status Achieved                  Plan - 06/14/16 1005    Rehab Potential Good   PT Frequency One time visit   PT Treatment/Interventions ADLs/Self Care Home Management;Electrical Stimulation;Moist Heat;Therapeutic activities;Therapeutic exercise   PT Next Visit Plan Patient will be leaving for 6 weeks so one visit only   Consulted and Agree with Plan of Care Patient      Patient will benefit from skilled therapeutic intervention in order to improve the following deficits and impairments:  Decreased mobility, Decreased range of motion, Pain, Increased muscle spasms, Postural dysfunction, Impaired flexibility  Visit Diagnosis: Pain in thoracic spine - Plan: PT plan of care cert/re-cert  Cramp and spasm - Plan: PT plan of care cert/re-cert      G-Codes - 0000000 1006    Functional Assessment Tool Used PT discretion   Functional Limitation Other PT primary   Other PT Primary Current Status [id number]) At least 20 percent but less than 40 percent impaired, limited or restricted   Other PT Primary Goal Status [id number]) At least 20 percent but less than 40 percent impaired, limited or restricted   Other PT Primary Discharge Status [id number]) At least 20 percent but less than 40 percent impaired, limited or restricted       Problem List Patient Active Problem List   Diagnosis Date Noted  . Acute sinus infection 06/08/2015  . Postmenopausal estrogen deficiency  03/10/2015  . Other abnormal glucose 02/10/2014  . SVT (supraventricular tachycardia) ([place]) 06/08/2013  . Shortness of breath 04/05/2013  . PVC's (premature ventricular contractions) 07/28/2011  . Hyperlipidemia 04/12/2010  . AORTIC SCLEROSIS 04/12/2010  . COLONIC POLYPS, HX OF 04/12/2010  . ESOPHAGEAL STRICTURE 05/29/2009  .  GERD 05/29/2009  . DIVERTICULOSIS, COLON 05/29/2009    Sumner Boast., PT 06/14/2016, 10:08 AM  [facility] [place], [place], [address] Phone: [phone]   Fax:  [phone]  Name: Toni [name] MRN: [id number] Date of Birth: 1945/11/16

## 2016-08-02 DIAGNOSIS — J029 Acute pharyngitis, unspecified: Secondary | ICD-10-CM | POA: Diagnosis not present

## 2016-08-14 DIAGNOSIS — J019 Acute sinusitis, unspecified: Secondary | ICD-10-CM | POA: Diagnosis not present

## 2016-08-14 DIAGNOSIS — R05 Cough: Secondary | ICD-10-CM | POA: Diagnosis not present

## 2016-08-15 ENCOUNTER — Other Ambulatory Visit: Payer: Self-pay | Admitting: Obstetrics & Gynecology

## 2016-08-15 DIAGNOSIS — Z1231 Encounter for screening mammogram for malignant neoplasm of breast: Secondary | ICD-10-CM

## 2016-08-28 DIAGNOSIS — D3131 Benign neoplasm of right choroid: Secondary | ICD-10-CM | POA: Diagnosis not present

## 2016-08-28 DIAGNOSIS — H3540 Unspecified peripheral retinal degeneration: Secondary | ICD-10-CM | POA: Diagnosis not present

## 2016-09-04 ENCOUNTER — Ambulatory Visit
Admission: RE | Admit: 2016-09-04 | Discharge: 2016-09-04 | Disposition: A | Discharge status: Home / Self Care | Payer: Medicare Other | Source: Ambulatory Visit | Attending: Obstetrics & Gynecology | Admitting: Obstetrics & Gynecology

## 2016-09-04 DIAGNOSIS — Z1231 Encounter for screening mammogram for malignant neoplasm of breast: Secondary | ICD-10-CM

## 2016-09-23 ENCOUNTER — Encounter: Payer: Self-pay | Admitting: Internal Medicine

## 2016-09-23 DIAGNOSIS — M8580 Other specified disorders of bone density and structure, unspecified site: Secondary | ICD-10-CM | POA: Insufficient documentation

## 2016-09-23 DIAGNOSIS — M810 Age-related osteoporosis without current pathological fracture: Secondary | ICD-10-CM | POA: Insufficient documentation

## 2016-09-23 NOTE — Progress Notes (Signed)
Subjective:    Patient ID: Toni [name], female    DOB: [birth year], 71 y.o.   MRN: [id number]  HPI  She is here to establish with a new pcp.    Here for medicare wellness exam and follow up of her chronic medical problems.   I have personally reviewed and have noted 1.The patient's medical and social history 2.Their use of alcohol, tobacco or illicit drugs 3.Their current medications and supplements 4.The patient's functional ability including ADL's, fall risks, home safety risks and hearing or visual impairment. 5.Diet and physical activities 6.Evidence for depression or mood disorders 7.Care team reviewed  -  Gyn - Dr Dellis Filbert, GI - Dr Hilarie Fredrickson, Cardio - Dr Rayann Heman  Osteopenia:  Her last dexa was 2016.  She is not taking calcium and vitamin d daily.  She is exercising regularly.    GERD:  She will be having a EGD next week.  She is taking her medication daily as prescribed.  She denies any GERD symptoms and feels her GERD is well controlled.    Are there smokers in your home (other than you)? No  Risk Factors Exercise: walks for exercise Dietary issues discussed: not eating as healthy   - working on improving diet - husband has diabetes  Cardiac risk factors: advanced age, obesity (BMI >= 30 kg/m2).  Depression Screen  Have you felt down, depressed or hopeless? No  Have you felt little interest or pleasure in doing things?  No  Activities of Daily Living In your present state of health, do you have any difficulty performing the following activities?:  Driving? No Managing money?  No Feeding yourself? No Getting from bed to chair? No Climbing a flight of stairs? Yes - knee pain Preparing food and eating?: No Bathing or showering? No Getting dressed: No Getting to/using the toilet? No Moving around from place to place: No In the past year have you fallen or had a near fall?: No   Are you sexually  active?  yes  Do you have more than one partner?  No   Hearing Difficulties: No Do you often ask people to speak up or repeat themselves? No Do you experience ringing or noises in your ears? No Do you have difficulty understanding soft or whispered voices? No Vision:              Any change in vision:  no             Up to date with eye exam:  yes  Memory:  Do you feel that you have a problem with memory? No  Do you often misplace items? No  Do you feel safe at home?  Yes  Cognitive Testing  Alert, Orientated? Yes  Normal Appearance? Yes  Recall of three objects?  Yes  Can perform simple calculations? Yes  Displays appropriate judgment? Yes  Can read the correct time from a watch face? Yes   Advanced Directives have been discussed with the patient? Yes - in place   Medications and allergies reviewed with patient and updated if appropriate.  Patient Active Problem List   Diagnosis Date Noted  . Osteopenia 09/23/2016  . 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 Postmenopausal estrogen deficiency 03/10/2015  . Other abnormal glucose 02/10/2014  . SVT (supraventricular tachycardia) ([place]) 06/08/2013  . PVC's (premature ventricular contractions) 07/28/2011  . Hyperlipidemia 04/12/2010  . AORTIC SCLEROSIS 04/12/2010  . COLONIC POLYPS, HX OF 04/12/2010  . ESOPHAGEAL STRICTURE 05/29/2009  . GERD 05/29/2009  .  DIVERTICULOSIS, COLON 05/29/2009    Current Outpatient Prescriptions on File Prior to Visit  Medication Sig Dispense Refill  . VITAMIN E PO Take 1 tablet by mouth daily.    Marland Kitchen CALCIUM CITRATE PO Take 1 tablet by mouth daily.     . cholecalciferol (VITAMIN D) 1000 UNITS tablet Take 1,000 Units by mouth daily.      . Multiple Vitamin (MULTIVITAMIN WITH MINERALS) TABS Take 1 tablet by mouth daily.    . Omega-3 Fatty Acids (FISH OIL) 1000 MG CAPS Take 1,000 mg by mouth daily.      No current facility-administered medications on file prior to visit.     Past Medical History:    Diagnosis Date  . Aortic sclerosis 2000   on ECHO  . Colon polyps    Dr Sharlett Iles  . Diverticulosis of colon (without mention of hemorrhage)   . Esophageal reflux   . Eye abnormality    OD ocular freckle  . H/O hiatal hernia   . Hyperlipidemia 1999   LDL 158, resolved with diet  . Premature ventricular contractions   . Stricture and stenosis of esophagus    dilation X 3  . SVT (supraventricular tachycardia) ([facility])    slow pathway ablated    Past Surgical History:  Procedure Laterality Date  . ANKLE SURGERY     right  . COLONOSCOPY    . COLONOSCOPY W/ POLYPECTOMY  2011   Dr Sharlett Iles  . KNEE ARTHROSCOPY     bilateral  . ovarian wedge resection     to allow pregnancy  . SUPRAVENTRICULAR TACHYCARDIA ABLATION N/A 06/08/2013   Procedure: SUPRAVENTRICULAR TACHYCARDIA ABLATION;  Surgeon: Coralyn Mark, MD;  Location: [facility] CATH LAB;  Service: Cardiovascular;  Laterality: N/A;  . TONSILLECTOMY      Social History   Social History  . Marital status: Married    Spouse name: N/A  . Number of children: 2  . Years of education: N/A   Occupational History  . Network engineer   .  [employer]   Social History Main Topics  . Smoking status: Never Smoker  . Smokeless tobacco: Never Used  . Alcohol use No     Comment: socially, once / month  . Drug use: No  . Sexual activity: Yes    Birth control/ protection: Post-menopausal   Other Topics Concern  . None   Social History Narrative  . None    Family History  Problem Relation Age of Onset  . Stroke Father 64  . Arthritis Mother     OA  . Hypertension Mother   . Hypothyroidism Mother   . Stroke Mother 95    ischemic  . Heart attack Sister 24    smoker  . Ovarian cancer Sister   . Osteopenia Sister   . Hypertrophic cardiomyopathy Sister   . Hypothyroidism Sister   . Diabetes Maternal Grandmother   . Colon cancer Neg Hx   . Esophageal cancer Neg Hx   . Liver disease Neg Hx   . Stomach cancer Neg Hx   . Rectal  cancer Neg Hx     Review of Systems  Constitutional: Negative for appetite change, chills, fatigue and fever.  HENT: Negative for hearing loss and tinnitus.   Eyes: Negative for visual disturbance.  Respiratory: Negative for cough, shortness of breath and wheezing.   Cardiovascular: Negative for chest pain, palpitations and leg swelling.  Gastrointestinal: Negative for abdominal pain, blood in stool, constipation, diarrhea and nausea.  Genitourinary: Negative  for dysuria and hematuria.  Musculoskeletal: Positive for arthralgias (knees with stairs or kneeling; mild hand arthritis). Negative for back pain.  Skin: Negative for color change and rash.  Neurological: Negative for light-headedness and headaches.  Psychiatric/Behavioral: Negative for dysphoric mood. The patient is not nervous/anxious.        Objective:   Vitals:   09/25/16 0907  BP: 122/86  Pulse: 74  Resp: 16  Temp: 98.4 F (36.9 C)   Filed Weights   09/25/16 0907  Weight: 185 lb (83.9 kg)   Body mass index is 32.77 kg/m.  Wt Readings from Last 3 Encounters:  09/25/16 185 lb (83.9 kg)  02/13/16 178 lb (80.7 kg)  01/16/16 178 lb 9.6 oz (81 kg)     Physical Exam Constitutional: She appears well-developed and well-nourished. No distress.  HENT:  Head: Normocephalic and atraumatic.  Right Ear: External ear normal. Normal ear canal and TM Left Ear: External ear normal.  Normal ear canal and TM Mouth/Throat: Oropharynx is clear and moist.  Eyes: Conjunctivae and EOM are normal.  Neck: Neck supple. No tracheal deviation present. No thyromegaly present.  No carotid bruit  Cardiovascular: Normal rate, regular rhythm and normal heart sounds.   No murmur heard.  No edema. Pulmonary/Chest: Effort normal and breath sounds normal. No respiratory distress. She has no wheezes. She has no rales.  Breast: deferred to Gyn Abdominal: Soft. She exhibits no distension. There is no tenderness.  Lymphadenopathy: She has no  cervical adenopathy.  Skin: Skin is warm and dry. She is not diaphoretic.  Psychiatric: She has a normal mood and affect. Her behavior is normal.         Assessment & Plan:   Wellness Exam: Immunizations   Up to date, discussed shingrix Colonoscopy  Up to date  Mammogram   Up to date  Dexa  Done 2016 - osteopenia - due 2019 Gyn   Up to date  Eye exam   Up to date  Hearing loss  none Memory concerns/difficulties  none Independent of ADLs  fully Stressed the importance of regular exercise   Patient received copy of preventative screening tests/immunizations recommended for the next 5-10 years.  See Problem List for Assessment and Plan of chronic medical problems.

## 2016-09-23 NOTE — Patient Instructions (Addendum)
  [name] , Thank you for taking time to come for your Medicare Wellness Visit. I appreciate your ongoing commitment to your health goals. Please review the following plan we discussed and let me know if I can assist you in the future.   These are the goals we discussed: Goals    Continue regular exercise      This is a list of the screening recommended for you and due dates:  Health Maintenance  Topic Date Due  . Flu Shot  12/25/2016  . DEXA scan (bone density measurement)  05/07/2018  . Mammogram  09/05/2018  . Colon Cancer Screening  10/14/2019  . Tetanus Vaccine  02/25/2021  .  Hepatitis C: One time screening is recommended by Center for Disease Control  (CDC) for  adults born from [birth year] through 1963.   Completed  . Pneumonia vaccines  Completed     Test(s) ordered today. Your results will be released to [facility] (or called to you) after review, usually within 72hours after test completion. If any changes need to be made, you will be notified at that same time.  All other Health Maintenance issues reviewed.   All recommended immunizations and age-appropriate screenings are up-to-date or discussed.  No immunizations administered today.   Medications reviewed and updated.  Changes include restarting calcium and vitamin d -- try to take calcium citrate and take the opposite time of day that the pantoprazole.    Please followup in one year

## 2016-09-25 ENCOUNTER — Other Ambulatory Visit (INDEPENDENT_AMBULATORY_CARE_PROVIDER_SITE_OTHER): Payer: Medicare Other

## 2016-09-25 ENCOUNTER — Ambulatory Visit (INDEPENDENT_AMBULATORY_CARE_PROVIDER_SITE_OTHER): Payer: Medicare Other | Admitting: Internal Medicine

## 2016-09-25 ENCOUNTER — Encounter: Payer: Self-pay | Admitting: Internal Medicine

## 2016-09-25 VITALS — BP 122/86 | HR 74 | Temp 98.4°F | Resp 16 | Ht 63.0 in | Wt 185.0 lb

## 2016-09-25 DIAGNOSIS — K2100 Gastro-esophageal reflux disease with esophagitis, without bleeding: Secondary | ICD-10-CM

## 2016-09-25 DIAGNOSIS — I471 Supraventricular tachycardia, unspecified: Secondary | ICD-10-CM

## 2016-09-25 DIAGNOSIS — E7800 Pure hypercholesterolemia, unspecified: Secondary | ICD-10-CM

## 2016-09-25 DIAGNOSIS — M85852 Other specified disorders of bone density and structure, left thigh: Secondary | ICD-10-CM | POA: Diagnosis not present

## 2016-09-25 DIAGNOSIS — Z0000 Encounter for general adult medical examination without abnormal findings: Secondary | ICD-10-CM | POA: Diagnosis not present

## 2016-09-25 DIAGNOSIS — R7309 Other abnormal glucose: Secondary | ICD-10-CM

## 2016-09-25 DIAGNOSIS — M8580 Other specified disorders of bone density and structure, unspecified site: Secondary | ICD-10-CM | POA: Diagnosis not present

## 2016-09-25 DIAGNOSIS — E785 Hyperlipidemia, unspecified: Secondary | ICD-10-CM | POA: Diagnosis not present

## 2016-09-25 LAB — CBC WITH DIFFERENTIAL/PLATELET
BASOS PCT: 0.6 % (ref 0.0–3.0)
Basophils Absolute: 0 10*3/uL (ref 0.0–0.1)
Eosinophils Absolute: 0.1 10*3/uL (ref 0.0–0.7)
Eosinophils Relative: 2.4 % (ref 0.0–5.0)
HEMATOCRIT: 41.3 % (ref 36.0–46.0)
HEMOGLOBIN: 13.8 g/dL (ref 12.0–15.0)
LYMPHS ABS: 2.1 10*3/uL (ref 0.7–4.0)
Lymphocytes Relative: 35.4 % (ref 12.0–46.0)
MCHC: 33.4 g/dL (ref 30.0–36.0)
MCV: 86.7 fl (ref 78.0–100.0)
MONO ABS: 0.4 10*3/uL (ref 0.1–1.0)
MONOS PCT: 6.8 % (ref 3.0–12.0)
NEUTROS ABS: 3.3 10*3/uL (ref 1.4–7.7)
NEUTROS PCT: 54.8 % (ref 43.0–77.0)
Platelets: 243 10*3/uL (ref 150.0–400.0)
RBC: 4.76 Mil/uL (ref 3.87–5.11)
RDW: 13.8 % (ref 11.5–15.5)
WBC: 6 10*3/uL (ref 4.0–10.5)

## 2016-09-25 LAB — COMPREHENSIVE METABOLIC PANEL
ALBUMIN: 4.1 g/dL (ref 3.5–5.2)
ALK PHOS: 78 U/L (ref 39–117)
ALT: 22 U/L (ref 0–35)
AST: 17 U/L (ref 0–37)
BILIRUBIN TOTAL: 0.8 mg/dL (ref 0.2–1.2)
BUN: 14 mg/dL (ref 6–23)
CALCIUM: 9.4 mg/dL (ref 8.4–10.5)
CO2: 29 mEq/L (ref 19–32)
Chloride: 108 mEq/L (ref 96–112)
Creatinine, Ser: 0.73 mg/dL (ref 0.40–1.20)
GFR: 83.96 mL/min (ref >60.00)
Glucose, Bld: 95 mg/dL (ref 70–99)
POTASSIUM: 4.2 meq/L (ref 3.5–5.1)
Sodium: 142 mEq/L (ref 135–145)
TOTAL PROTEIN: 7.1 g/dL (ref 6.0–8.3)

## 2016-09-25 LAB — LIPID PANEL
CHOLESTEROL: 199 mg/dL (ref 0–200)
HDL: 65 mg/dL (ref >39.00)
LDL Cholesterol: 119 mg/dL — ABNORMAL HIGH (ref 0–99)
NonHDL: 134.29
Total CHOL/HDL Ratio: 3
Triglycerides: 76 mg/dL (ref 0.0–149.0)
VLDL: 15.2 mg/dL (ref 0.0–40.0)

## 2016-09-25 LAB — HEMOGLOBIN A1C: Hgb A1c MFr Bld: 5.5 % (ref 4.6–6.5)

## 2016-09-25 LAB — TSH: TSH: 1.76 u[IU]/mL (ref 0.35–4.50)

## 2016-09-25 NOTE — Assessment & Plan Note (Signed)
Continue exercise Improve diet  Check cmp, a1c

## 2016-09-25 NOTE — Assessment & Plan Note (Signed)
dexa up to date Advised to restart calcium/vitamin d Continue regular walking

## 2016-09-25 NOTE — Progress Notes (Signed)
Pre visit review using our clinic review tool, if applicable. No additional management support is needed unless otherwise documented below in the visit note. 

## 2016-09-25 NOTE — Assessment & Plan Note (Signed)
No symptoms since ablation.

## 2016-09-25 NOTE — Assessment & Plan Note (Signed)
GERD controlled Continue daily medication  

## 2016-09-25 NOTE — Assessment & Plan Note (Signed)
Check lipid, tsh, cmp

## 2016-09-28 ENCOUNTER — Encounter: Payer: Self-pay | Admitting: Internal Medicine

## 2016-10-01 DIAGNOSIS — K3189 Other diseases of stomach and duodenum: Secondary | ICD-10-CM | POA: Diagnosis not present

## 2016-10-01 DIAGNOSIS — K219 Gastro-esophageal reflux disease without esophagitis: Secondary | ICD-10-CM | POA: Diagnosis not present

## 2016-10-01 DIAGNOSIS — K449 Diaphragmatic hernia without obstruction or gangrene: Secondary | ICD-10-CM | POA: Diagnosis not present

## 2016-10-01 DIAGNOSIS — Q403 Congenital malformation of stomach, unspecified: Secondary | ICD-10-CM | POA: Diagnosis not present

## 2016-12-30 ENCOUNTER — Telehealth: Payer: Self-pay | Admitting: Internal Medicine

## 2016-12-30 DIAGNOSIS — N390 Urinary tract infection, site not specified: Secondary | ICD-10-CM | POA: Diagnosis not present

## 2016-12-30 NOTE — Telephone Encounter (Signed)
Left message for pt to call back  °

## 2016-12-31 ENCOUNTER — Encounter: Payer: Self-pay | Admitting: Internal Medicine

## 2016-12-31 DIAGNOSIS — K219 Gastro-esophageal reflux disease without esophagitis: Secondary | ICD-10-CM | POA: Diagnosis not present

## 2016-12-31 DIAGNOSIS — K3189 Other diseases of stomach and duodenum: Secondary | ICD-10-CM | POA: Diagnosis not present

## 2016-12-31 DIAGNOSIS — K222 Esophageal obstruction: Secondary | ICD-10-CM | POA: Diagnosis not present

## 2016-12-31 DIAGNOSIS — K449 Diaphragmatic hernia without obstruction or gangrene: Secondary | ICD-10-CM | POA: Diagnosis not present

## 2016-12-31 NOTE — Telephone Encounter (Signed)
Pt states she went to an urgent care yesterday and was seen for a uti, states she does not need an appt right now.

## 2017-01-08 ENCOUNTER — Telehealth: Payer: Self-pay | Admitting: Internal Medicine

## 2017-01-08 NOTE — Telephone Encounter (Signed)
Pt states she had a UTI and thought that was causing her to have abdominal discomfort and cramping. States she is still having the cramping and is afraid it may be a flare of diverticulitis. She is going out of town on Monday and would like to be seen. Pt scheduled to see Amy Esterwood PA 01/10/17@2pm . Pt aware of appt.

## 2017-01-10 ENCOUNTER — Ambulatory Visit (INDEPENDENT_AMBULATORY_CARE_PROVIDER_SITE_OTHER): Payer: Medicare Other | Admitting: Physician Assistant

## 2017-01-10 ENCOUNTER — Other Ambulatory Visit (INDEPENDENT_AMBULATORY_CARE_PROVIDER_SITE_OTHER): Payer: Medicare Other

## 2017-01-10 ENCOUNTER — Encounter: Payer: Self-pay | Admitting: Physician Assistant

## 2017-01-10 VITALS — BP 128/64 | HR 78 | Ht 63.0 in | Wt 179.0 lb

## 2017-01-10 DIAGNOSIS — R300 Dysuria: Secondary | ICD-10-CM

## 2017-01-10 DIAGNOSIS — R1032 Left lower quadrant pain: Secondary | ICD-10-CM | POA: Diagnosis not present

## 2017-01-10 LAB — BASIC METABOLIC PANEL
BUN: 16 mg/dL (ref 6–23)
CALCIUM: 9 mg/dL (ref 8.4–10.5)
CO2: 30 mEq/L (ref 19–32)
CREATININE: 0.83 mg/dL (ref 0.40–1.20)
Chloride: 108 mEq/L (ref 96–112)
GFR: 72.34 mL/min (ref >60.00)
GLUCOSE: 113 mg/dL — AB (ref 70–99)
Potassium: 4 mEq/L (ref 3.5–5.1)
SODIUM: 144 meq/L (ref 135–145)

## 2017-01-10 LAB — CBC WITH DIFFERENTIAL/PLATELET
BASOS PCT: 0.4 % (ref 0.0–3.0)
Basophils Absolute: 0 10*3/uL (ref 0.0–0.1)
EOS PCT: 0.7 % (ref 0.0–5.0)
Eosinophils Absolute: 0 10*3/uL (ref 0.0–0.7)
HCT: 39.8 % (ref 36.0–46.0)
HEMOGLOBIN: 13.3 g/dL (ref 12.0–15.0)
LYMPHS ABS: 2.2 10*3/uL (ref 0.7–4.0)
Lymphocytes Relative: 32.4 % (ref 12.0–46.0)
MCHC: 33.5 g/dL (ref 30.0–36.0)
MCV: 86.8 fl (ref 78.0–100.0)
MONO ABS: 0.3 10*3/uL (ref 0.1–1.0)
Monocytes Relative: 4.1 % (ref 3.0–12.0)
Neutro Abs: 4.3 10*3/uL (ref 1.4–7.7)
Neutrophils Relative %: 62.4 % (ref 43.0–77.0)
Platelets: 233 10*3/uL (ref 150.0–400.0)
RBC: 4.58 Mil/uL (ref 3.87–5.11)
RDW: 13.4 % (ref 11.5–15.5)
WBC: 6.9 10*3/uL (ref 4.0–10.5)

## 2017-01-10 MED ORDER — DICYCLOMINE HCL 10 MG PO CAPS: ORAL_CAPSULE | ORAL | 2 refills | Status: DC

## 2017-01-10 NOTE — Patient Instructions (Signed)
Please go to the basement level to have your labs drawn.  We have sent the following medications to your pharmacy for you to pick up at your convenience: 1. Bentyl ( dicyclomine) 10 mg.   You have been scheduled for a CT scan of the abdomen and pelvis at [facility] ([address]---this is in the same building as [facility]).   You are scheduled on Monday 01-13-2017 at 3:30 PM. You should arrive at 3:15 PM to your appointment time for registration. Please follow the written instructions below on the day of your exam:  WARNING: IF YOU ARE ALLERGIC TO IODINE/X-RAY DYE, PLEASE NOTIFY RADIOLOGY IMMEDIATELY AT [phone]! YOU WILL BE GIVEN A 13 HOUR PREMEDICATION PREP.  1) Do not eat or drink anything after 11:30 am (4 hours prior to your test) 2) You have been given 2 bottles of oral contrast to drink. The solution may taste  better if refrigerated, but do NOT add ice or any other liquid to this solution. Shake well before drinking.    Drink 1 bottle of contrast @ 1:30 PM (2 hours prior to your exam)  Drink 1 bottle of contrast @ 2:30 PM (1 hour prior to your exam)  You may take any medications as prescribed with a small amount of water except for the following: Metformin, Glucophage, Glucovance, Avandamet, Riomet, Fortamet, Actoplus Met, Janumet, Glumetza or Metaglip. The above medications must be held the day of the exam AND 48 hours after the exam.  The purpose of you drinking the oral contrast is to aid in the visualization of your intestinal tract. The contrast solution may cause some diarrhea. Before your exam is started, you will be given a small amount of fluid to drink. Depending on your individual set of symptoms, you may also receive an intravenous injection of x-ray contrast/dye. Plan on being at [facility] for 30 minutes or long, depending on the type of exam you are having performed.  If you have any questions regarding your exam or if you need to  reschedule, you may call the CT department at [phone] between the hours of 8:00 am and 5:00 pm, Monday-Friday.  ________________________________________________________________________

## 2017-01-10 NOTE — Progress Notes (Addendum)
Subjective:    Patient ID: Toni [name], female    DOB: 1946/05/22, 71 y.o.   MRN: [id number]  HPI  Toni [name] is a pleasant 71 year old white female, known to Dr. Hilarie Fredrickson. She comes in today with concerns about possible diverticulitis. She has history of chronic GERD, and Barrett's esophagus with low-grade dysplasia. She is currently being followed by Dr. Adria Devon at [facility] and had EGD on 12/31/2016 with finding of nodular mucosa in the gastric cardia which was treated with APC. She will have a follow-up EGD with him in 2 months. Last colonoscopy was done in May 2016 with finding of 2 small sessile polyps 3-5 mm and mild diverticulosis in the sigmoid colon. Path consistent with tubular adenomas. Patient says over the past couple of months she has been having intermittent discomfort in her left lower abdomen which she has been noticing particularly at night if her bladder is full and if she lays on her left side. She says if she empties her bladder symptoms seem to improve. Over the past week or so she has also been noticing some cramping in the left lower quadrant. She started taking some Bentyl from an old prescription and that helped. She's not had any fever or chills, no changes in her bowel habits. She also developed a urinary tract infection about 2 weeks ago which she has not had before and was treated with a course of nitrofurantoin. Her dysuria has resolved. She is still having the symptoms of discomfort with laying on her left side and discomfort in the left lower quadrant with a full bladder.  Review of Systems Pertinent positive and negative review of systems were noted in the above HPI section.  All other review of systems was otherwise negative.  Outpatient Encounter Prescriptions as of 01/10/2017  Medication Sig  . pantoprazole (PROTONIX) 40 MG tablet Take 40 mg by mouth daily.  Marland Kitchen dicyclomine (BENTYL) 10 MG capsule Take 1 tab by mouth every 6 hours as needed for cramping.  . [DISCONTINUED]  CALCIUM CITRATE PO Take 1 tablet by mouth daily.   . [DISCONTINUED] cholecalciferol (VITAMIN D) 1000 UNITS tablet Take 1,000 Units by mouth daily.    . [DISCONTINUED] Multiple Vitamin (MULTIVITAMIN WITH MINERALS) TABS Take 1 tablet by mouth daily.  . [DISCONTINUED] Omega-3 Fatty Acids (FISH OIL) 1000 MG CAPS Take 1,000 mg by mouth daily.   . [DISCONTINUED] VITAMIN E PO Take 1 tablet by mouth daily.   No facility-administered encounter medications on file as of 01/10/2017.    Allergies  Allergen Reactions  . Dexilant [Dexlansoprazole]     REACTION: pressure in ears  . Prevacid [Lansoprazole]     REACTION: pressure in ears  . Prilosec [Omeprazole]     REACTION: pressure in ears   Patient Active Problem List   Diagnosis Date Noted  . Osteopenia 09/23/2016  . 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 Postmenopausal estrogen deficiency 03/10/2015  . Other abnormal glucose 02/10/2014  . SVT (supraventricular tachycardia) ([place]) 06/08/2013  . PVC's (premature ventricular contractions) 07/28/2011  . Hyperlipidemia 04/12/2010  . AORTIC SCLEROSIS 04/12/2010  . COLONIC POLYPS, HX OF 04/12/2010  . ESOPHAGEAL STRICTURE 05/29/2009  . GERD 05/29/2009  . DIVERTICULOSIS, COLON 05/29/2009   Social History   Social History  . Marital status: Married    Spouse name: N/A  . Number of children: 2  . Years of education: N/A   Occupational History  . Network engineer   .  [employer]   Social History Main Topics  .  Smoking status: Never Smoker  . Smokeless tobacco: Never Used  . Alcohol use No     Comment: socially, once / month  . Drug use: No  . Sexual activity: Yes    Birth control/ protection: Post-menopausal   Other Topics Concern  . Not on file   Social History Narrative  . No narrative on file    Toni [name] family history includes Arthritis in her mother; COPD in her sister; Diabetes in her maternal grandmother; Heart attack (age of onset: 62) in her sister; Heart failure in her  sister; Hypertension in her mother; Hypertrophic cardiomyopathy in her sister; Hypothyroidism in her mother and sister; Osteopenia in her sister; Ovarian cancer in her sister; Stroke (age of onset: 35) in her father; Stroke (age of onset: 46) in her mother.      Objective:    Vitals:   01/10/17 1357  BP: 128/64  Pulse: 78    Physical Exam well-developed older white female in no acute distress, pleasant blood pressure 120/64 pulse 78, BMI 31.7. HEENT; nontraumatic normocephalic EOMI PERRLA sclera anicteric, Cardiovascular; regular rate and rhythm with S1-S2 no murmur or gallop, Pulmonary; clear bilaterally, Abdomen ;soft, she will does not have any left lower quadrant tenderness to exam no guarding or rebound no palpable mass or hepatosplenomegaly bowel sounds are present, Rectal ;exam not done, Extremities; no clubbing cyanosis or edema skin warm and dry, Neuropsych; mood and affect appropriate       Assessment & Plan:   #79 71 year old white female with 2 month history of left lower quadrant discomfort noted at night with a full bladder and also with laying on her left side. More recently he has had intermittent left lower quadrant crampy discomfort. She's not tender on exam and not convinced that she has diverticulitis. Rule out other pelvic lesion i.e. ovarian or bladder #2 history of tubular adenomatous colon polyps-will be due for follow-up colonoscopy 2021 #3 Barrett's esophagus-, history of low-grade dysplasia, hyperplastic changes on last biopsy September 2017. Patient now being followed by Dr. Adria Devon and underwent EGD with APC of nodular mucosa in the gastric cardia about 10 days ago and plan is for follow-up EGD in 2 months.  Plan'Bentyl 10 mg by mouth every 6 hours when necessary for cramping CBC with differential and BMET Schedule for CT of the abdomen and pelvis with contrast. I do not think she needs antibiotics at this time.  Averey Trompeter S Katlin Ciszewski PA-C 01/10/2017   Cc: Binnie Rail, MD  Addendum: Reviewed and agree with initial management. Pyrtle, Lajuan Lines, MD

## 2017-01-13 ENCOUNTER — Ambulatory Visit (INDEPENDENT_AMBULATORY_CARE_PROVIDER_SITE_OTHER)
Admission: RE | Admit: 2017-01-13 | Discharge: 2017-01-13 | Disposition: A | Discharge status: Home / Self Care | Payer: Medicare Other | Source: Ambulatory Visit | Attending: Physician Assistant | Admitting: Physician Assistant

## 2017-01-13 DIAGNOSIS — R1032 Left lower quadrant pain: Secondary | ICD-10-CM

## 2017-01-13 DIAGNOSIS — R300 Dysuria: Secondary | ICD-10-CM

## 2017-01-13 DIAGNOSIS — K5730 Diverticulosis of large intestine without perforation or abscess without bleeding: Secondary | ICD-10-CM | POA: Diagnosis not present

## 2017-01-13 MED ORDER — IOPAMIDOL (ISOVUE-300) INJECTION 61%
100.0000 mL | Freq: Once | INTRAVENOUS | Status: AC | PRN
  Administered 2017-01-13: 100 mL via INTRAVENOUS

## 2017-01-22 ENCOUNTER — Ambulatory Visit: Payer: Medicare Other | Admitting: Gastroenterology

## 2017-02-21 DIAGNOSIS — Z23 Encounter for immunization: Secondary | ICD-10-CM | POA: Diagnosis not present

## 2017-02-25 DIAGNOSIS — K222 Esophageal obstruction: Secondary | ICD-10-CM | POA: Diagnosis not present

## 2017-02-25 DIAGNOSIS — K449 Diaphragmatic hernia without obstruction or gangrene: Secondary | ICD-10-CM | POA: Diagnosis not present

## 2017-02-25 DIAGNOSIS — Z888 Allergy status to other drugs, medicaments and biological substances status: Secondary | ICD-10-CM | POA: Diagnosis not present

## 2017-02-25 DIAGNOSIS — K219 Gastro-esophageal reflux disease without esophagitis: Secondary | ICD-10-CM | POA: Diagnosis not present

## 2017-02-25 DIAGNOSIS — K2930 Chronic superficial gastritis without bleeding: Secondary | ICD-10-CM | POA: Diagnosis not present

## 2017-02-25 DIAGNOSIS — K2270 Barrett's esophagus without dysplasia: Secondary | ICD-10-CM | POA: Diagnosis not present

## 2017-02-25 DIAGNOSIS — Z79899 Other long term (current) drug therapy: Secondary | ICD-10-CM | POA: Diagnosis not present

## 2017-02-25 DIAGNOSIS — Z7982 Long term (current) use of aspirin: Secondary | ICD-10-CM | POA: Diagnosis not present

## 2017-02-25 DIAGNOSIS — K3189 Other diseases of stomach and duodenum: Secondary | ICD-10-CM | POA: Diagnosis not present

## 2017-05-13 DIAGNOSIS — L821 Other seborrheic keratosis: Secondary | ICD-10-CM | POA: Diagnosis not present

## 2017-05-13 DIAGNOSIS — D225 Melanocytic nevi of trunk: Secondary | ICD-10-CM | POA: Diagnosis not present

## 2017-08-05 ENCOUNTER — Other Ambulatory Visit: Payer: Self-pay | Admitting: Obstetrics & Gynecology

## 2017-08-05 DIAGNOSIS — Z139 Encounter for screening, unspecified: Secondary | ICD-10-CM

## 2017-09-08 ENCOUNTER — Ambulatory Visit
Admission: RE | Admit: 2017-09-08 | Discharge: 2017-09-08 | Disposition: A | Discharge status: Home / Self Care | Payer: Medicare Other | Source: Ambulatory Visit | Attending: Obstetrics & Gynecology | Admitting: Obstetrics & Gynecology

## 2017-09-08 DIAGNOSIS — Z1231 Encounter for screening mammogram for malignant neoplasm of breast: Secondary | ICD-10-CM | POA: Diagnosis not present

## 2017-09-08 DIAGNOSIS — Z139 Encounter for screening, unspecified: Secondary | ICD-10-CM

## 2017-10-07 ENCOUNTER — Ambulatory Visit: Payer: Medicare Other

## 2017-11-04 ENCOUNTER — Ambulatory Visit (INDEPENDENT_AMBULATORY_CARE_PROVIDER_SITE_OTHER): Payer: Medicare Other | Admitting: *Deleted

## 2017-11-04 VITALS — BP 138/82 | HR 75 | Resp 17 | Ht 63.0 in | Wt 174.0 lb

## 2017-11-04 DIAGNOSIS — Z0000 Encounter for general adult medical examination without abnormal findings: Secondary | ICD-10-CM | POA: Diagnosis not present

## 2017-11-04 NOTE — Progress Notes (Addendum)
Subjective:   Toni [name] is a 72 y.o. female who presents for Medicare Annual (Subsequent) preventive examination.  Review of Systems:  No ROS.  Medicare Wellness Visit. Additional risk factors are reflected in the social history.  Cardiac Risk Factors include: advanced age (>64men, >55 women);dyslipidemia Sleep patterns: feels rested on waking, gets up 1 times nightly to void and sleeps 6-7 hours nightly.    Home Safety/Smoke Alarms: Feels safe in home. Smoke alarms in place.  Living environment; residence and Firearm Safety: 1-story house/ trailer, no firearms Lives with husband, no needs for DME, good support system. Seat Belt Safety/Bike Helmet: Wears seat belt.      Objective:     Vitals: BP 138/82   Pulse 75   Resp 17   Ht 5\' 3"  (1.6 m)   Wt 174 lb (78.9 kg)   LMP 05/27/2006   SpO2 98%   BMI 30.82 kg/m   Body mass index is 30.82 kg/m.  Advanced Directives 11/04/2017 06/14/2016 02/13/2016 10/14/2014 06/08/2013  Does Patient Have a Medical Advance Directive? Yes No Yes Yes Patient has advance directive, copy not in chart  Type of Advance Directive [place];Living will - [place];Living will [place] in Chart? No - copy requested - No - copy requested - -    Tobacco Social History   Tobacco Use  Smoking Status Never Smoker  Smokeless Tobacco Never Used     Counseling given: Not Answered  Past Medical History:  Diagnosis Date  . Aortic sclerosis 2000   on ECHO  . Colon polyps    Dr Sharlett Iles  . Diverticulosis of colon (without mention of hemorrhage)   . Esophageal reflux   . Eye abnormality    OD ocular freckle  . H/O hiatal hernia   . Hyperlipidemia 1999   LDL 158, resolved with diet  . Premature ventricular contractions   . Stricture and stenosis of esophagus    dilation X 3  . SVT (supraventricular tachycardia) (HCC)    slow pathway ablated    Past Surgical History:  Procedure Laterality Date  . ANKLE SURGERY     right  . COLONOSCOPY    . COLONOSCOPY W/ POLYPECTOMY  2011   Dr Sharlett Iles  . KNEE ARTHROSCOPY     bilateral  . ovarian wedge resection     to allow pregnancy  . SUPRAVENTRICULAR TACHYCARDIA ABLATION N/A 06/08/2013   Procedure: SUPRAVENTRICULAR TACHYCARDIA ABLATION;  Surgeon: Coralyn Mark, MD;  Location: [facility] CATH LAB;  Service: Cardiovascular;  Laterality: N/A;  . TONSILLECTOMY     Family History  Problem Relation Age of Onset  . Stroke Father 2  . Arthritis Mother        OA  . Hypertension Mother   . Hypothyroidism Mother   . Stroke Mother 95       ischemic  . Heart attack Sister 67       smoker  . Ovarian cancer Sister   . Osteopenia Sister   . Hypertrophic cardiomyopathy Sister   . COPD Sister   . Heart failure Sister   . Hypothyroidism Sister   . Diabetes Maternal Grandmother   . Colon cancer Neg Hx   . Esophageal cancer Neg Hx   . Liver disease Neg Hx   . Stomach cancer Neg Hx   . Rectal cancer Neg Hx    Social History   Socioeconomic History  . Marital  status: Married    Spouse name: Not on file  . Number of children: 2  . Years of education: Not on file  . Highest education level: Not on file  Occupational History  . Occupation: Producer, 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[employer]  . Financial resource strain: Not hard at all  . Food insecurity:    Worry: Never true    Inability: Never true  . Transportation needs:    Medical: No    Non-medical: No  Tobacco Use  . Smoking status: Never Smoker  . Smokeless tobacco: Never Used  Substance and Sexual Activity  . Alcohol use: No    Alcohol/week: 0.0 oz    Comment: socially, once / month  . Drug use: No  . Sexual activity: Yes    Birth control/protection: Post-menopausal  Lifestyle  . Physical activity:    Days per week: 4 days    Minutes per session: 50 min  . Stress: Not at all  Relationships  . Social connections:     Talks on phone: More than three times a week    Gets together: More than three times a week    Attends religious service: More than 4 times per year    Active member of club or organization: Yes    Attends meetings of clubs or organizations: More than 4 times per year    Relationship status: Married  Other Topics Concern  . Not on file  Social History Narrative  . Not on file    Outpatient Encounter Medications as of 11/04/2017  Medication Sig  . dicyclomine (BENTYL) 10 MG capsule Take 1 tab by mouth every 6 hours as needed for cramping.  . pantoprazole (PROTONIX) 40 MG tablet Take 40 mg by mouth daily.   No facility-administered encounter medications on file as of 11/04/2017.     Activities of Daily Living In your present state of health, do you have any difficulty performing the following activities: 11/04/2017  Hearing? N  Vision? N  Difficulty concentrating or making decisions? N  Walking or climbing stairs? N  Dressing or bathing? N  Doing errands, shopping? N  Preparing Food and eating ? N  Using the Toilet? N  In the past six months, have you accidently leaked urine? N  Do you have problems with loss of bowel control? N  Managing your Medications? N  Managing your Finances? N  Housekeeping or managing your Housekeeping? N  Some recent data might be hidden    Patient Care Team: Binnie Rail, MD as PCP - General (Internal Medicine) Thompson Grayer, MD as PCP - Cardiology (Cardiology)    Assessment:   This is a routine wellness examination for Toni [name]. Physical assessment deferred to PCP.   Exercise Activities and Dietary recommendations Current Exercise Habits: Home exercise routine, Type of exercise: walking, Time (Minutes): 40, Frequency (Times/Week): 4, Weekly Exercise (Minutes/Week): 160, Intensity: Mild, Exercise limited by: orthopedic condition(s)  Diet (meal preparation, eat out, water intake, caffeinated beverages, dairy products, fruits and vegetables):  in general, a "healthy" diet  , well balanced   Reviewed heart healthy diet, Encouraged patient to increase daily water and fluid intake.  Goals    . Patient Stated     I want to lose weight by eating healthy and exercise. I want to enjoy life, family and travel.       Fall Risk Fall Risk  11/04/2017 01/19/2016  Falls in the past year? No No  Comment - Emmi  Telephone Survey: data to providers prior to load  Cognitive Function       Ad8 score reviewed for issues:  Issues making decisions: no  Less interest in hobbies / activities: no  Repeats questions, stories (family complaining): no  Trouble using ordinary gadgets (microwave, computer, phone):no  Forgets the month or year: no  Mismanaging finances: no  Remembering appts: no  Daily problems with thinking and/or memory: no Ad8 score is= 0  Immunization History  Administered Date(s) Administered  . Influenza,inj,Quad PF,6+ Mos 02/24/2013, 02/07/2015  . Influenza-Unspecified 02/10/2014  . Pneumococcal Conjugate-13 02/16/2014  . Pneumococcal Polysaccharide-23 06/08/2015  . Td 05/27/2001  . Tdap 06/27/2010, 02/26/2011  . Zoster 04/12/2010   Screening Tests Health Maintenance  Topic Date Due  . INFLUENZA VACCINE  12/25/2017  . DEXA SCAN  05/07/2018  . MAMMOGRAM  09/09/2019  . COLONOSCOPY  10/14/2019  . TETANUS/TDAP  02/25/2021  . Hepatitis C Screening  Completed  . PNA vac Low Risk Adult  Completed      Plan:    Continue doing brain stimulating activities (puzzles, reading, adult coloring books, staying active) to keep memory sharp.   Continue to eat heart healthy diet (full of fruits, vegetables, whole grains, lean protein, water--limit salt, fat, and sugar intake) and increase physical activity as tolerated.  I have personally reviewed and noted the following in the patient's chart:   . Medical and social history . Use of alcohol, tobacco or illicit drugs  . Current medications and  supplements . Functional ability and status . Nutritional status . Physical activity . Advanced directives . List of other physicians . Vitals . Screenings to include cognitive, depression, and falls . Referrals and appointments  In addition, I have reviewed and discussed with patient certain preventive protocols, quality metrics, and best practice recommendations. A written personalized care plan for preventive services as well as general preventive health recommendations were provided to patient.     Michiel Cowboy, RN  11/04/2017   Medical screening examination/treatment/procedure(s) were performed by non-physician practitioner and as supervising physician I was immediately available for consultation/collaboration. I agree with above. Binnie Rail, MD

## 2017-11-04 NOTE — Patient Instructions (Signed)
Continue doing brain stimulating activities (puzzles, reading, adult coloring books, staying active) to keep memory sharp.   Continue to eat heart healthy diet (full of fruits, vegetables, whole grains, lean protein, water--limit salt, fat, and sugar intake) and increase physical activity as tolerated.   Toni [name] , Thank you for taking time to come for your Medicare Wellness Visit. I appreciate your ongoing commitment to your health goals. Please review the following plan we discussed and let me know if I can assist you in the future.   These are the goals we discussed: Goals    . Patient Stated     I want to lose weight by eating healthy and exercise. I want to enjoy life, family and travel.       This is a list of the screening recommended for you and due dates:  Health Maintenance  Topic Date Due  . Flu Shot  12/25/2017  . DEXA scan (bone density measurement)  05/07/2018  . Mammogram  09/09/2019  . Colon Cancer Screening  10/14/2019  . Tetanus Vaccine  02/25/2021  .  Hepatitis C: One time screening is recommended by Center for Disease Control  (CDC) for  adults born from [birth year] through 1963.   Completed  . Pneumonia vaccines  Completed   Health Maintenance, Female Adopting a healthy lifestyle and getting preventive care can go a long way to promote health and wellness. Talk with your health care provider about what schedule of regular examinations is right for you. This is a good chance for you to check in with your provider about disease prevention and staying healthy. In between checkups, there are plenty of things you can do on your own. Experts have done a lot of research about which lifestyle changes and preventive measures are most likely to keep you healthy. Ask your health care provider for more information. Weight and diet Eat a healthy diet  Be sure to include plenty of vegetables, fruits, low-fat dairy products, and lean protein.  Do not eat a lot of foods high in  solid fats, added sugars, or salt.  Get regular exercise. This is one of the most important things you can do for your health. ? Most adults should exercise for at least 150 minutes each week. The exercise should increase your heart rate and make you sweat (moderate-intensity exercise). ? Most adults should also do strengthening exercises at least twice a week. This is in addition to the moderate-intensity exercise.  Maintain a healthy weight  Body mass index (BMI) is a measurement that can be used to identify possible weight problems. It estimates body fat based on height and weight. Your health care provider can help determine your BMI and help you achieve or maintain a healthy weight.  For females 70 years of age and older: ? A BMI below 18.5 is considered underweight. ? A BMI of 18.5 to 24.9 is normal. ? A BMI of 25 to 29.9 is considered overweight. ? A BMI of 30 and above is considered obese.  Watch levels of cholesterol and blood lipids  You should start having your blood tested for lipids and cholesterol at 70 years of age, then have this test every 5 years.  You may need to have your cholesterol levels checked more often if: ? Your lipid or cholesterol levels are high. ? You are older than 70 years of age. ? You are at high risk for heart disease.  Cancer screening Lung Cancer  Lung cancer screening is recommended  for adults 17-32 years old who are at high risk for lung cancer because of a history of smoking.  A yearly low-dose CT scan of the lungs is recommended for people who: ? Currently smoke. ? Have quit within the past 15 years. ? Have at least a 30-pack-year history of smoking. A pack year is smoking an average of one pack of cigarettes a day for 1 year.  Yearly screening should continue until it has been 15 years since you quit.  Yearly screening should stop if you develop a health problem that would prevent you from having lung cancer treatment.  Breast  Cancer  Practice breast self-awareness. This means understanding how your breasts normally appear and feel.  It also means doing regular breast self-exams. Let your health care provider know about any changes, no matter how small.  If you are in your 20s or 30s, you should have a clinical breast exam (CBE) by a health care provider every 1-3 years as part of a regular health exam.  If you are 72 or older, have a CBE every year. Also consider having a breast X-ray (mammogram) every year.  If you have a family history of breast cancer, talk to your health care provider about genetic screening.  If you are at high risk for breast cancer, talk to your health care provider about having an MRI and a mammogram every year.  Breast cancer gene (BRCA) assessment is recommended for women who have family members with BRCA-related cancers. BRCA-related cancers include: ? Breast. ? Ovarian. ? Tubal. ? Peritoneal cancers.  Results of the assessment will determine the need for genetic counseling and BRCA1 and BRCA2 testing.  Cervical Cancer Your health care provider may recommend that you be screened regularly for cancer of the pelvic organs (ovaries, uterus, and vagina). This screening involves a pelvic examination, including checking for microscopic changes to the surface of your cervix (Pap test). You may be encouraged to have this screening done every 3 years, beginning at age 42.  For women ages 9-65, health care providers may recommend pelvic exams and Pap testing every 3 years, or they may recommend the Pap and pelvic exam, combined with testing for human papilloma virus (HPV), every 5 years. Some types of HPV increase your risk of cervical cancer. Testing for HPV may also be done on women of any age with unclear Pap test results.  Other health care providers may not recommend any screening for nonpregnant women who are considered low risk for pelvic cancer and who do not have symptoms. Ask your  health care provider if a screening pelvic exam is right for you.  If you have had past treatment for cervical cancer or a condition that could lead to cancer, you need Pap tests and screening for cancer for at least 20 years after your treatment. If Pap tests have been discontinued, your risk factors (such as having a new sexual partner) need to be reassessed to determine if screening should resume. Some women have medical problems that increase the chance of getting cervical cancer. In these cases, your health care provider may recommend more frequent screening and Pap tests.  Colorectal Cancer  This type of cancer can be detected and often prevented.  Routine colorectal cancer screening usually begins at 70 years of age and continues through 70 years of age.  Your health care provider may recommend screening at an earlier age if you have risk factors for colon cancer.  Your health care provider may also  recommend using home test kits to check for hidden blood in the stool.  A small camera at the end of a tube can be used to examine your colon directly (sigmoidoscopy or colonoscopy). This is done to check for the earliest forms of colorectal cancer.  Routine screening usually begins at age 31.  Direct examination of the colon should be repeated every 5-10 years through 70 years of age. However, you may need to be screened more often if early forms of precancerous polyps or small growths are found.  Skin Cancer  Check your skin from head to toe regularly.  Tell your health care provider about any new moles or changes in moles, especially if there is a change in a mole's shape or color.  Also tell your health care provider if you have a mole that is larger than the size of a pencil eraser.  Always use sunscreen. Apply sunscreen liberally and repeatedly throughout the day.  Protect yourself by wearing long sleeves, pants, a wide-brimmed hat, and sunglasses whenever you are  outside.  Heart disease, diabetes, and high blood pressure  High blood pressure causes heart disease and increases the risk of stroke. High blood pressure is more likely to develop in: ? People who have blood pressure in the high end of the normal range (130-139/85-89 mm Hg). ? People who are overweight or obese. ? People who are African American.  If you are 58-37 years of age, have your blood pressure checked every 3-5 years. If you are 77 years of age or older, have your blood pressure checked every year. You should have your blood pressure measured twice-once when you are at a hospital or clinic, and once when you are not at a hospital or clinic. Record the average of the two measurements. To check your blood pressure when you are not at a hospital or clinic, you can use: ? An automated blood pressure machine at a pharmacy. ? A home blood pressure monitor.  If you are between 66 years and 11 years old, ask your health care provider if you should take aspirin to prevent strokes.  Have regular diabetes screenings. This involves taking a blood sample to check your fasting blood sugar level. ? If you are at a normal weight and have a low risk for diabetes, have this test once every three years after 70 years of age. ? If you are overweight and have a high risk for diabetes, consider being tested at a younger age or more often. Preventing infection Hepatitis B  If you have a higher risk for hepatitis B, you should be screened for this virus. You are considered at high risk for hepatitis B if: ? You were born in a country where hepatitis B is common. Ask your health care provider which countries are considered high risk. ? Your parents were born in a high-risk country, and you have not been immunized against hepatitis B (hepatitis B vaccine). ? You have HIV or AIDS. ? You use needles to inject street drugs. ? You live with someone who has hepatitis B. ? You have had sex with someone who has  hepatitis B. ? You get hemodialysis treatment. ? You take certain medicines for conditions, including cancer, organ transplantation, and autoimmune conditions.  Hepatitis C  Blood testing is recommended for: ? Everyone born from [birth year] through 1963. ? Anyone with known risk factors for hepatitis C.  Sexually transmitted infections (STIs)  You should be screened for sexually transmitted infections (STIs) including  gonorrhea and chlamydia if: ? You are sexually active and are younger than 70 years of age. ? You are older than 70 years of age and your health care provider tells you that you are at risk for this type of infection. ? Your sexual activity has changed since you were last screened and you are at an increased risk for chlamydia or gonorrhea. Ask your health care provider if you are at risk.  If you do not have HIV, but are at risk, it may be recommended that you take a prescription medicine daily to prevent HIV infection. This is called pre-exposure prophylaxis (PrEP). You are considered at risk if: ? You are sexually active and do not regularly use condoms or know the HIV status of your partner(s). ? You take drugs by injection. ? You are sexually active with a partner who has HIV.  Talk with your health care provider about whether you are at high risk of being infected with HIV. If you choose to begin PrEP, you should first be tested for HIV. You should then be tested every 3 months for as long as you are taking PrEP. Pregnancy  If you are premenopausal and you may become pregnant, ask your health care provider about preconception counseling.  If you may become pregnant, take 400 to 800 micrograms (mcg) of folic acid every day.  If you want to prevent pregnancy, talk to your health care provider about birth control (contraception). Osteoporosis and menopause  Osteoporosis is a disease in which the bones lose minerals and strength with aging. This can result in serious bone  fractures. Your risk for osteoporosis can be identified using a bone density scan.  If you are 62 years of age or older, or if you are at risk for osteoporosis and fractures, ask your health care provider if you should be screened.  Ask your health care provider whether you should take a calcium or vitamin D supplement to lower your risk for osteoporosis.  Menopause may have certain physical symptoms and risks.  Hormone replacement therapy may reduce some of these symptoms and risks. Talk to your health care provider about whether hormone replacement therapy is right for you. Follow these instructions at home:  Schedule regular health, dental, and eye exams.  Stay current with your immunizations.  Do not use any tobacco products including cigarettes, chewing tobacco, or electronic cigarettes.  If you are pregnant, do not drink alcohol.  If you are breastfeeding, limit how much and how often you drink alcohol.  Limit alcohol intake to no more than 1 drink per day for nonpregnant women. One drink equals 12 ounces of beer, 5 ounces of Toni [name], or 1 ounces of hard liquor.  Do not use street drugs.  Do not share needles.  Ask your health care provider for help if you need support or information about quitting drugs.  Tell your health care provider if you often feel depressed.  Tell your health care provider if you have ever been abused or do not feel safe at home. This information is not intended to replace advice given to you by your health care provider. Make sure you discuss any questions you have with your health care provider. Document Released: 11/26/2010 Document Revised: 10/19/2015 Document Reviewed: 02/14/2015 Elsevier Interactive Patient Education  [employer].

## 2017-12-04 NOTE — Patient Instructions (Addendum)
You can try flonase or nasonex.  You can also try zyrtec.     If your dizziness does not improve let me know.   Test(s) ordered today. Your results will be released to [facility] (or called to you) after review, usually within 72hours after test completion. If any changes need to be made, you will be notified at that same time.  Medications reviewed and updated.  No changes recommended at this time.   Please followup in one year

## 2017-12-04 NOTE — Progress Notes (Signed)
Subjective:    Patient ID: Toni [name], female    DOB: [birth year], 72 y.o.   MRN: [id number]  HPI Here follow up of her chronic medical problems.    Vertigo: In the past three days she has had pressure in her ears.  She has had some dizziness with bending over and changes in head position.  She denies ear pain.  She denies a history of vertigo.   Large hiatal hernia, GERD:  She is taking her medication daily as prescribed.  She denies any GERD symptoms and feels her GERD is well controlled.   Osteopenia: Dexa done in 2016 and is due this year.  She is exercising some.  She takes calcium and vitamin d.    She is exercising some.  She thinks she eats fairly healthy.    Medications and allergies reviewed with patient and updated if appropriate.  Patient Active Problem List   Diagnosis Date Noted  . Osteopenia 09/23/2016  . 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 Postmenopausal estrogen deficiency 03/10/2015  . Other abnormal glucose 02/10/2014  . SVT (supraventricular tachycardia) ([place]) 06/08/2013  . PVC's (premature ventricular contractions) 07/28/2011  . Hyperlipidemia 04/12/2010  . AORTIC SCLEROSIS 04/12/2010  . COLONIC POLYPS, HX OF 04/12/2010  . ESOPHAGEAL STRICTURE 05/29/2009  . GERD 05/29/2009  . DIVERTICULOSIS, COLON 05/29/2009    Current Outpatient Medications on File Prior to Visit  Medication Sig Dispense Refill  . aspirin-acetaminophen-caffeine (EXCEDRIN EXTRA STRENGTH) 250-250-65 MG tablet Take by mouth.    . dicyclomine (BENTYL) 10 MG capsule Take 1 tab by mouth every 6 hours as needed for cramping. 40 capsule 2  . pantoprazole (PROTONIX) 40 MG tablet Take 40 mg by mouth daily.     No current facility-administered medications on file prior to visit.     Past Medical History:  Diagnosis Date  . Aortic sclerosis 2000   on ECHO  . Colon polyps    Dr Sharlett Iles  . Diverticulosis of colon (without mention of hemorrhage)   . Esophageal reflux   . Eye  abnormality    OD ocular freckle  . H/O hiatal hernia   . Hyperlipidemia 1999   LDL 158, resolved with diet  . Premature ventricular contractions   . Stricture and stenosis of esophagus    dilation X 3  . SVT (supraventricular tachycardia) ([facility])    slow pathway ablated    Past Surgical History:  Procedure Laterality Date  . ANKLE SURGERY     right  . COLONOSCOPY    . COLONOSCOPY W/ POLYPECTOMY  2011   Dr Sharlett Iles  . KNEE ARTHROSCOPY     bilateral  . ovarian wedge resection     to allow pregnancy  . SUPRAVENTRICULAR TACHYCARDIA ABLATION N/A 06/08/2013   Procedure: SUPRAVENTRICULAR TACHYCARDIA ABLATION;  Surgeon: Coralyn Mark, MD;  Location: [facility] CATH LAB;  Service: Cardiovascular;  Laterality: N/A;  . TONSILLECTOMY      Social History   Socioeconomic History  . Marital status: Married    Spouse name: Not on file  . Number of children: 2  . Years of education: Not on file  . Highest education level: Not on file  Occupational History  . Occupation: Producer, 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[employer]  . Financial resource strain: Not hard at all  . Food insecurity:    Worry: Never true    Inability: Never true  . Transportation needs:    Medical: No    Non-medical: No  Tobacco Use  . Smoking status: Never Smoker  . Smokeless tobacco: Never Used  Substance and Sexual Activity  . Alcohol use: No    Alcohol/week: 0.0 oz    Comment: socially, once / month  . Drug use: No  . Sexual activity: Yes    Birth control/protection: Post-menopausal  Lifestyle  . Physical activity:    Days per week: 4 days    Minutes per session: 50 min  . Stress: Not at all  Relationships  . Social connections:    Talks on phone: More than three times a week    Gets together: More than three times a week    Attends religious service: More than 4 times per year    Active member of club or organization: Yes    Attends meetings of clubs or organizations: More than 4 times per year     Relationship status: Married  Other Topics Concern  . Not on file  Social History Narrative  . Not on file    Family History  Problem Relation Age of Onset  . Stroke Father 80  . Arthritis Mother        OA  . Hypertension Mother   . Hypothyroidism Mother   . Stroke Mother 95       ischemic  . Heart attack Sister 51       smoker  . Ovarian cancer Sister   . Osteopenia Sister   . Hypertrophic cardiomyopathy Sister   . COPD Sister   . Heart failure Sister   . Hypothyroidism Sister   . Diabetes Maternal Grandmother   . Colon cancer Neg Hx   . Esophageal cancer Neg Hx   . Liver disease Neg Hx   . Stomach cancer Neg Hx   . Rectal cancer Neg Hx     Review of Systems  Constitutional: Negative for chills and fever.  HENT: Negative for congestion, ear pain, sinus pressure, sinus pain and sore throat.   Respiratory: Negative for cough, shortness of breath and wheezing.   Cardiovascular: Negative for chest pain, palpitations and leg swelling.  Gastrointestinal: Negative for nausea.       Gerd controlled  Musculoskeletal: Negative for back pain.  Neurological: Positive for dizziness. Negative for weakness, numbness and headaches.       Objective:   Vitals:   12/05/17 1003  BP: 132/80  Pulse: 69  Temp: 98.2 F (36.8 C)   BP Readings from Last 3 Encounters:  12/05/17 132/80  11/04/17 138/82  01/10/17 128/64   Wt Readings from Last 3 Encounters:  12/05/17 177 lb 1.6 oz (80.3 kg)  11/04/17 174 lb (78.9 kg)  01/10/17 179 lb (81.2 kg)   Body mass index is 31.37 kg/m.   Physical Exam    Constitutional: Appears well-developed and well-nourished. No distress.  HENT:  Head: Normocephalic and atraumatic.  Neck: normal ear canals and TM b/l.  oropharynx normal.  Neck supple. No tracheal deviation present. No thyromegaly present.  No cervical lymphadenopathy Cardiovascular: Normal rate, regular rhythm and normal heart sounds.   No murmur heard. No carotid bruit .  No  edema Pulmonary/Chest: Effort normal and breath sounds normal. No respiratory distress. No has no wheezes. No rales.  Neuro:  Normal strength and sensation in all extremities.  CN II-XII intact.  Skin: Skin is warm and dry. Not diaphoretic.  Psychiatric: Normal mood and affect. Behavior is normal.      Assessment & Plan:    See Problem List for Assessment  and Plan of chronic medical problems.

## 2017-12-05 ENCOUNTER — Encounter: Payer: Self-pay | Admitting: Internal Medicine

## 2017-12-05 ENCOUNTER — Ambulatory Visit (INDEPENDENT_AMBULATORY_CARE_PROVIDER_SITE_OTHER): Payer: Medicare Other | Admitting: Internal Medicine

## 2017-12-05 ENCOUNTER — Other Ambulatory Visit (INDEPENDENT_AMBULATORY_CARE_PROVIDER_SITE_OTHER): Payer: Medicare Other

## 2017-12-05 VITALS — BP 132/80 | HR 69 | Temp 98.2°F | Ht 63.0 in | Wt 177.1 lb

## 2017-12-05 DIAGNOSIS — E782 Mixed hyperlipidemia: Secondary | ICD-10-CM

## 2017-12-05 DIAGNOSIS — R7309 Other abnormal glucose: Secondary | ICD-10-CM

## 2017-12-05 DIAGNOSIS — K219 Gastro-esophageal reflux disease without esophagitis: Secondary | ICD-10-CM

## 2017-12-05 DIAGNOSIS — R42 Dizziness and giddiness: Secondary | ICD-10-CM

## 2017-12-05 DIAGNOSIS — M85852 Other specified disorders of bone density and structure, left thigh: Secondary | ICD-10-CM

## 2017-12-05 LAB — LIPID PANEL
CHOL/HDL RATIO: 3
Cholesterol: 208 mg/dL — ABNORMAL HIGH (ref 0–200)
HDL: 63.9 mg/dL (ref >39.00)
LDL CALC: 122 mg/dL — AB (ref 0–99)
NONHDL: 144.23
TRIGLYCERIDES: 112 mg/dL (ref 0.0–149.0)
VLDL: 22.4 mg/dL (ref 0.0–40.0)

## 2017-12-05 LAB — COMPREHENSIVE METABOLIC PANEL
ALT: 14 U/L (ref 0–35)
AST: 13 U/L (ref 0–37)
Albumin: 4.3 g/dL (ref 3.5–5.2)
Alkaline Phosphatase: 86 U/L (ref 39–117)
BUN: 18 mg/dL (ref 6–23)
CHLORIDE: 105 meq/L (ref 96–112)
CO2: 29 meq/L (ref 19–32)
Calcium: 9.4 mg/dL (ref 8.4–10.5)
Creatinine, Ser: 0.75 mg/dL (ref 0.40–1.20)
GFR: 81.1 mL/min (ref >60.00)
GLUCOSE: 96 mg/dL (ref 70–99)
Potassium: 4.1 mEq/L (ref 3.5–5.1)
SODIUM: 142 meq/L (ref 135–145)
Total Bilirubin: 0.7 mg/dL (ref 0.2–1.2)
Total Protein: 7.2 g/dL (ref 6.0–8.3)

## 2017-12-05 LAB — CBC WITH DIFFERENTIAL/PLATELET
BASOS PCT: 0.4 % (ref 0.0–3.0)
Basophils Absolute: 0 10*3/uL (ref 0.0–0.1)
EOS ABS: 0 10*3/uL (ref 0.0–0.7)
Eosinophils Relative: 0.8 % (ref 0.0–5.0)
HEMATOCRIT: 41.9 % (ref 36.0–46.0)
Hemoglobin: 14.6 g/dL (ref 12.0–15.0)
LYMPHS ABS: 1.8 10*3/uL (ref 0.7–4.0)
Lymphocytes Relative: 31.3 % (ref 12.0–46.0)
MCHC: 34.8 g/dL (ref 30.0–36.0)
MCV: 85.2 fl (ref 78.0–100.0)
Monocytes Absolute: 0.3 10*3/uL (ref 0.1–1.0)
Monocytes Relative: 5 % (ref 3.0–12.0)
NEUTROS ABS: 3.7 10*3/uL (ref 1.4–7.7)
NEUTROS PCT: 62.5 % (ref 43.0–77.0)
PLATELETS: 240 10*3/uL (ref 150.0–400.0)
RBC: 4.91 Mil/uL (ref 3.87–5.11)
RDW: 13.4 % (ref 11.5–15.5)
WBC: 5.9 10*3/uL (ref 4.0–10.5)

## 2017-12-05 LAB — TSH: TSH: 2.5 u[IU]/mL (ref 0.35–4.50)

## 2017-12-05 LAB — HEMOGLOBIN A1C: Hgb A1c MFr Bld: 5.4 % (ref 4.6–6.5)

## 2017-12-07 ENCOUNTER — Encounter: Payer: Self-pay | Admitting: Internal Medicine

## 2017-12-07 DIAGNOSIS — R42 Dizziness and giddiness: Secondary | ICD-10-CM | POA: Insufficient documentation

## 2017-12-07 NOTE — Assessment & Plan Note (Signed)
Having vertigo Likely BPPV, may be labyrinthitis Discussed treatment options Deferred meclizine - can try dramamine Can try zyrtec, nasal spray If no improvement consider PT

## 2017-12-07 NOTE — Assessment & Plan Note (Signed)
Check lipid panel  Not on medication Regular exercise and healthy diet encouraged  

## 2017-12-07 NOTE — Assessment & Plan Note (Signed)
Calcium and vitamin d daily and regular exercise stressed dexa due later this year

## 2017-12-07 NOTE — Assessment & Plan Note (Signed)
GERD controlled Continue daily medication  

## 2017-12-07 NOTE — Assessment & Plan Note (Signed)
a1c

## 2017-12-27 ENCOUNTER — Ambulatory Visit (INDEPENDENT_AMBULATORY_CARE_PROVIDER_SITE_OTHER): Payer: Medicare Other | Admitting: Family Medicine

## 2017-12-27 ENCOUNTER — Encounter: Payer: Self-pay | Admitting: Family Medicine

## 2017-12-27 VITALS — BP 134/76 | HR 84 | Temp 98.2°F | Resp 16 | Wt 176.0 lb

## 2017-12-27 DIAGNOSIS — H60332 Swimmer's ear, left ear: Secondary | ICD-10-CM

## 2017-12-27 DIAGNOSIS — H6090 Unspecified otitis externa, unspecified ear: Secondary | ICD-10-CM | POA: Insufficient documentation

## 2017-12-27 DIAGNOSIS — H00012 Hordeolum externum right lower eyelid: Secondary | ICD-10-CM | POA: Diagnosis not present

## 2017-12-27 DIAGNOSIS — H00019 Hordeolum externum unspecified eye, unspecified eyelid: Secondary | ICD-10-CM | POA: Insufficient documentation

## 2017-12-27 MED ORDER — NEOMYCIN-POLYMYXIN-HC 3.5-10000-1 OT SOLN: 4.0000 [drp] | Freq: Four times a day (QID) | OTIC | 0 refills | Status: DC

## 2017-12-27 NOTE — Assessment & Plan Note (Signed)
Lateral L lower lid -no redness or sign of infection Recommend gentle cleansing and warm compresses  Avoid eye make up on that eye  Update if not starting to improve in a week or if worsening
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
## 2017-12-27 NOTE — Patient Instructions (Addendum)
Keep ear dry  Use the cortisporin drops in left ear 4 drops 4 times daily  An anti inflammatory like ibuprofen as needed for pain and swelling  Update if not starting to improve in a week or if worsening    For the stye -warm compresses Keep clean - baby shampoo and water

## 2017-12-27 NOTE — Assessment & Plan Note (Signed)
L ear -after getting water in it last weekend (prone to in the past)  Canal is not swollen shut-no wick needed  Px cortisporin otic 4 drops in aff ear QID Keep water out/no q tips Disc symptomatic care - see instructions on AVS  Update if not starting to improve in a week or if worsening

## 2017-12-30 ENCOUNTER — Telehealth: Payer: Self-pay | Admitting: Emergency Medicine

## 2017-12-30 ENCOUNTER — Ambulatory Visit (INDEPENDENT_AMBULATORY_CARE_PROVIDER_SITE_OTHER): Payer: Medicare Other | Admitting: Family

## 2017-12-30 ENCOUNTER — Encounter: Payer: Self-pay | Admitting: Family

## 2017-12-30 VITALS — BP 132/70 | HR 80 | Temp 97.7°F | Ht 63.0 in | Wt 174.1 lb

## 2017-12-30 DIAGNOSIS — H60502 Unspecified acute noninfective otitis externa, left ear: Secondary | ICD-10-CM

## 2017-12-30 MED ORDER — AMOXICILLIN-POT CLAVULANATE 875-125 MG PO TABS: 1.0000 | ORAL_TABLET | Freq: Two times a day (BID) | ORAL | 0 refills | Status: DC

## 2017-12-30 NOTE — Telephone Encounter (Signed)
Spoke with pt, pt saw Jodi Mourning, NP today.

## 2017-12-30 NOTE — Patient Instructions (Signed)
Otitis Externa Otitis externa is an infection of the outer ear canal. The outer ear canal is the area between the outside of the ear and the eardrum. Otitis externa is sometimes called "swimmer's ear." Follow these instructions at home:  If you were given antibiotic ear drops, use them as told by your doctor. Do not stop using them even if your condition gets better.  Take over-the-counter and prescription medicines only as told by your doctor.  Keep all follow-up visits as told by your doctor. This is important. How is this prevented?  Keep your ear dry. Use the corner of a towel to dry your ear after you swim or bathe.  Try not to scratch or put things in your ear. Doing these things makes it easier for germs to grow in your ear.  Avoid swimming in lakes, dirty water, or pools that may not have the right amount of a chemical called chlorine.  Consider making ear drops and putting 3 or 4 drops in each ear after you swim. Ask your doctor about how you can make ear drops. Contact a doctor if:  You have a fever.  After 3 days your ear is still red, swollen, or painful.  After 3 days you still have pus coming from your ear.  Your redness, swelling, or pain gets worse.  You have a really bad headache.  You have redness, swelling, pain, or tenderness behind your ear. This information is not intended to replace advice given to you by your health care provider. Make sure you discuss any questions you have with your health care provider. Document Released: 10/30/2007 Document Revised: 06/08/2015 Document Reviewed: 02/20/2015 Elsevier Interactive Patient Education  2018 Elsevier Inc.  

## 2017-12-30 NOTE — Telephone Encounter (Signed)
Copied from [place] [id number]. Topic: General - Other >> Dec 30, 2017  9:04 AM Keene Breath wrote: Reason for CRM: Patient called to inform doctor that since she came in on past Saturday, 12/27/17, the medication she received for her ear pain has not helped at all.  Doctor [employer] said for her to call the office if this medication did not help her.  Patient would like to know what is the next course of action she should take.  Please advise.  CB# [phone].

## 2017-12-30 NOTE — Telephone Encounter (Signed)
Can she come in to have her ear looked at?

## 2017-12-30 NOTE — Progress Notes (Signed)
Toni [name] is a 72 y.o. female with the following history as recorded in Epic[employer]:  Patient Active Problem List   Diagnosis Date Noted  . Otitis externa 12/27/2017  . Stye 12/27/2017  . Vertigo 12/07/2017  . Osteopenia 09/23/2016  . 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 Postmenopausal estrogen deficiency 03/10/2015  . Other abnormal glucose 02/10/2014  . SVT (supraventricular tachycardia) ([place]) 06/08/2013  . PVC's (premature ventricular contractions) 07/28/2011  . Hyperlipidemia 04/12/2010  . AORTIC SCLEROSIS 04/12/2010  . COLONIC POLYPS, HX OF 04/12/2010  . ESOPHAGEAL STRICTURE 05/29/2009  . GERD 05/29/2009  . DIVERTICULOSIS, COLON 05/29/2009    Current Outpatient Medications  Medication Sig Dispense Refill  . aspirin-acetaminophen-caffeine (EXCEDRIN EXTRA STRENGTH) 250-250-65 MG tablet Take by mouth.    . dicyclomine (BENTYL) 10 MG capsule Take 1 tab by mouth every 6 hours as needed for cramping. 40 capsule 2  . neomycin-polymyxin-hydrocortisone (CORTISPORIN) OTIC solution Place 4 drops into the left ear 4 (four) times daily. 10 mL 0  . pantoprazole (PROTONIX) 40 MG tablet Take 40 mg by mouth daily.    Marland Kitchen amoxicillin-clavulanate (AUGMENTIN) 875-125 MG tablet Take 1 tablet by mouth 2 (two) times daily. 20 tablet 0   No current facility-administered medications for this visit.     Allergies: Dexilant [dexlansoprazole]; Prevacid [lansoprazole]; and Prilosec [omeprazole]  Past Medical History:  Diagnosis Date  . Aortic sclerosis 2000   on ECHO  . Colon polyps    Dr Sharlett Iles  . Diverticulosis of colon (without mention of hemorrhage)   . Esophageal reflux   . Eye abnormality    OD ocular freckle  . H/O hiatal hernia   . Hyperlipidemia 1999   LDL 158, resolved with diet  . Premature ventricular contractions   . Stricture and stenosis of esophagus    dilation X 3  . SVT (supraventricular tachycardia) ([facility])    slow pathway ablated    Past Surgical History:  Procedure  Laterality Date  . ANKLE SURGERY     right  . COLONOSCOPY    . COLONOSCOPY W/ POLYPECTOMY  2011   Dr Sharlett Iles  . KNEE ARTHROSCOPY     bilateral  . ovarian wedge resection     to allow pregnancy  . SUPRAVENTRICULAR TACHYCARDIA ABLATION N/A 06/08/2013   Procedure: SUPRAVENTRICULAR TACHYCARDIA ABLATION;  Surgeon: Coralyn Mark, MD;  Location: [facility] CATH LAB;  Service: Cardiovascular;  Laterality: N/A;  . TONSILLECTOMY      Family History  Problem Relation Age of Onset  . Stroke Father 94  . Arthritis Mother        OA  . Hypertension Mother   . Hypothyroidism Mother   . Stroke Mother 95       ischemic  . Heart attack Sister 65       smoker  . Ovarian cancer Sister   . Osteopenia Sister   . Hypertrophic cardiomyopathy Sister   . COPD Sister   . Heart failure Sister   . Hypothyroidism Sister   . Diabetes Maternal Grandmother   . Colon cancer Neg Hx   . Esophageal cancer Neg Hx   . Liver disease Neg Hx   . Stomach cancer Neg Hx   . Rectal cancer Neg Hx     Social History   Tobacco Use  . Smoking status: Never Smoker  . Smokeless tobacco: Never Used  Substance Use Topics  . Alcohol use: No    Alcohol/week: 0.0 oz    Comment: socially, once / month  Subjective:  Diagnosed 3 days ago with swimmer's ear left ear; using prescribed drops with limited benefit; requesting different treatment.   Objective:  Vitals:   12/30/17 1415  BP: 132/70  Pulse: 80  Temp: 97.7 F (36.5 C)  TempSrc: Oral  SpO2: 97%  Weight: 174 lb 1.9 oz (79 kg)  Height: 5\' 3"  (1.6 m)    General: Well developed, well nourished, in no acute distress  Skin : Warm and dry.  Head: Normocephalic and atraumatic  Eyes: Sclera and conjunctiva clear; pupils round and reactive to light; extraocular movements intact  Ears: External normal; canals erythematous/ swollen; tympanic membranes normal  Oropharynx: Pink, supple. No suspicious lesions  Neck: Supple without thyromegaly, adenopathy  Lungs:  Respirations unlabored; clear to auscultation bilaterally without wheeze, rales, rhonchi  Neurologic: Alert and oriented; speech intact; face symmetrical; moves all extremities well; CNII-XII intact without focal deficit   Assessment:  1. Acute otitis externa of left ear, unspecified type     Plan:  Rx for Augmentin 875 mg bid x 10 days; okay to use ear drops as steroid component will be beneficial; follow-up worse, no better.    No follow-ups on file.  No orders of the defined types were placed in this encounter.   Requested Prescriptions   Signed Prescriptions Disp Refills  . amoxicillin-clavulanate (AUGMENTIN) 875-125 MG tablet 20 tablet 0    Sig: Take 1 tablet by mouth 2 (two) times daily.
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
## 2018-01-12 ENCOUNTER — Encounter: Payer: Self-pay | Admitting: Internal Medicine

## 2018-01-12 ENCOUNTER — Ambulatory Visit (INDEPENDENT_AMBULATORY_CARE_PROVIDER_SITE_OTHER): Payer: Medicare Other | Admitting: Internal Medicine

## 2018-01-12 VITALS — BP 142/80 | HR 72 | Temp 98.6°F | Resp 16 | Ht 63.0 in | Wt 175.0 lb

## 2018-01-12 DIAGNOSIS — H918X2 Other specified hearing loss, left ear: Secondary | ICD-10-CM | POA: Diagnosis not present

## 2018-01-12 DIAGNOSIS — H9192 Unspecified hearing loss, left ear: Secondary | ICD-10-CM | POA: Insufficient documentation

## 2018-01-12 MED ORDER — PREDNISONE 20 MG PO TABS: 40.0000 mg | ORAL_TABLET | Freq: Every day | ORAL | 0 refills | Status: DC

## 2018-01-12 MED ORDER — FLUTICASONE PROPIONATE 50 MCG/ACT NA SUSP: 2.0000 | Freq: Every day | NASAL | 6 refills | Status: DC
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
## 2018-01-12 NOTE — Patient Instructions (Signed)
Take the prednisone as directed - once a day for five days.  Take with food.   Use the nasal spray once daily.    A referral was ordered for ENT.  They will call you to schedule.

## 2018-01-14 DIAGNOSIS — H902 Conductive hearing loss, unspecified: Secondary | ICD-10-CM | POA: Diagnosis not present

## 2018-01-14 DIAGNOSIS — J301 Allergic rhinitis due to pollen: Secondary | ICD-10-CM | POA: Diagnosis not present

## 2018-01-14 DIAGNOSIS — H7292 Unspecified perforation of tympanic membrane, left ear: Secondary | ICD-10-CM | POA: Diagnosis not present

## 2018-02-05 DIAGNOSIS — Z23 Encounter for immunization: Secondary | ICD-10-CM | POA: Diagnosis not present

## 2018-02-10 DIAGNOSIS — H6522 Chronic serous otitis media, left ear: Secondary | ICD-10-CM | POA: Diagnosis not present

## 2018-02-10 DIAGNOSIS — H6122 Impacted cerumen, left ear: Secondary | ICD-10-CM | POA: Diagnosis not present

## 2018-02-11 DIAGNOSIS — B078 Other viral warts: Secondary | ICD-10-CM | POA: Diagnosis not present

## 2018-02-11 DIAGNOSIS — T148XXA Other injury of unspecified body region, initial encounter: Secondary | ICD-10-CM | POA: Diagnosis not present

## 2018-03-10 DIAGNOSIS — H9313 Tinnitus, bilateral: Secondary | ICD-10-CM | POA: Diagnosis not present

## 2018-03-10 DIAGNOSIS — H6522 Chronic serous otitis media, left ear: Secondary | ICD-10-CM | POA: Diagnosis not present

## 2018-03-10 DIAGNOSIS — H9012 Conductive hearing loss, unilateral, left ear, with unrestricted hearing on the contralateral side: Secondary | ICD-10-CM | POA: Diagnosis not present

## 2018-03-10 DIAGNOSIS — H70002 Acute mastoiditis without complications, left ear: Secondary | ICD-10-CM | POA: Diagnosis not present

## 2018-03-10 DIAGNOSIS — H9042 Sensorineural hearing loss, unilateral, left ear, with unrestricted hearing on the contralateral side: Secondary | ICD-10-CM | POA: Diagnosis not present

## 2018-03-10 DIAGNOSIS — H6692 Otitis media, unspecified, left ear: Secondary | ICD-10-CM | POA: Diagnosis not present

## 2018-03-11 ENCOUNTER — Other Ambulatory Visit (HOSPITAL_COMMUNITY): Payer: Self-pay | Admitting: Otolaryngology

## 2018-03-11 DIAGNOSIS — H9190 Unspecified hearing loss, unspecified ear: Secondary | ICD-10-CM

## 2018-03-12 ENCOUNTER — Other Ambulatory Visit (HOSPITAL_COMMUNITY): Payer: Self-pay | Admitting: Otolaryngology

## 2018-03-12 DIAGNOSIS — H9190 Unspecified hearing loss, unspecified ear: Secondary | ICD-10-CM

## 2018-03-12 DIAGNOSIS — D447 Neoplasm of uncertain behavior of aortic body and other paraganglia: Secondary | ICD-10-CM

## 2018-03-17 DIAGNOSIS — K2930 Chronic superficial gastritis without bleeding: Secondary | ICD-10-CM | POA: Diagnosis not present

## 2018-03-17 DIAGNOSIS — I499 Cardiac arrhythmia, unspecified: Secondary | ICD-10-CM | POA: Diagnosis not present

## 2018-03-17 DIAGNOSIS — K210 Gastro-esophageal reflux disease with esophagitis: Secondary | ICD-10-CM | POA: Diagnosis not present

## 2018-03-17 DIAGNOSIS — K440 Diaphragmatic hernia with obstruction, without gangrene: Secondary | ICD-10-CM | POA: Diagnosis not present

## 2018-03-17 DIAGNOSIS — K222 Esophageal obstruction: Secondary | ICD-10-CM | POA: Diagnosis not present

## 2018-03-17 DIAGNOSIS — K449 Diaphragmatic hernia without obstruction or gangrene: Secondary | ICD-10-CM | POA: Diagnosis not present

## 2018-03-17 DIAGNOSIS — K2270 Barrett's esophagus without dysplasia: Secondary | ICD-10-CM | POA: Diagnosis not present

## 2018-03-20 ENCOUNTER — Other Ambulatory Visit (HOSPITAL_COMMUNITY): Payer: Self-pay | Admitting: Otolaryngology

## 2018-03-20 ENCOUNTER — Ambulatory Visit (HOSPITAL_COMMUNITY)
Admission: RE | Admit: 2018-03-20 | Discharge: 2018-03-20 | Disposition: A | Discharge status: Home / Self Care | Payer: Medicare Other | Source: Ambulatory Visit | Attending: Otolaryngology | Admitting: Otolaryngology

## 2018-03-20 DIAGNOSIS — H9190 Unspecified hearing loss, unspecified ear: Secondary | ICD-10-CM | POA: Diagnosis not present

## 2018-03-20 DIAGNOSIS — D447 Neoplasm of uncertain behavior of aortic body and other paraganglia: Secondary | ICD-10-CM

## 2018-03-20 LAB — POCT I-STAT CREATININE: Creatinine, Ser: 0.8 mg/dL (ref 0.44–1.00)

## 2018-03-20 MED ORDER — IOHEXOL 300 MG/ML  SOLN
75.0000 mL | Freq: Once | INTRAMUSCULAR | Status: AC | PRN
  Administered 2018-03-20: 75 mL via INTRAVENOUS

## 2018-04-06 DIAGNOSIS — H9042 Sensorineural hearing loss, unilateral, left ear, with unrestricted hearing on the contralateral side: Secondary | ICD-10-CM | POA: Diagnosis not present

## 2018-04-07 ENCOUNTER — Encounter: Payer: Self-pay | Admitting: Internal Medicine

## 2018-04-07 NOTE — Telephone Encounter (Signed)
Documented flu shot.../LMB  

## 2018-04-13 IMAGING — CT CT ABD-PELV W/ CM
2 of 5 series · 16 of 46 positions shown, 18 images · IV contrast (ISOVUE 300)
Comparison: 08/02/2014

CLINICAL DATA: Two months of left lower quadrant pain.

EXAM:
CT ABDOMEN AND PELVIS WITH CONTRAST
TECHNIQUE: Multidetector CT imaging of the abdomen and pelvis was performed
using the standard protocol following bolus administration of
intravenous contrast.
CONTRAST:  100mL Q4CG2I-377 IOPAMIDOL (Q4CG2I-377) INJECTION 61%

[Series 2: abd/pel w · axial · 0.76mm/px · z∈[-473,-83]mm · 13 of 88 slices shown, 15 images]
[im 5/88  soft-tissue]
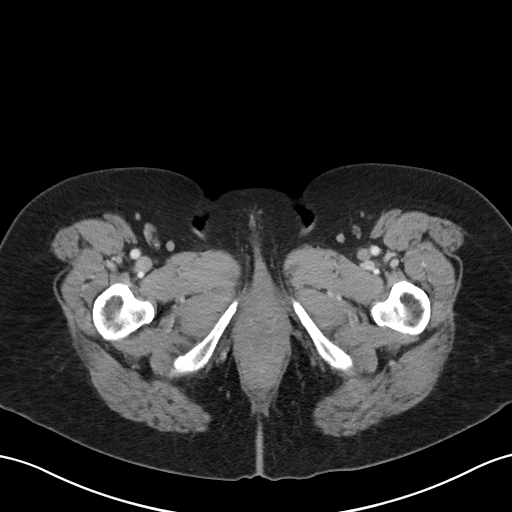
[im 5/88  bone]
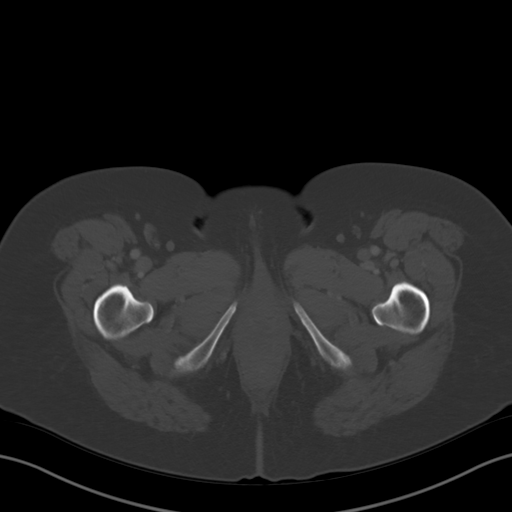
[im 14/88  soft-tissue]
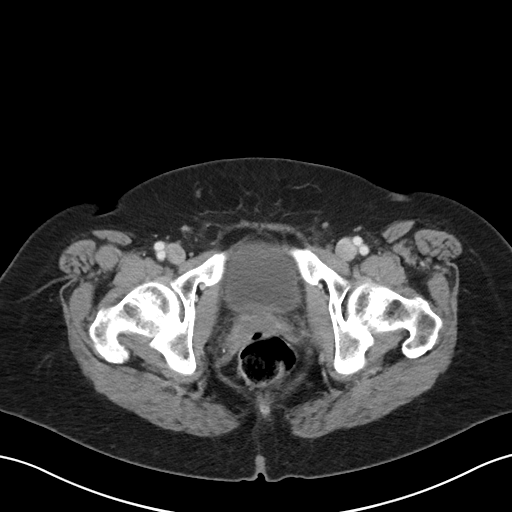
[im 18/88  soft-tissue]
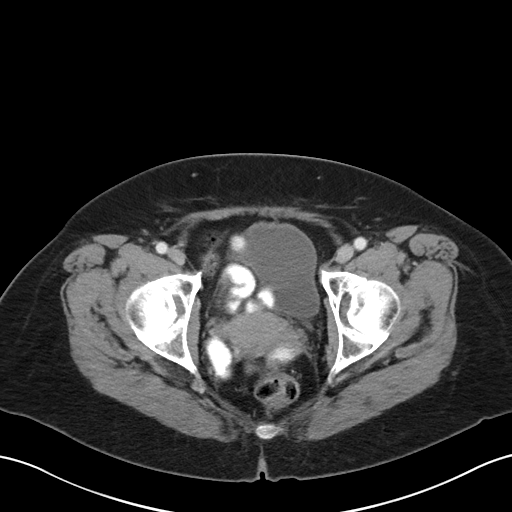
[im 27/88  soft-tissue]
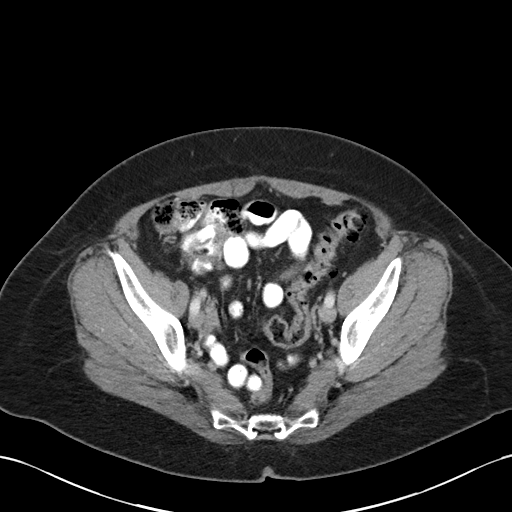
[im 31/88  soft-tissue]
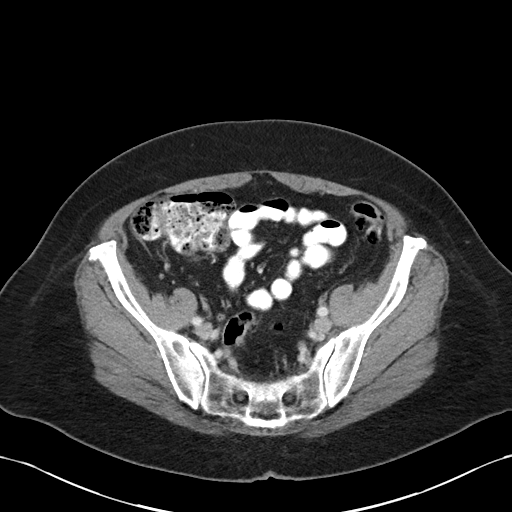
[im 40/88  soft-tissue]
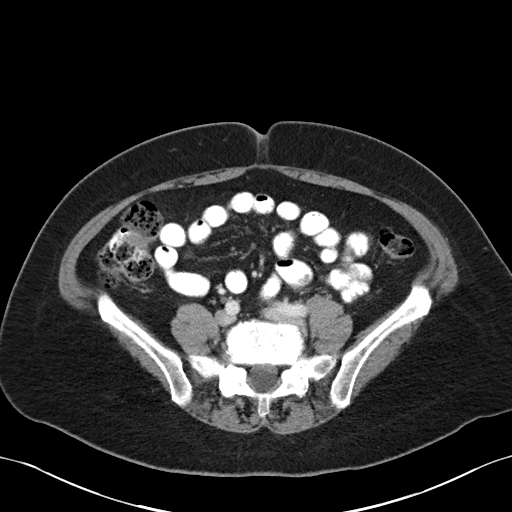
[im 44/88  soft-tissue]
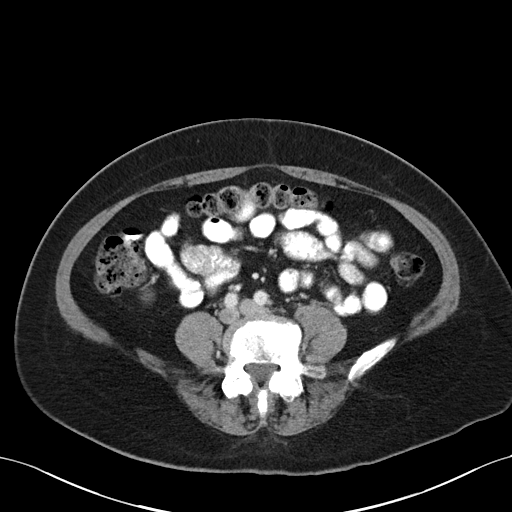
[im 48/88  soft-tissue]
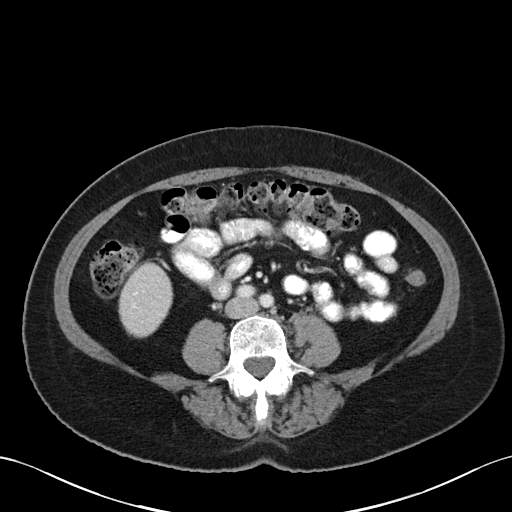
[im 57/88  soft-tissue]
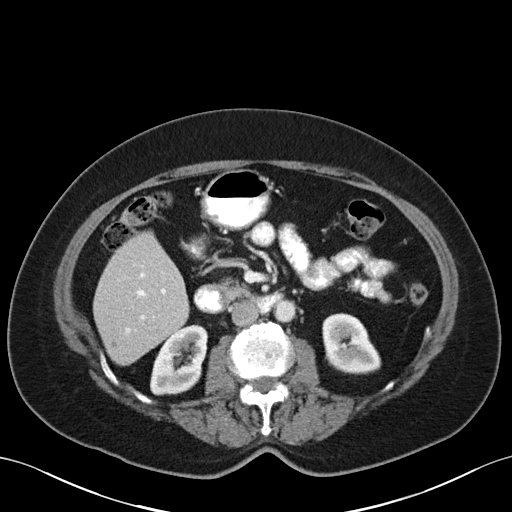
[im 57/88  bone]
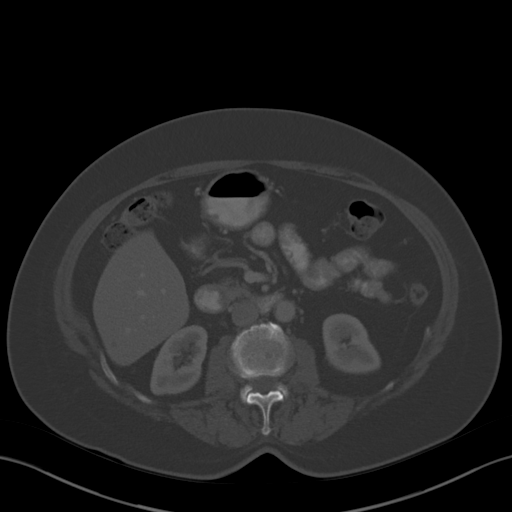
[im 61/88  soft-tissue]
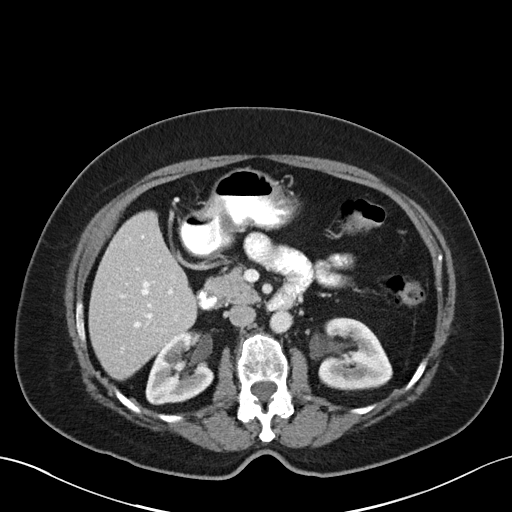
[im 70/88  soft-tissue]
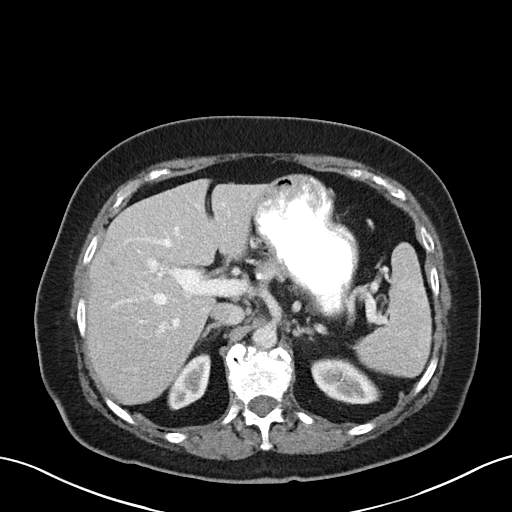
[im 74/88  soft-tissue]
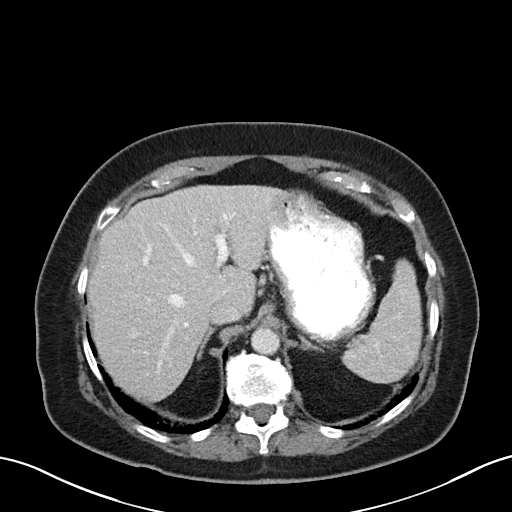
[im 83/88  soft-tissue]
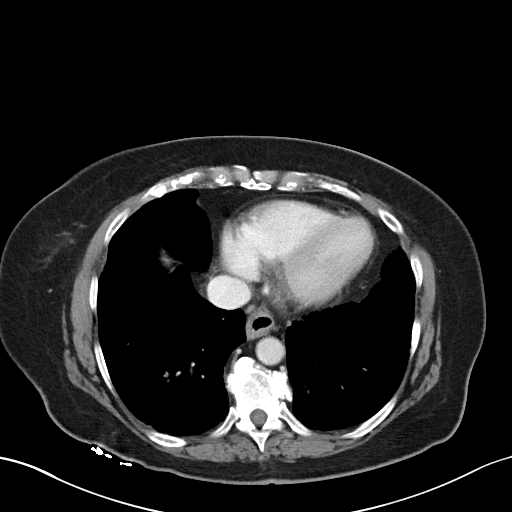

[Series 6: abd/pel w st · coronal · 0.68mm/px · 3 of 81 slices shown]
[im 27/81  soft-tissue]
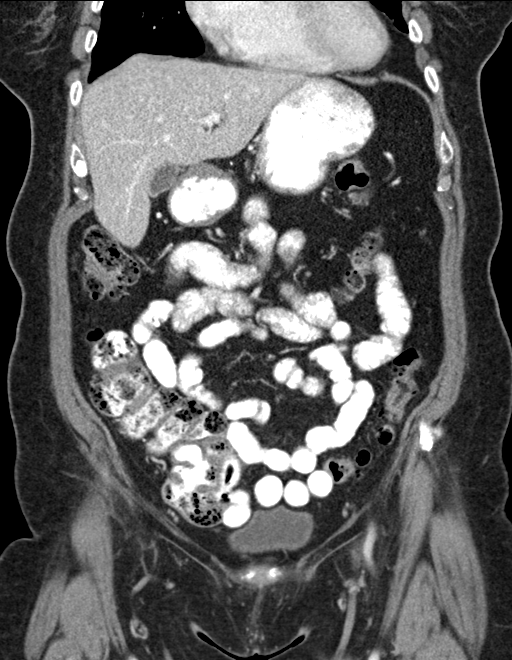
[im 36/81  soft-tissue]
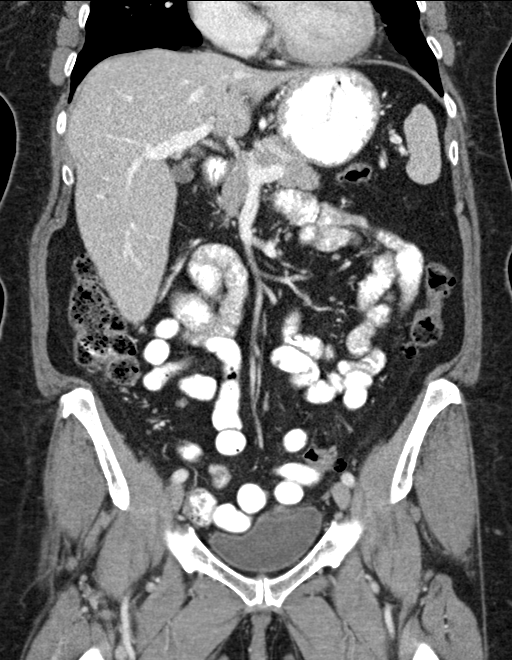
[im 45/81  soft-tissue]
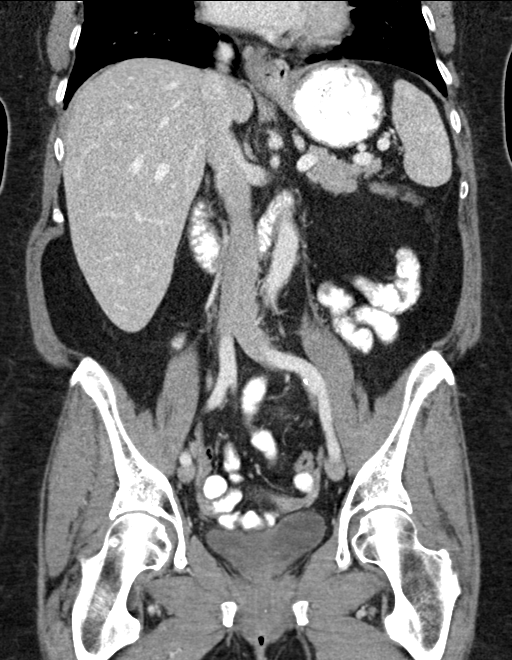

[16 of 46 positions shown; findings below may reference images not displayed]

FINDINGS: Lower chest: Minimal sliding hiatal hernia with 2 neighboring
prominent lymph nodes that are stable from 8019. Stable, benign
small subpleural lymph node on the left in the lower lobe.

Hepatobiliary: 2- 3 mm low-density in segment 6, much too small for
characterization.No evidence of biliary obstruction or stone.

Pancreas: Unremarkable.

Spleen: Unremarkable.

Adrenals/Urinary Tract: Negative adrenals. No hydronephrosis or
stone. 1 cm cortical cyst on the right. Small bilateral renal sinus
cysts. Unremarkable bladder.

Stomach/Bowel: Colonic diverticulosis, fairly extensive at the
sigmoid. No appendicitis.

Vascular/Lymphatic: No acute vascular abnormality. Mild scattered
atherosclerotic calcifications. No mass or adenopathy.

Reproductive:Negative

Other: No ascites or pneumoperitoneum.

Musculoskeletal: No acute abnormalities. Generalized spondylosis and
degenerative disc narrowing. Lower lumbar facet arthropathy,
advanced at L4-5 where there is anterolisthesis and left foraminal
impingement.
IMPRESSION: No acute finding or specific explanation pain.

Colonic diverticulosis, greatest at the sigmoid, without active
inflammation.

Lumbar disc and facet degeneration with L4-5 anterolisthesis.

## 2018-04-21 DIAGNOSIS — H25043 Posterior subcapsular polar age-related cataract, bilateral: Secondary | ICD-10-CM | POA: Diagnosis not present

## 2018-04-21 DIAGNOSIS — H3589 Other specified retinal disorders: Secondary | ICD-10-CM | POA: Diagnosis not present

## 2018-04-21 DIAGNOSIS — D3131 Benign neoplasm of right choroid: Secondary | ICD-10-CM | POA: Diagnosis not present

## 2018-04-21 DIAGNOSIS — H25013 Cortical age-related cataract, bilateral: Secondary | ICD-10-CM | POA: Diagnosis not present

## 2018-04-21 DIAGNOSIS — H2513 Age-related nuclear cataract, bilateral: Secondary | ICD-10-CM | POA: Diagnosis not present

## 2018-08-12 ENCOUNTER — Encounter: Payer: Self-pay | Admitting: Internal Medicine

## 2018-10-05 ENCOUNTER — Other Ambulatory Visit: Payer: Self-pay | Admitting: Obstetrics & Gynecology

## 2018-10-05 DIAGNOSIS — Z1231 Encounter for screening mammogram for malignant neoplasm of breast: Secondary | ICD-10-CM

## 2018-10-26 ENCOUNTER — Telehealth: Payer: Self-pay | Admitting: Internal Medicine

## 2018-10-26 MED ORDER — METRONIDAZOLE 250 MG PO TABS: 250.0000 mg | ORAL_TABLET | Freq: Three times a day (TID) | ORAL | 0 refills | Status: DC

## 2018-10-26 MED ORDER — CIPROFLOXACIN HCL 500 MG PO TABS: 500.0000 mg | ORAL_TABLET | Freq: Two times a day (BID) | ORAL | 0 refills | Status: DC

## 2018-10-26 NOTE — Telephone Encounter (Signed)
Pt reports she had 2 episodes where she thought she had diverticulitis in May, reports she went on clear liquid diet and took bentyl but when she started eating again the pain came back. Pt reports she is having LLQ pain again but no fever. Pt states she has not taken any antibiotics and wants to know what she should do now. Please advise.

## 2018-10-26 NOTE — Telephone Encounter (Signed)
Pt requested a call back to discuss diverticulosis flare up.

## 2018-10-26 NOTE — Telephone Encounter (Signed)
Ok to try abx to see if pain resolves, if not she needs to let me know Cipro 500 mg bid x 7 days metronidazole 250 mg x 7 days Office visit next available, in person or virtual

## 2018-10-26 NOTE — Telephone Encounter (Signed)
Spoke with pt and she is aware, scripts sent to pharmacy. Pt scheduled to see Dr. Hilarie Fredrickson 11/11/18@11 :30am. Pt aware.

## 2018-11-10 ENCOUNTER — Telehealth: Payer: Self-pay | Admitting: *Deleted

## 2018-11-10 NOTE — Telephone Encounter (Signed)
Covid-19 screening questions   Do you now or have you had a fever in the last 14 days? NO   Do you have any respiratory symptoms of shortness of breath or cough now or in the last 14 days? NO  Do you have any family members or close contacts with diagnosed or suspected Covid-19 in the past 14 days? NO  Have you been tested for Covid-19 and found to be positive? NO        

## 2018-11-11 ENCOUNTER — Ambulatory Visit (INDEPENDENT_AMBULATORY_CARE_PROVIDER_SITE_OTHER): Payer: Medicare Other | Admitting: Internal Medicine

## 2018-11-11 ENCOUNTER — Encounter: Payer: Self-pay | Admitting: Internal Medicine

## 2018-11-11 VITALS — BP 136/84 | HR 83 | Temp 98.4°F | Ht 63.0 in | Wt 179.1 lb

## 2018-11-11 DIAGNOSIS — Z8719 Personal history of other diseases of the digestive system: Secondary | ICD-10-CM

## 2018-11-11 DIAGNOSIS — K5010 Crohn's disease of large intestine without complications: Secondary | ICD-10-CM

## 2018-11-11 DIAGNOSIS — Z86010 Personal history of colonic polyps: Secondary | ICD-10-CM

## 2018-11-11 DIAGNOSIS — K5730 Diverticulosis of large intestine without perforation or abscess without bleeding: Secondary | ICD-10-CM

## 2018-11-11 MED ORDER — MESALAMINE 1.2 G PO TBEC: 2.4000 g | DELAYED_RELEASE_TABLET | Freq: Every day | ORAL | 0 refills | Status: DC

## 2018-11-11 MED ORDER — DICYCLOMINE HCL 10 MG PO CAPS: ORAL_CAPSULE | ORAL | 1 refills | Status: DC

## 2018-11-11 NOTE — Progress Notes (Signed)
Subjective:    Patient ID: Toni [name], female    DOB: 07/11/1945, 73 y.o.   MRN: [id number]  HPI Toni [name] is a 73 year old female with a history of diverticulosis and diverticulitis, GERD, history of Barrett's esophagus with low-grade dysplasia followed by Dr. 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  She was last seen by Nicoletta Ba, PA-C on 01/10/2017.  She contacted our office on 10/26/2018 to report left lower quadrant abdominal pain radiating across her abdomen reminiscent of prior diverticulitis.  We treated her with Cipro 500 mg twice daily and metronidazole 250 mg 3 times daily x7 days which she completed.  She reports that the pain is definitively better but she continues to have a dull left lower quadrant abdominal pain.  This is worse after eating.  There is some associated gas and bloating.  It is also worse in the morning when her bladder is full.  When she empties her bladder the symptoms improve.  Pain is better when walking around and is more noticeable when she is sitting and being still.  Bowel movements have been normal and occurring daily.  No blood in her stool or melena.  Pain does improve after bowel movement as well.  No fevers or chills.  No upper GI complaint.  She has been switched to pantoprazole 40 mg a day per Dr. Adria Devon.  She has occasionally some solid food dysphasia to dry chicken only.  She had an esophageal dilation in October 2019 when Dr. Adria Devon did surveillance for her Barrett's esophagus.  The dilation has helped.  Her plan is to follow-up with Dr. Adria Devon for repeat upper endoscopy in October 2019.  She is aware of her recall colonoscopy here in May 2021.  Review of Systems As per HPI, otherwise negative  Current Medications, Allergies, Past Medical History, Past Surgical History, Family History and Social History were reviewed in [facility] record.     Objective:   Physical Exam BP 136/84   Pulse 83   Temp 98.4 F (36.9 C)   Ht 5\' 3"  (1.6 m)   Wt 179 lb 2 oz (81.3 kg)   LMP 05/27/2006   BMI 31.73 kg/m   Gen: awake, alert, NAD HEENT: anicteric, op clear CV: RRR, no mrg Pulm: CTA b/l Abd: soft, NT/ND, +BS throughout Ext: no c/c/e Neuro: nonfocal  CBC    Component Value Date/Time   WBC 5.9 12/05/2017 1051   RBC 4.91 12/05/2017 1051   HGB 14.6 12/05/2017 1051   HCT 41.9 12/05/2017 1051   PLT 240.0 12/05/2017 1051   MCV 85.2 12/05/2017 1051   MCH 29.0 06/08/2013 0600   MCHC 34.8 12/05/2017 1051   RDW 13.4 12/05/2017 1051   LYMPHSABS 1.8 12/05/2017 1051   MONOABS 0.3 12/05/2017 1051   EOSABS 0.0 12/05/2017 1051   BASOSABS 0.0 12/05/2017 1051   CMP     Component Value Date/Time   NA 142 12/05/2017 1051   K 4.1 12/05/2017 1051   CL 105 12/05/2017 1051   CO2 29 12/05/2017 1051   GLUCOSE 96 12/05/2017 1051   BUN 18 12/05/2017 1051   CREATININE 0.80 03/20/2018 1545   CALCIUM 9.4 12/05/2017 1051   PROT 7.2 12/05/2017 1051   ALBUMIN 4.3 12/05/2017 1051   AST 13 12/05/2017 1051   ALT 14 12/05/2017 1051   ALKPHOS 86 12/05/2017 1051   BILITOT 0.7  12/05/2017 1051   GFRNONAA 89 (L) 06/08/2013 0600   GFRAA >90 06/08/2013 0600          Assessment & Plan:  73 year old female with a history of diverticulosis and diverticulitis, GERD, history of Barrett's esophagus with low-grade dysplasia followed by Dr. 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  1.  Left lower quadrant discomfort/recent diverticulitis --her exam is benign today and symptoms have definitively improved after 7 days of antibiotic therapy.  I do not think that she has diverticulitis at present though she is still having some left lower quadrant discomfort which may be more likely due to a segmental colitis related to her  diverticular disease.  I recommended that we treat as follows: --Lialda 2.4 g daily for 2 to 4 weeks and then discontinue therapy --She can use Bentyl 10 mg every 6 hours as needed for crampy left lower quadrant discomfort --She is asked to notify me if symptoms worsen and otherwise in 4 weeks I asked that she call me to notify me how she responded to the [place] --If symptoms persist then consider repeat imaging and if unremarkable then repeat colonoscopy  2. Hx of colon polyps --surveillance colonoscopy recommended May 2021  3.  History of Barrett's esophagus --status post radiofrequency ablation with resolution of Barrett's.  On surveillance protocol with Dr. Adria Devon at [facility] with next endoscopy in October 2020.  25 minutes spent with the patient today. Greater than 50% was spent in counseling and coordination of care with the patient

## 2018-11-11 NOTE — Patient Instructions (Signed)
We have sent the following medications to your pharmacy for you to pick up at your convenience: Lialda 2.4 grams daily x 1 month Bentyl 10 mg every 6 hours as needed  Call our office in 1 month with an update on how you are feeling (mychart will be fine too); call sooner if needed.  If you are age 71 or older, your body mass index should be between 23-30. Your Body mass index is 31.73 kg/m. If this is out of the aforementioned range listed, please consider follow up with your Primary Care Provider.  If you are age 58 or younger, your body mass index should be between 19-25. Your Body mass index is 31.73 kg/m. If this is out of the aformentioned range listed, please consider follow up with your Primary Care Provider.

## 2018-11-12 ENCOUNTER — Other Ambulatory Visit: Payer: Self-pay

## 2018-11-12 MED ORDER — SULFASALAZINE 500 MG PO TABS: 1000.0000 mg | ORAL_TABLET | Freq: Two times a day (BID) | ORAL | 0 refills | Status: DC

## 2018-11-12 MED ORDER — FOLIC ACID 1 MG PO TABS: 1.0000 mg | ORAL_TABLET | Freq: Every day | ORAL | 0 refills | Status: DC

## 2018-11-12 NOTE — Telephone Encounter (Signed)
Okay to try sulfasalazine 1 g twice daily for the next 2 to 4 weeks Add folic acid 1 mg daily while on this medication

## 2018-12-05 ENCOUNTER — Other Ambulatory Visit: Payer: Self-pay | Admitting: Internal Medicine

## 2018-12-20 ENCOUNTER — Other Ambulatory Visit: Payer: Self-pay | Admitting: Internal Medicine

## 2018-12-23 ENCOUNTER — Ambulatory Visit (INDEPENDENT_AMBULATORY_CARE_PROVIDER_SITE_OTHER): Payer: Medicare Other | Admitting: *Deleted

## 2018-12-23 DIAGNOSIS — Z0000 Encounter for general adult medical examination without abnormal findings: Secondary | ICD-10-CM

## 2018-12-23 NOTE — Progress Notes (Addendum)
Subjective:   Toni [name] is a 73 y.o. female who presents for Medicare Annual (Subsequent) preventive examination. I connected with patient by a telephone and verified that I am speaking with the correct person using two identifiers. Patient stated full name and DOB. Patient gave permission to continue with telephonic visit. Patient's location was at home and Nurse's location was at [place] office.   Review of Systems:   Cardiac Risk Factors include: advanced age (>58men, >85 women);dyslipidemia Sleep patterns: feels rested on waking, gets up 0-1 times nightly to void and sleeps 7-8 hours nightly.    Home Safety/Smoke Alarms: Feels safe in home. Smoke alarms in place.  Living environment; residence and Firearm Safety: 2-story house, no firearms, firearms stored safely. Lives with husband, no needs for DME, good support system Seat Belt Safety/Bike Helmet: Wears seat belt.     Objective:     Vitals: LMP 05/27/2006   There is no height or weight on file to calculate BMI.  Advanced Directives 12/23/2018 11/04/2017 06/14/2016 02/13/2016 10/14/2014 06/08/2013  Does Patient Have a Medical Advance Directive? Yes Yes No Yes Yes Patient has advance directive, copy not in chart  Type of Advance Directive [place];Living will [place];Living will - [place];Living will [place] in Chart? No - copy requested No - copy requested - No - copy requested - -    Tobacco Social History   Tobacco Use  Smoking Status Never Smoker  Smokeless Tobacco Never Used     Counseling given: Not Answered  Past Medical History:  Diagnosis Date  . Aortic sclerosis 2000   on ECHO  . Colon polyps    Dr Sharlett Iles  . Diverticulosis of colon (without mention of hemorrhage)   . Esophageal reflux   . Eye abnormality    OD ocular freckle  . H/O hiatal hernia   . Hyperlipidemia 1999   LDL  158, resolved with diet  . Premature ventricular contractions   . Stricture and stenosis of esophagus    dilation X 3  . SVT (supraventricular tachycardia) (HCC)    slow pathway ablated   Past Surgical History:  Procedure Laterality Date  . ANKLE SURGERY     right  . COLONOSCOPY    . COLONOSCOPY W/ POLYPECTOMY  2011   Dr Sharlett Iles  . KNEE ARTHROSCOPY     bilateral  . ovarian wedge resection     to allow pregnancy  . SUPRAVENTRICULAR TACHYCARDIA ABLATION N/A 06/08/2013   Procedure: SUPRAVENTRICULAR TACHYCARDIA ABLATION;  Surgeon: Coralyn Mark, MD;  Location: [facility] CATH LAB;  Service: Cardiovascular;  Laterality: N/A;  . TONSILLECTOMY     Family History  Problem Relation Age of Onset  . Stroke Father 58  . Arthritis Mother        OA  . Hypertension Mother   . Hypothyroidism Mother   . Stroke Mother 95       ischemic  . Heart attack Sister 36       smoker  . Ovarian cancer Sister   . Osteopenia Sister   . Hypertrophic cardiomyopathy Sister   . COPD Sister   . Heart failure Sister   . Hypothyroidism Sister   . Cardiomyopathy Sister   . Emphysema Sister   . Diabetes Maternal Grandmother   . Colon cancer Neg Hx   . Esophageal cancer Neg Hx   . Liver disease Neg Hx   .  Stomach cancer Neg Hx   . Rectal cancer Neg Hx    Social History   Socioeconomic History  . Marital status: Married    Spouse name: Not on file  . Number of children: 2  . Years of education: Not on file  . Highest education level: Not on file  Occupational History  . Occupation: Producer, 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[employer]  . Financial resource strain: Not hard at all  . Food insecurity    Worry: Never true    Inability: Never true  . Transportation needs    Medical: No    Non-medical: No  Tobacco Use  . Smoking status: Never Smoker  . Smokeless tobacco: Never Used  Substance and Sexual Activity  . Alcohol use: Yes    Alcohol/week: 0.0 standard drinks    Comment: socially, once  / month  . Drug use: No  . Sexual activity: Yes    Birth control/protection: Post-menopausal  Lifestyle  . Physical activity    Days per week: 4 days    Minutes per session: 50 min  . Stress: Not at all  Relationships  . Social connections    Talks on phone: More than three times a week    Gets together: More than three times a week    Attends religious service: More than 4 times per year    Active member of club or organization: Yes    Attends meetings of clubs or organizations: More than 4 times per year    Relationship status: Married  Other Topics Concern  . Not on file  Social History Narrative  . Not on file    Outpatient Encounter Medications as of 12/23/2018  Medication Sig  . ASPIRIN 81 PO Take 1 tablet by mouth daily.  . Cholecalciferol (VITAMIN D3 PO) Take 1,000 Units by mouth daily.  Marland Kitchen dicyclomine (BENTYL) 10 MG capsule Take 1 tab by mouth every 6 hours as needed for cramping.  . fluticasone (FLONASE) 50 MCG/ACT nasal spray Place 2 sprays into both nostrils daily. (Patient taking differently: Place 2 sprays into both nostrils as needed. )  . pantoprazole (PROTONIX) 40 MG tablet Take 40 mg by mouth daily.  . Probiotic Product (PROBIOTIC PO) Take 1 tablet by mouth daily.  . vitamin B-12 (CYANOCOBALAMIN) 1000 MCG tablet Take 1,000 mcg by mouth daily.  . [DISCONTINUED] Aspirin-Acetaminophen-Caffeine (EXCEDRIN PO) Take 1 tablet by mouth as needed.  . [DISCONTINUED] folic acid (FOLVITE) 1 MG tablet TAKE 1 TABLET BY MOUTH EVERY DAY  . [DISCONTINUED] folic acid (FOLVITE) 1 MG tablet TAKE 1 TABLET BY MOUTH EVERY DAY (Patient not taking: Reported on 12/23/2018)  . [DISCONTINUED] mesalamine (LIALDA) 1.2 g EC tablet Take 2 tablets (2.4 g total) by mouth daily with breakfast. (Patient not taking: Reported on 12/23/2018)  . [DISCONTINUED] sulfaSALAzine (AZULFIDINE) 500 MG tablet TAKE 2 TABLETS BY MOUTH TWICE A DAY  . [DISCONTINUED] sulfaSALAzine (AZULFIDINE) 500 MG tablet TAKE 2  TABLETS BY MOUTH TWICE A DAY (Patient not taking: Reported on 12/23/2018)   No facility-administered encounter medications on file as of 12/23/2018.     Activities of Daily Living In your present state of health, do you have any difficulty performing the following activities: 12/23/2018  Hearing? N  Vision? N  Difficulty concentrating or making decisions? N  Walking or climbing stairs? N  Dressing or bathing? N  Doing errands, shopping? N  Preparing Food and eating ? N  Using the Toilet? N  In the  past six months, have you accidently leaked urine? N  Do you have problems with loss of bowel control? N  Managing your Medications? N  Managing your Finances? N  Housekeeping or managing your Housekeeping? N  Some recent data might be hidden    Patient Care Team: Binnie Rail, MD as PCP - General (Internal Medicine) Thompson Grayer, MD as PCP - Cardiology (Cardiology)    Assessment:   This is a routine wellness examination for [place]. Physical assessment deferred to PCP.  Exercise Activities and Dietary recommendations Current Exercise Habits: The patient does not participate in regular exercise at present([employer] seniors exercise TV program information provided), Exercise limited by: orthopedic condition(s) Diet (meal preparation, eat out, water intake, caffeinated beverages, dairy products, fruits and vegetables): in general, a "healthy" diet  , well balanced . eats a variety of fruits and vegetables daily, limits salt, fat/cholesterol, sugar,carbohydrates,caffeine, drinks 6-8 glasses of water daily.   Goals    . Patient Stated     I want to lose weight by eating healthy and exercise. I want to enjoy life, family and travel.       Fall Risk Fall Risk  12/23/2018 11/04/2017 01/19/2016  Falls in the past year? 0 No No  Comment - - Emmi Telephone Survey: data to providers prior to load  Number falls in past yr: 0 - -   Depression Screen PHQ 2/9 Scores 12/23/2018 11/04/2017  PHQ - 2  Score 0 0  PHQ- 9 Score - 0     Cognitive Function       Ad8 score reviewed for issues:  Issues making decisions: no  Less interest in hobbies / activities: no  Repeats questions, stories (family complaining): no  Trouble using ordinary gadgets (microwave, computer, phone):no  Forgets the month or year: no  Mismanaging finances: no  Remembering appts: no  Daily problems with thinking and/or memory: no Ad8 score is= 0    Immunization History  Administered Date(s) Administered  . Influenza, High Dose Seasonal PF 02/21/2017, 02/06/2018  . Influenza,inj,Quad PF,6+ Mos 02/24/2013, 02/07/2015  . Influenza-Unspecified 02/10/2014  . Pneumococcal Conjugate-13 02/16/2014  . Pneumococcal Polysaccharide-23 06/08/2015  . Td 05/27/2001  . Tdap 06/27/2010, 02/26/2011  . Zoster 04/12/2010   Screening Tests Health Maintenance  Topic Date Due  . DEXA SCAN  05/07/2018  . INFLUENZA VACCINE  12/26/2018  . MAMMOGRAM  09/09/2019  . COLONOSCOPY  10/14/2019  . TETANUS/TDAP  02/25/2021  . Hepatitis C Screening  Completed  . PNA vac Low Risk Adult  Completed      Plan:    Reviewed health maintenance screenings with patient today and relevant education, vaccines, and/or referrals were provided.   Continue to eat heart healthy diet (full of fruits, vegetables, whole grains, lean protein, water--limit salt, fat, and sugar intake) and increase physical activity as tolerated.  Continue doing brain stimulating activities (puzzles, reading, adult coloring books, staying active) to keep memory sharp.   I have personally reviewed and noted the following in the patient's chart:   . Medical and social history . Use of alcohol, tobacco or illicit drugs  . Current medications and supplements . Functional ability and status . Nutritional status . Physical activity . Advanced directives . List of other physicians . Screenings to include cognitive, depression, and falls . Referrals and  appointments  In addition, I have reviewed and discussed with patient certain preventive protocols, quality metrics, and best practice recommendations. A written personalized care plan for preventive services as well  as general preventive health recommendations were provided to patient.     Michiel Cowboy, RN  12/23/2018    Medical screening examination/treatment/procedure(s) were performed by non-physician practitioner and as supervising physician I was immediately available for consultation/collaboration. I agree with above. Binnie Rail, MD

## 2019-01-04 ENCOUNTER — Telehealth: Payer: Self-pay

## 2019-01-04 ENCOUNTER — Ambulatory Visit (INDEPENDENT_AMBULATORY_CARE_PROVIDER_SITE_OTHER)
Admission: RE | Admit: 2019-01-04 | Discharge: 2019-01-04 | Disposition: A | Discharge status: Home / Self Care | Payer: Medicare Other | Source: Ambulatory Visit | Attending: Internal Medicine | Admitting: Internal Medicine

## 2019-01-04 ENCOUNTER — Other Ambulatory Visit: Payer: Self-pay

## 2019-01-04 DIAGNOSIS — M85852 Other specified disorders of bone density and structure, left thigh: Secondary | ICD-10-CM

## 2019-01-04 NOTE — Telephone Encounter (Signed)
Please put in bone density for pt. Appointment at 34 today. Thanks.

## 2019-01-05 ENCOUNTER — Ambulatory Visit
Admission: RE | Admit: 2019-01-05 | Discharge: 2019-01-05 | Disposition: A | Discharge status: Home / Self Care | Payer: Medicare Other | Source: Ambulatory Visit | Attending: Obstetrics & Gynecology | Admitting: Obstetrics & Gynecology

## 2019-01-05 ENCOUNTER — Other Ambulatory Visit: Payer: Self-pay

## 2019-01-05 DIAGNOSIS — Z1231 Encounter for screening mammogram for malignant neoplasm of breast: Secondary | ICD-10-CM

## 2019-01-06 ENCOUNTER — Other Ambulatory Visit: Payer: Self-pay

## 2019-01-07 ENCOUNTER — Ambulatory Visit (INDEPENDENT_AMBULATORY_CARE_PROVIDER_SITE_OTHER): Payer: Medicare Other | Admitting: Obstetrics & Gynecology

## 2019-01-07 ENCOUNTER — Encounter: Payer: Self-pay | Admitting: Obstetrics & Gynecology

## 2019-01-07 VITALS — BP 144/90 | Ht 62.5 in | Wt 177.0 lb

## 2019-01-07 DIAGNOSIS — Z124 Encounter for screening for malignant neoplasm of cervix: Secondary | ICD-10-CM

## 2019-01-07 DIAGNOSIS — M8588 Other specified disorders of bone density and structure, other site: Secondary | ICD-10-CM

## 2019-01-07 DIAGNOSIS — M79621 Pain in right upper arm: Secondary | ICD-10-CM

## 2019-01-07 DIAGNOSIS — Z01419 Encounter for gynecological examination (general) (routine) without abnormal findings: Secondary | ICD-10-CM

## 2019-01-07 DIAGNOSIS — Z780 Asymptomatic menopausal state: Secondary | ICD-10-CM

## 2019-01-07 DIAGNOSIS — M85852 Other specified disorders of bone density and structure, left thigh: Secondary | ICD-10-CM

## 2019-01-07 NOTE — Progress Notes (Signed)
Toni [name] 1946-02-07 [id number]   History:    73 y.o. G2P2L2 Married.  RP:  Established patient presenting for annual gyn exam   HPI: Menoapause, well on no HRT.  No PMB.  No pelvic pain.  Breasts with no lump.  Left breast normal.  Right breast with mild tenderness.  Feels swollen at the right armpit.  No nipple discharge.  Urine and bowel movements normal.  Health labs with family physician.  Past medical history,surgical history, family history and social history were all reviewed and documented in the EPIC chart.  Gynecologic History Patient's last menstrual period was 05/27/2006. Contraception: post menopausal status Last Pap: 2017. Results were: normal Last mammogram: 12/2018. Results were: Negative Bone Density: 12/2018 Osteopenia Lt Femoral Neck -2.0 Colonoscopy: 09/2014  Obstetric History OB History  Gravida Para Term Preterm AB Living  2 2       2   SAB TAB Ectopic Multiple Live Births               # Outcome Date GA Lbr Len/2nd Weight Sex Delivery Anes PTL Lv  2 Para           1 Para              ROS: A ROS was performed and pertinent positives and negatives are included in the history.  GENERAL: No fevers or chills. HEENT: No change in vision, no earache, sore throat or sinus congestion. NECK: No pain or stiffness. CARDIOVASCULAR: No chest pain or pressure. No palpitations. PULMONARY: No shortness of breath, cough or wheeze. GASTROINTESTINAL: No abdominal pain, nausea, vomiting or diarrhea, melena or bright red blood per rectum. GENITOURINARY: No urinary frequency, urgency, hesitancy or dysuria. MUSCULOSKELETAL: No joint or muscle pain, no back pain, no recent trauma. DERMATOLOGIC: No rash, no itching, no lesions. ENDOCRINE: No polyuria, polydipsia, no heat or cold intolerance. No recent change in weight. HEMATOLOGICAL: No anemia or easy bruising or bleeding. NEUROLOGIC: No headache, seizures, numbness, tingling or weakness. PSYCHIATRIC: No depression, no loss  of interest in normal activity or change in sleep pattern.     Exam:   BP (!) 160/92   LMP 05/27/2006   There is no height or weight on file to calculate BMI.  General appearance : Well developed well nourished female. No acute distress HEENT: Eyes: no retinal hemorrhage or exudates,  Neck supple, trachea midline, no carotid bruits, no thyroidmegaly Lungs: Clear to auscultation, no rhonchi or wheezes, or rib retractions  Heart: Regular rate and rhythm, no murmurs or gallops Breast:Examined in sitting and supine position were symmetrical in appearance, no palpable masses or tenderness,  no skin retraction, no nipple inversion, no nipple discharge, no skin discoloration, no axillary or supraclavicular lymphadenopathy Abdomen: no palpable masses or tenderness, no rebound or guarding Extremities: no edema or skin discoloration or tenderness  Pelvic: Vulva: Normal             Vagina: No gross lesions or discharge  Cervix: No gross lesions or discharge.  Pap reflex done.  Uterus  AV, normal size, shape and consistency, non-tender and mobile  Adnexa  Without masses or tenderness  Anus: Normal   Assessment/Plan:  73 y.o. female for annual exam   1. Encounter for routine gynecological examination with Papanicolaou smear of cervix Normal gynecologic exam in menopause.  Pap reflex done.  Breast exam normal.  Colonoscopy in 2016.  Health labs with family physician.  2. Postmenopause Post menopause, well on no hormone replacement therapy.  No postmenopausal bleeding.  3. Axillary tenderness, right Right axillary tenderness, but bilateral breast exam and axillae normal.  Recent screening mammogram on January 05, 2019 was negative.  Patient reassured.  4. Osteopenia of neck of left femur Osteopenia with  5. Screening for malignant neoplasm of cervix - Pap IG w/ reflex to HPV when ASC-U  Counseling on above issues and coordination of care more than 50% for 15 minutes.  Princess Bruins  MD, 10:39 AM 01/07/2019

## 2019-01-08 LAB — PAP IG W/ RFLX HPV ASCU

## 2019-01-10 NOTE — Patient Instructions (Signed)
1. Encounter for routine gynecological examination with Papanicolaou smear of cervix Normal gynecologic exam in menopause.  Pap reflex done.  Breast exam normal.  Colonoscopy in 2016.  Health labs with family physician.  2. Postmenopause Post menopause, well on no hormone replacement therapy.  No postmenopausal bleeding.  3. Axillary tenderness, right Right axillary tenderness, but bilateral breast exam and axillae normal.  Recent screening mammogram on January 05, 2019 was negative.  Patient reassured.  4. Osteopenia of neck of left femur Osteopenia with  5. Screening for malignant neoplasm of cervix - Pap IG w/ reflex to HPV when ASC-U  Toni [name], it was a pleasure seeing you today!  I will inform you of your results as soon as they are available.

## 2019-01-11 ENCOUNTER — Encounter: Payer: Self-pay | Admitting: *Deleted

## 2019-01-11 ENCOUNTER — Encounter: Payer: Self-pay | Admitting: Internal Medicine

## 2019-01-11 DIAGNOSIS — Z23 Encounter for immunization: Secondary | ICD-10-CM | POA: Diagnosis not present

## 2019-06-24 ENCOUNTER — Ambulatory Visit: Payer: Medicare Other

## 2019-07-01 ENCOUNTER — Ambulatory Visit: Payer: Medicare Other

## 2019-07-15 ENCOUNTER — Ambulatory Visit: Payer: Medicare Other

## 2019-07-16 ENCOUNTER — Encounter: Payer: Self-pay | Admitting: Internal Medicine

## 2019-07-26 DIAGNOSIS — Z1379 Encounter for other screening for genetic and chromosomal anomalies: Secondary | ICD-10-CM | POA: Diagnosis not present

## 2019-08-03 DIAGNOSIS — Z20828 Contact with and (suspected) exposure to other viral communicable diseases: Secondary | ICD-10-CM | POA: Diagnosis not present

## 2019-08-06 DIAGNOSIS — Z13810 Encounter for screening for upper gastrointestinal disorder: Secondary | ICD-10-CM | POA: Diagnosis not present

## 2019-08-06 DIAGNOSIS — K317 Polyp of stomach and duodenum: Secondary | ICD-10-CM | POA: Diagnosis not present

## 2019-08-06 DIAGNOSIS — K224 Dyskinesia of esophagus: Secondary | ICD-10-CM | POA: Diagnosis not present

## 2019-08-06 DIAGNOSIS — K449 Diaphragmatic hernia without obstruction or gangrene: Secondary | ICD-10-CM | POA: Diagnosis not present

## 2019-08-06 DIAGNOSIS — K222 Esophageal obstruction: Secondary | ICD-10-CM | POA: Diagnosis not present

## 2019-08-06 DIAGNOSIS — K2270 Barrett's esophagus without dysplasia: Secondary | ICD-10-CM | POA: Diagnosis not present

## 2019-09-27 DIAGNOSIS — M1712 Unilateral primary osteoarthritis, left knee: Secondary | ICD-10-CM | POA: Diagnosis not present

## 2019-09-27 DIAGNOSIS — M1711 Unilateral primary osteoarthritis, right knee: Secondary | ICD-10-CM | POA: Diagnosis not present

## 2019-09-27 DIAGNOSIS — M25561 Pain in right knee: Secondary | ICD-10-CM | POA: Diagnosis not present

## 2019-09-27 DIAGNOSIS — M170 Bilateral primary osteoarthritis of knee: Secondary | ICD-10-CM | POA: Diagnosis not present

## 2019-10-12 ENCOUNTER — Encounter: Payer: Self-pay | Admitting: Internal Medicine

## 2019-11-30 ENCOUNTER — Ambulatory Visit (AMBULATORY_SURGERY_CENTER): Payer: Self-pay | Admitting: *Deleted

## 2019-11-30 ENCOUNTER — Other Ambulatory Visit: Payer: Self-pay

## 2019-11-30 VITALS — Ht 62.0 in | Wt 177.0 lb

## 2019-11-30 DIAGNOSIS — Z86010 Personal history of colonic polyps: Secondary | ICD-10-CM

## 2019-11-30 MED ORDER — SUTAB 1479-225-188 MG PO TABS: 1.0000 | ORAL_TABLET | ORAL | 0 refills | Status: DC

## 2019-11-30 NOTE — Progress Notes (Signed)
Patient denies any allergies to egg or soy products. Patient denies complications with anesthesia/sedation.  Patient denies oxygen use at home and denies diet medications. Emmi instructions for colonoscopy explained and given to patient.  Patient had both covid vaccinations.  

## 2019-12-06 ENCOUNTER — Ambulatory Visit (AMBULATORY_SURGERY_CENTER): Payer: Medicare Other | Admitting: Internal Medicine

## 2019-12-06 ENCOUNTER — Encounter: Payer: Self-pay | Admitting: Internal Medicine

## 2019-12-06 ENCOUNTER — Other Ambulatory Visit: Payer: Self-pay

## 2019-12-06 VITALS — BP 129/72 | HR 62 | Temp 96.0°F | Resp 15 | Ht 62.0 in | Wt 177.0 lb

## 2019-12-06 DIAGNOSIS — Z86010 Personal history of colonic polyps: Secondary | ICD-10-CM | POA: Diagnosis not present

## 2019-12-06 DIAGNOSIS — K635 Polyp of colon: Secondary | ICD-10-CM

## 2019-12-06 DIAGNOSIS — D123 Benign neoplasm of transverse colon: Secondary | ICD-10-CM

## 2019-12-06 DIAGNOSIS — K621 Rectal polyp: Secondary | ICD-10-CM | POA: Diagnosis not present

## 2019-12-06 DIAGNOSIS — D128 Benign neoplasm of rectum: Secondary | ICD-10-CM

## 2019-12-06 DIAGNOSIS — D122 Benign neoplasm of ascending colon: Secondary | ICD-10-CM

## 2019-12-06 MED ORDER — SODIUM CHLORIDE 0.9 % IV SOLN: 500.0000 mL | Freq: Once | INTRAVENOUS | Status: DC

## 2019-12-06 NOTE — Progress Notes (Signed)
Pt's states no medical or surgical changes since previsit or office visit.   V/S-CW  CHECK-IN-JB

## 2019-12-06 NOTE — Progress Notes (Signed)
PT taken to PACU. Monitors in place. VSS. Report given to RN. 

## 2019-12-06 NOTE — Patient Instructions (Signed)
Handouts provided on polyps, diverticulosis and hemorrhoids.   YOU HAD AN ENDOSCOPIC PROCEDURE TODAY AT THE [facility]:   Refer to the procedure report that was given to you for any specific questions about what was found during the examination.  If the procedure report does not answer your questions, please call your gastroenterologist to clarify.  If you requested that your care partner not be given the details of your procedure findings, then the procedure report has been included in a sealed envelope for you to review at your convenience later.  YOU SHOULD EXPECT: Some feelings of bloating in the abdomen. Passage of more gas than usual.  Walking can help get rid of the air that was put into your GI tract during the procedure and reduce the bloating. If you had a lower endoscopy (such as a colonoscopy or flexible sigmoidoscopy) you may notice spotting of blood in your stool or on the toilet paper. If you underwent a bowel prep for your procedure, you may not have a normal bowel movement for a few days.  Please Note:  You might notice some irritation and congestion in your nose or some drainage.  This is from the oxygen used during your procedure.  There is no need for concern and it should clear up in a day or so.  SYMPTOMS TO REPORT IMMEDIATELY:   Following lower endoscopy (colonoscopy or flexible sigmoidoscopy):  Excessive amounts of blood in the stool  Significant tenderness or worsening of abdominal pains  Swelling of the abdomen that is new, acute  Fever of 100F or higher   For urgent or emergent issues, a gastroenterologist can be reached at any hour by calling [phone]. Do not use MyChart messaging for urgent concerns.    DIET:  We do recommend a small meal at first, but then you may proceed to your regular diet.  Drink plenty of fluids but you should avoid alcoholic beverages for 24 hours.  ACTIVITY:  You should plan to take it easy for the rest of today and  you should NOT DRIVE or use heavy machinery until tomorrow (because of the sedation medicines used during the test).    FOLLOW UP: Our staff will call the number listed on your records 48-72 hours following your procedure to check on you and address any questions or concerns that you may have regarding the information given to you following your procedure. If we do not reach you, we will leave a message.  We will attempt to reach you two times.  During this call, we will ask if you have developed any symptoms of COVID 19. If you develop any symptoms (ie: fever, flu-like symptoms, shortness of breath, cough etc.) before then, please call [phone].  If you test positive for Covid 19 in the 2 weeks post procedure, please call and report this information to us.    If any biopsies were taken you will be contacted by phone or by letter within the next 1-3 weeks.  Please call us at [phone] if you have not heard about the biopsies in 3 weeks.    SIGNATURES/CONFIDENTIALITY: You and/or your care partner have signed paperwork which will be entered into your electronic medical record.  These signatures attest to the fact that that the information above on your After Visit Summary has been reviewed and is understood.  Full responsibility of the confidentiality of this discharge information lies with you and/or your care-partner.  

## 2019-12-06 NOTE — Op Note (Signed)
[place] Patient Name: Jawana Reagor Procedure Date: 12/06/2019 1:29 PM MRN: [id number] Endoscopist: Jerene Bears , MD Age: 74 Referring MD:  Date of Birth: 03-25-1946 Gender: Female Account #: [id number] Procedure:                Colonoscopy Indications:              High risk colon cancer surveillance: Personal                            history of non-advanced adenoma, Last colonoscopy:                            May 2016 Medicines:                Monitored Anesthesia Care Procedure:                Pre-Anesthesia Assessment:                           - Prior to the procedure, a History and Physical                            was performed, and patient medications and                            allergies were reviewed. The patient's tolerance of                            previous anesthesia was also reviewed. The risks                            and benefits of the procedure and the sedation                            options and risks were discussed with the patient.                            All questions were answered, and informed consent                            was obtained. Prior Anticoagulants: The patient has                            taken no previous anticoagulant or antiplatelet                            agents. ASA Grade Assessment: III - A patient with                            severe systemic disease. After reviewing the risks                            and benefits, the patient was deemed in  satisfactory condition to undergo the procedure.                           After obtaining informed consent, the colonoscope                            was passed under direct vision. Throughout the                            procedure, the patient's blood pressure, pulse, and                            oxygen saturations were monitored continuously. The                            Colonoscope was introduced through the anus and                             advanced to the cecum, identified by appendiceal                            orifice and ileocecal valve. The colonoscopy was                            performed without difficulty. The patient tolerated                            the procedure well. The quality of the bowel                            preparation was good. The ileocecal valve,                            appendiceal orifice, and rectum were photographed. Scope In: 1:44:35 PM Scope Out: 2:05:53 PM Scope Withdrawal Time: 0 hours 17 minutes 20 seconds  Total Procedure Duration: 0 hours 21 minutes 18 seconds  Findings:                 The digital rectal exam was normal.                           A 2 mm polyp was found in the ascending colon. The                            polyp was sessile. The polyp was removed with a                            cold snare. Resection and retrieval were complete.                           Two sessile polyps were found in the transverse                            colon. The polyps were 3  to 4 mm in size. These                            polyps were removed with a cold snare. Resection                            and retrieval were complete.                           Two sessile polyps were found in the rectum. The                            polyps were 3 to 4 mm in size. These polyps were                            removed with a cold snare. Resection and retrieval                            were complete.                           Multiple small and large-mouthed diverticula were                            found in the sigmoid colon and descending colon.                           Internal hemorrhoids were found during                            retroflexion. The hemorrhoids were small. Complications:            No immediate complications. Estimated Blood Loss:     Estimated blood loss was minimal. Impression:               - One 2 mm polyp in the ascending colon, removed                             with a cold snare. Resected and retrieved.                           - Two 3 to 4 mm polyps in the transverse colon,                            removed with a cold snare. Resected and retrieved.                           - Two 3 to 4 mm polyps in the rectum, removed with                            a cold snare. Resected and retrieved.                           - Diverticulosis in the sigmoid  colon and in the                            descending colon.                           - Small internal hemorrhoids. Recommendation:           - Patient has a contact number available for                            emergencies. The signs and symptoms of potential                            delayed complications were discussed with the                            patient. Return to normal activities tomorrow.                            Written discharge instructions were provided to the                            patient.                           - Resume previous diet.                           - Continue present medications.                           - Await pathology results.                           - Repeat colonoscopy is recommended. The                            colonoscopy date will be determined after pathology                            results from today's exam become available for                            review. Jerene Bears, MD 12/06/2019 2:09:10 PM This report has been signed electronically.

## 2019-12-06 NOTE — Progress Notes (Signed)
Called to room to assist during endoscopic procedure.  Patient ID and intended procedure confirmed with present staff. Received instructions for my participation in the procedure from the performing physician.  

## 2019-12-08 ENCOUNTER — Other Ambulatory Visit: Payer: Self-pay | Admitting: Obstetrics & Gynecology

## 2019-12-08 ENCOUNTER — Telehealth: Payer: Self-pay

## 2019-12-08 DIAGNOSIS — Z1231 Encounter for screening mammogram for malignant neoplasm of breast: Secondary | ICD-10-CM

## 2019-12-08 NOTE — Telephone Encounter (Signed)
°  Follow up Call-  Call back number 12/06/2019  Post procedure Call Back phone  # [phone]  Permission to leave phone message Yes  Some recent data might be hidden     Patient questions:  Do you have a fever, pain , or abdominal swelling? No. Pain Score  0 *  Have you tolerated food without any problems? Yes.    Have you been able to return to your normal activities? Yes.    Do you have any questions about your discharge instructions: Diet   No. Medications  No. Follow up visit  No.  Do you have questions or concerns about your Care? No.  Actions: * If pain score is 4 or above: No action needed, pain <4.   1. Have you developed a fever since your procedure? No   2.   Have you had an respiratory symptoms (SOB or cough) since your procedure? No  3.   Have you tested positive for COVID 19 since your procedure? No  4.   Have you had any family members/close contacts diagnosed with the COVID 19 since your procedure?  No   If yes to any of these questions please route to Joylene John, RN and Erenest Rasher, RN

## 2019-12-09 ENCOUNTER — Encounter: Payer: Self-pay | Admitting: Internal Medicine

## 2019-12-10 DIAGNOSIS — H3540 Unspecified peripheral retinal degeneration: Secondary | ICD-10-CM | POA: Diagnosis not present

## 2019-12-10 DIAGNOSIS — D3131 Benign neoplasm of right choroid: Secondary | ICD-10-CM | POA: Diagnosis not present

## 2020-01-06 ENCOUNTER — Other Ambulatory Visit: Payer: Self-pay

## 2020-01-06 ENCOUNTER — Ambulatory Visit
Admission: RE | Admit: 2020-01-06 | Discharge: 2020-01-06 | Disposition: A | Discharge status: Home / Self Care | Payer: Medicare Other | Source: Ambulatory Visit | Attending: Obstetrics & Gynecology | Admitting: Obstetrics & Gynecology

## 2020-01-06 DIAGNOSIS — Z1231 Encounter for screening mammogram for malignant neoplasm of breast: Secondary | ICD-10-CM

## 2020-01-21 DIAGNOSIS — M170 Bilateral primary osteoarthritis of knee: Secondary | ICD-10-CM | POA: Diagnosis not present

## 2020-01-28 DIAGNOSIS — M25561 Pain in right knee: Secondary | ICD-10-CM | POA: Diagnosis not present

## 2020-01-28 DIAGNOSIS — M1711 Unilateral primary osteoarthritis, right knee: Secondary | ICD-10-CM | POA: Diagnosis not present

## 2020-02-03 DIAGNOSIS — Z20822 Contact with and (suspected) exposure to covid-19: Secondary | ICD-10-CM | POA: Diagnosis not present

## 2020-02-03 DIAGNOSIS — Z23 Encounter for immunization: Secondary | ICD-10-CM | POA: Diagnosis not present

## 2020-02-03 DIAGNOSIS — Z03818 Encounter for observation for suspected exposure to other biological agents ruled out: Secondary | ICD-10-CM | POA: Diagnosis not present

## 2020-02-04 DIAGNOSIS — M170 Bilateral primary osteoarthritis of knee: Secondary | ICD-10-CM | POA: Diagnosis not present

## 2020-03-08 DIAGNOSIS — M170 Bilateral primary osteoarthritis of knee: Secondary | ICD-10-CM | POA: Diagnosis not present

## 2020-04-09 DIAGNOSIS — Z20822 Contact with and (suspected) exposure to covid-19: Secondary | ICD-10-CM | POA: Diagnosis not present

## 2020-05-08 DIAGNOSIS — Z20822 Contact with and (suspected) exposure to covid-19: Secondary | ICD-10-CM | POA: Diagnosis not present

## 2020-05-16 ENCOUNTER — Encounter: Payer: Self-pay | Admitting: Internal Medicine

## 2020-05-18 ENCOUNTER — Encounter: Payer: Self-pay | Admitting: Internal Medicine

## 2020-06-01 ENCOUNTER — Encounter: Payer: Self-pay | Admitting: Internal Medicine

## 2020-06-01 ENCOUNTER — Other Ambulatory Visit: Payer: Self-pay

## 2020-06-01 ENCOUNTER — Telehealth (INDEPENDENT_AMBULATORY_CARE_PROVIDER_SITE_OTHER): Payer: Medicare Other | Admitting: Internal Medicine

## 2020-06-01 DIAGNOSIS — J0110 Acute frontal sinusitis, unspecified: Secondary | ICD-10-CM | POA: Diagnosis not present

## 2020-06-01 MED ORDER — AMOXICILLIN-POT CLAVULANATE 875-125 MG PO TABS: 1.0000 | ORAL_TABLET | Freq: Two times a day (BID) | ORAL | 0 refills | Status: DC

## 2020-06-01 MED ORDER — FLUTICASONE PROPIONATE 50 MCG/ACT NA SUSP: 2.0000 | Freq: Every day | NASAL | 6 refills | Status: DC

## 2020-06-01 NOTE — Progress Notes (Signed)
Virtual Visit via Audio Note  I connected with Toni [name] on 06/01/20 at  2:40 PM EST by an audio-only enabled telemedicine application and verified that I am speaking with the correct person using two identifiers.  The patient and the provider were at separate locations throughout the entire encounter. Patient location: home, Provider location: work   I discussed the limitations of evaluation and management by telemedicine and the availability of in person appointments. The patient expressed understanding and agreed to proceed. The patient and the provider were the only parties present for the visit unless noted in HPI below.  History of Present Illness: The patient is a 75 y.o. female with visit for sinus congestion and nose irritation. Started with covid-19 positive 05/16/20. Denies drainage in nose but pain and raw. Has no cough or breathing problems. Denies fevers or chills. Overall it is not improving well. Has tried flonase.   Observations/Objective: A and O times 3, voice strong  Assessment and Plan: See problem oriented charting  Follow Up Instructions: rx augmentin and flonase  Visit time 12 minutes in face to face communication with patient and coordination of care.  I discussed the assessment and treatment plan with the patient. The patient was provided an opportunity to ask questions and all were answered. The patient agreed with the plan and demonstrated an understanding of the instructions.   The patient was advised to call back or seek an in-person evaluation if the symptoms worsen or if the condition fails to improve as anticipated.  Myrlene Broker, MD

## 2020-06-01 NOTE — Assessment & Plan Note (Signed)
Rx augmentin and flonase.

## 2020-06-07 DIAGNOSIS — M25561 Pain in right knee: Secondary | ICD-10-CM | POA: Diagnosis not present

## 2020-06-14 DIAGNOSIS — M170 Bilateral primary osteoarthritis of knee: Secondary | ICD-10-CM | POA: Diagnosis not present

## 2020-06-14 DIAGNOSIS — S83281A Other tear of lateral meniscus, current injury, right knee, initial encounter: Secondary | ICD-10-CM | POA: Diagnosis not present

## 2020-08-10 ENCOUNTER — Ambulatory Visit (INDEPENDENT_AMBULATORY_CARE_PROVIDER_SITE_OTHER): Payer: Medicare Other

## 2020-08-10 ENCOUNTER — Other Ambulatory Visit: Payer: Self-pay

## 2020-08-10 DIAGNOSIS — Z0000 Encounter for general adult medical examination without abnormal findings: Secondary | ICD-10-CM | POA: Diagnosis not present

## 2020-08-10 NOTE — Progress Notes (Signed)
I connected with Toni [name] today by telephone and verified that I am speaking with the correct person using two identifiers. Location patient: home Location provider: work Persons participating in the virtual visit: Toni [name] and Lisette Abu, LPN.   I discussed the limitations, risks, security and privacy concerns of performing an evaluation and management service by telephone and the availability of in person appointments. I also discussed with the patient that there may be a patient responsible charge related to this service. The patient expressed understanding and verbally consented to this telephonic visit.    Interactive audio and video telecommunications were attempted between this provider and patient, however failed, due to patient having technical difficulties OR patient did not have access to video capability.  We continued and completed visit with audio only.  Some vital signs may be absent or patient reported.   Time Spent with patient on telephone encounter: 30 minutes  Subjective:   Toni [name] is a 75 y.o. female who presents for Medicare Annual (Subsequent) preventive examination.  Review of Systems    No ROS. Medicare Wellness Virtual Visit. Additional risk factors are reflected in social history. Cardiac Risk Factors include: advanced age (>35men, >106 women);dyslipidemia;family history of premature cardiovascular disease   Sleep Patterns: No sleep issues, feels rested on waking and sleeps 6-8 hours nightly. Home Safety/Smoke Alarms: Feels safe in home; uses home alarm. Smoke alarms in place. Living environment: 2-story home; Lives with spouse; no needs for DME; good support system. Seat Belt Safety/Bike Helmet: Wears seat belt.     Objective:    Today's Vitals   08/10/20 1440  PainSc: 7    There is no height or weight on file to calculate BMI.  Advanced Directives 08/10/2020 12/23/2018 11/04/2017 06/14/2016 02/13/2016 10/14/2014  06/08/2013  Does Patient Have a Medical Advance Directive? Yes Yes Yes No Yes Yes Patient has advance directive, copy not in chart  Type of Advance Directive - [place];Living will [place];Living will - [place];Living will [place] -  Does patient want to make changes to medical advance directive? No - Patient declined - - - - - -  Copy of [place] in Chart? - No - copy requested No - copy requested - No - copy requested - -    Current Medications (verified) Outpatient Encounter Medications as of 08/10/2020  Medication Sig  . Naproxen Sodium 220 MG CAPS Take 1 capsule by mouth daily as needed.   . pantoprazole (PROTONIX) 40 MG tablet Take 40 mg by mouth daily.  . [DISCONTINUED] amoxicillin-clavulanate (AUGMENTIN) 875-125 MG tablet Take 1 tablet by mouth 2 (two) times daily.  . [DISCONTINUED] fluticasone (FLONASE) 50 MCG/ACT nasal spray Place 2 sprays into both nostrils daily.   No facility-administered encounter medications on file as of 08/10/2020.    Allergies (verified) Dexilant [dexlansoprazole], Prevacid [lansoprazole], and Prilosec [omeprazole]   History: Past Medical History:  Diagnosis Date  . Aortic sclerosis 2000   on ECHO  . Arthritis    knees  . Colon polyps    Dr Sharlett Iles  . Diverticulosis of colon (without mention of hemorrhage)   . Esophageal reflux   . Eye abnormality    OD ocular freckle  . H/O hiatal hernia   . Hyperlipidemia 1999   LDL 158, resolved with diet  . Premature ventricular contractions   . Stricture and stenosis of esophagus    dilation X 3  .  SVT (supraventricular tachycardia) (HCC)    slow pathway ablated   Past Surgical History:  Procedure Laterality Date  . ANKLE SURGERY     right  . COLONOSCOPY  10/14/2014   pyrtle  . COLONOSCOPY W/ POLYPECTOMY  2011   Dr Sharlett Iles  . KNEE ARTHROSCOPY     bilateral  . ovarian wedge resection      to allow pregnancy  . SUPRAVENTRICULAR TACHYCARDIA ABLATION N/A 06/08/2013   Procedure: SUPRAVENTRICULAR TACHYCARDIA ABLATION;  Surgeon: Coralyn Mark, MD;  Location: [facility] CATH LAB;  Service: Cardiovascular;  Laterality: N/A;  . TONSILLECTOMY    . UPPER GASTROINTESTINAL ENDOSCOPY     [facility] yearly  . wisdom teeth ext     Family History  Problem Relation Age of Onset  . Stroke Father 97  . Arthritis Mother        OA  . Hypertension Mother   . Hypothyroidism Mother   . Stroke Mother 95       ischemic  . Heart attack Sister 22       smoker  . Ovarian cancer Sister   . Osteopenia Sister   . Hypertrophic cardiomyopathy Sister   . COPD Sister   . Heart failure Sister   . Hypothyroidism Sister   . Cardiomyopathy Sister   . Emphysema Sister   . Diabetes Maternal Grandmother   . Colon cancer Neg Hx   . Esophageal cancer Neg Hx   . Liver disease Neg Hx   . Stomach cancer Neg Hx   . Rectal cancer Neg Hx    Social History   Socioeconomic History  . Marital status: Married    Spouse name: Not on file  . Number of children: 2  . Years of education: Not on file  . Highest education level: Not on file  Occupational History  . Occupation: Producer, 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[employer]  Tobacco Use  . Smoking status: Never Smoker  . Smokeless tobacco: Never Used  Vaping Use  . Vaping Use: Never used  Substance and Sexual Activity  . Alcohol use: Yes    Alcohol/week: 0.0 standard drinks    Comment: socially, once / month  . Drug use: No  . Sexual activity: Yes    Partners: Male    Birth control/protection: Post-menopausal    Comment: 1st intercourse- 35, married- 63 yrs  Other Topics Concern  . Not on file  Social History Narrative  . Not on file   Social Determinants of Health   Financial Resource Strain: Low Risk   . Difficulty of Paying Living Expenses: Not hard at all  Food Insecurity: No Food Insecurity  . Worried About Charity fundraiser in the Last Year:  Never true  . Ran Out of Food in the Last Year: Never true  Transportation Needs: No Transportation Needs  . Lack of Transportation (Medical): No  . Lack of Transportation (Non-Medical): No  Physical Activity: Inactive  . Days of Exercise per Week: 0 days  . Minutes of Exercise per Session: 0 min  Stress: No Stress Concern Present  . Feeling of Stress : Not at all  Social Connections: Socially Integrated  . Frequency of Communication with Friends and Family: More than three times a week  . Frequency of Social Gatherings with Friends and Family: Once a week  . Attends Religious Services: More than 4 times per year  . Active Member of Clubs or Organizations: No  . Attends Archivist Meetings: More than  4 times per year  . Marital Status: Married    Tobacco Counseling Counseling given: Not Answered   Clinical Intake:  Pre-visit preparation completed: Yes  Pain : 0-10 Pain Score: 7  Pain Type: Chronic pain Pain Location: Knee Pain Orientation: Right Pain Radiating Towards: n/a Pain Descriptors / Indicators: Constant,Throbbing,Discomfort,Pressure Pain Onset: More than a month ago Pain Frequency: Constant Pain Relieving Factors: cortisone injections Effect of Pain on Daily Activities: Pain produces disability and affects the quality of life.  Pain Relieving Factors: cortisone injections  Nutritional Risks: None Diabetes: No  How often do you need to have someone help you when you read instructions, pamphlets, or other written materials from your doctor or pharmacy?: 1 - Never What is the last grade level you completed in school?: [employer]; some college courses  Diabetic? No  Interpreter Needed?: No  Information entered by :: Lisette Abu, LPN   Activities of Daily Living In your present state of health, do you have any difficulty performing the following activities: 08/10/2020  Hearing? Y  Comment left ear  Vision? N  Difficulty  concentrating or making decisions? N  Walking or climbing stairs? N  Dressing or bathing? N  Doing errands, shopping? N  Preparing Food and eating ? N  Using the Toilet? N  In the past six months, have you accidently leaked urine? N  Do you have problems with loss of bowel control? N  Managing your Medications? N  Managing your Finances? N  Housekeeping or managing your Housekeeping? N  Some recent data might be hidden    Patient Care Team: Binnie Rail, MD as PCP - General (Internal Medicine) Thompson Grayer, MD as PCP - Cardiology (Cardiology) Juluis Rainier as Consulting Physician (Optometry) Pyrtle, Lajuan Lines, MD as Consulting Physician (Gastroenterology)  Indicate any recent Medical Services you may have received from other than [facility] providers in the past year (date may be approximate).     Assessment:   This is a routine wellness examination for [place].  Hearing/Vision screen No exam data present  Dietary issues and exercise activities discussed: Current Exercise Habits: The patient does not participate in regular exercise at present, Exercise limited by: orthopedic condition(s);cardiac condition(s)  Goals    . Patient Stated     I want to lose weight by eating healthy and exercise. I want to enjoy life, family and travel.      Depression Screen PHQ 2/9 Scores 08/10/2020 12/23/2018 11/04/2017  PHQ - 2 Score 0 0 0  PHQ- 9 Score - - 0    Fall Risk Fall Risk  08/10/2020 12/23/2018 11/04/2017 01/19/2016  Falls in the past year? 0 0 No No  Comment - - - Emmi Telephone Survey: data to providers prior to load  Number falls in past yr: 0 0 - -  Injury with Fall? 0 - - -  Risk for fall due to : No Fall Risks - - -  Follow up Falls evaluation completed - - -    FALL RISK PREVENTION PERTAINING TO THE HOME:  Any stairs in or around the home? Yes  If so, are there any without handrails? No  Home free of loose throw rugs in walkways, pet beds, electrical cords, etc? Yes  Adequate  lighting in your home to reduce risk of falls? Yes   ASSISTIVE DEVICES UTILIZED TO PREVENT FALLS:  Life alert? No  Use of a cane, walker or w/c? No  Grab bars in the bathroom? No  Shower  chair or bench in shower? No  Elevated toilet seat or a handicapped toilet? No   TIMED UP AND GO:  Was the test performed? No .  Length of time to ambulate 10 feet: 0 sec.   Gait steady and fast without use of assistive device  Cognitive Function: No flowsheet data found.          Immunizations Immunization History  Administered Date(s) Administered  . Influenza, High Dose Seasonal PF 02/21/2017, 02/06/2018  . Influenza,inj,Quad PF,6+ Mos 02/24/2013, 02/07/2015  . Influenza-Unspecified 02/10/2014, 01/11/2019  . PFIZER(Purple Top)SARS-COV-2 Vaccination 06/21/2019, 07/12/2019  . Pneumococcal Conjugate-13 02/16/2014  . Pneumococcal Polysaccharide-23 06/08/2015  . Td 05/27/2001  . Tdap 06/27/2010, 02/26/2011  . Zoster 04/12/2010    TDAP status: Up to date  Flu Vaccine status: Up to date  Pneumococcal vaccine status: Up to date  Covid-19 vaccine status: Completed vaccines  Qualifies for Shingles Vaccine? Yes   Zostavax completed Yes   Shingrix Completed?: No.    Education has been provided regarding the importance of this vaccine. Patient has been advised to call insurance company to determine out of pocket expense if they have not yet received this vaccine. Advised may also receive vaccine at local pharmacy or Health Dept. Verbalized acceptance and understanding.  Screening Tests Health Maintenance  Topic Date Due  . COVID-19 Vaccine (3 - Booster for Pfizer series) 01/09/2020  . TETANUS/TDAP  02/25/2021  . DEXA SCAN  01/03/2022  . MAMMOGRAM  01/05/2022  . COLONOSCOPY (Pts 45-46yrs Insurance coverage will need to be confirmed)  12/05/2024  . INFLUENZA VACCINE  Completed  . Hepatitis C Screening  Completed  . PNA vac Low Risk Adult  Completed  . HPV VACCINES  Aged Out     Health Maintenance  Health Maintenance Due  Topic Date Due  . COVID-19 Vaccine (3 - Booster for Pfizer series) 01/09/2020    Colorectal cancer screening: Type of screening: Colonoscopy. Completed 12/06/2019. Repeat every 5 years  Mammogram status: Completed 01/06/2020. Repeat every year  Bone Density status: Completed 01/04/2019. Results reflect: Bone density results: OSTEOPENIA. Repeat every 3 years.  Lung Cancer Screening: (Low Dose CT Chest recommended if Age 94-80 years, 30 pack-year currently smoking OR have quit w/in 15years.) does not qualify.   Lung Cancer Screening Referral: No  Additional Screening:  Hepatitis C Screening: does qualify; Completed: Yes  Vision Screening: Recommended annual ophthalmology exams for early detection of glaucoma and other disorders of the eye. Is the patient up to date with their annual eye exam?  Yes  Who is the provider or what is the name of the office in which the patient attends annual eye exams? Alois Cliche, OD. If pt is not established with a provider, would they like to be referred to a provider to establish care? No .   Dental Screening: Recommended annual dental exams for proper oral hygiene  Community Resource Referral / Chronic Care Management: CRR required this visit?  No   CCM required this visit?  No      Plan:     I have personally reviewed and noted the following in the patient's chart:   . Medical and social history . Use of alcohol, tobacco or illicit drugs  . Current medications and supplements . Functional ability and status . Nutritional status . Physical activity . Advanced directives . List of other physicians . Hospitalizations, surgeries, and ER visits in previous 12 months . Vitals . Screenings to include cognitive, depression, and falls . Referrals and  appointments  In addition, I have reviewed and discussed with patient certain preventive protocols, quality metrics, and best practice  recommendations. A written personalized care plan for preventive services as well as general preventive health recommendations were provided to patient.     Sheral Flow, LPN   [id number]/[id number]   Nurse Notes:  Patient is cogitatively intact. There were no vitals filed for this visit. There is no height or weight on file to calculate BMI. Patient stated that she has no issues with gait or balance; does not use any assistive devices. Medications reviewed with patient; no opioid use noted.

## 2020-08-10 NOTE — Patient Instructions (Signed)
Toni [name] , Thank you for taking time to come for your Medicare Wellness Visit. I appreciate your ongoing commitment to your health goals. Please review the following plan we discussed and let me know if I can assist you in the future.   Screening recommendations/referrals: Colonoscopy: 12/06/2019; due every 5 years Mammogram: 01/06/2020; due every 1-2 years Bone Density: 01/04/2019; due every 3 years Recommended yearly ophthalmology/optometry visit for glaucoma screening and checkup Recommended yearly dental visit for hygiene and checkup  Vaccinations: Influenza vaccine: 02/03/2020 Pneumococcal vaccine: 02/16/2014, 06/08/2015 Tdap vaccine: 02/26/2011; due every 10 years Shingles vaccine: never done   Covid-19: 06/21/2019, 07/12/2019  Advanced directives: Please bring a copy of your health care power of attorney and living will to the office at your convenience.  Conditions/risks identified: Yes; Reviewed health maintenance screenings with patient today and relevant education, vaccines, and/or referrals were provided. Please continue to do your personal lifestyle choices by: daily care of teeth and gums, regular physical activity (goal should be 5 days a week for 30 minutes), eat a healthy diet, avoid tobacco and drug use, limiting any alcohol intake, taking a low-dose aspirin (if not allergic or have been advised by your provider otherwise) and taking vitamins and minerals as recommended by your provider. Continue doing brain stimulating activities (puzzles, reading, adult coloring books, staying active) to keep memory sharp. Continue to eat heart healthy diet (full of fruits, vegetables, whole grains, lean protein, water--limit salt, fat, and sugar intake) and increase physical activity as tolerated.  Next appointment: Please schedule your next Medicare Wellness Visit with your Nurse Health Advisor in 1 year by calling [phone].   Preventive Care 73 Years and Older, Female Preventive care  refers to lifestyle choices and visits with your health care provider that can promote health and wellness. What does preventive care include?  A yearly physical exam. This is also called an annual well check.  Dental exams once or twice a year.  Routine eye exams. Ask your health care provider how often you should have your eyes checked.  Personal lifestyle choices, including:  Daily care of your teeth and gums.  Regular physical activity.  Eating a healthy diet.  Avoiding tobacco and drug use.  Limiting alcohol use.  Practicing safe sex.  Taking low-dose aspirin every day.  Taking vitamin and mineral supplements as recommended by your health care provider. What happens during an annual well check? The services and screenings done by your health care provider during your annual well check will depend on your age, overall health, lifestyle risk factors, and family history of disease. Counseling  Your health care provider may ask you questions about your:  Alcohol use.  Tobacco use.  Drug use.  Emotional well-being.  Home and relationship well-being.  Sexual activity.  Eating habits.  History of falls.  Memory and ability to understand (cognition).  Work and work Statistician.  Reproductive health. Screening  You may have the following tests or measurements:  Height, weight, and BMI.  Blood pressure.  Lipid and cholesterol levels. These may be checked every 5 years, or more frequently if you are over 18 years old.  Skin check.  Lung cancer screening. You may have this screening every year starting at age 44 if you have a 30-pack-year history of smoking and currently smoke or have quit within the past 15 years.  Fecal occult blood test (FOBT) of the stool. You may have this test every year starting at age 110.  Flexible sigmoidoscopy or colonoscopy. You  may have a sigmoidoscopy every 5 years or a colonoscopy every 10 years starting at age  16.  Hepatitis C blood test.  Hepatitis B blood test.  Sexually transmitted disease (STD) testing.  Diabetes screening. This is done by checking your blood sugar (glucose) after you have not eaten for a while (fasting). You may have this done every 1-3 years.  Bone density scan. This is done to screen for osteoporosis. You may have this done starting at age 38.  Mammogram. This may be done every 1-2 years. Talk to your health care provider about how often you should have regular mammograms. Talk with your health care provider about your test results, treatment options, and if necessary, the need for more tests. Vaccines  Your health care provider may recommend certain vaccines, such as:  Influenza vaccine. This is recommended every year.  Tetanus, diphtheria, and acellular pertussis (Tdap, Td) vaccine. You may need a Td booster every 10 years.  Zoster vaccine. You may need this after age 32.  Pneumococcal 13-valent conjugate (PCV13) vaccine. One dose is recommended after age 51.  Pneumococcal polysaccharide (PPSV23) vaccine. One dose is recommended after age 17. Talk to your health care provider about which screenings and vaccines you need and how often you need them. This information is not intended to replace advice given to you by your health care provider. Make sure you discuss any questions you have with your health care provider. Document Released: 06/09/2015 Document Revised: 01/31/2016 Document Reviewed: 03/14/2015 Elsevier Interactive Patient Education  2017 [place] Prevention in the Home Falls can cause injuries. They can happen to people of all ages. There are many things you can do to make your home safe and to help prevent falls. What can I do on the outside of my home?  Regularly fix the edges of walkways and driveways and fix any cracks.  Remove anything that might make you trip as you walk through a door, such as a raised step or threshold.  Trim  any bushes or trees on the path to your home.  Use bright outdoor lighting.  Clear any walking paths of anything that might make someone trip, such as rocks or tools.  Regularly check to see if handrails are loose or broken. Make sure that both sides of any steps have handrails.  Any raised decks and porches should have guardrails on the edges.  Have any leaves, snow, or ice cleared regularly.  Use sand or salt on walking paths during winter.  Clean up any spills in your garage right away. This includes oil or grease spills. What can I do in the bathroom?  Use night lights.  Install grab bars by the toilet and in the tub and shower. Do not use towel bars as grab bars.  Use non-skid mats or decals in the tub or shower.  If you need to sit down in the shower, use a plastic, non-slip stool.  Keep the floor dry. Clean up any water that spills on the floor as soon as it happens.  Remove soap buildup in the tub or shower regularly.  Attach bath mats securely with double-sided non-slip rug tape.  Do not have throw rugs and other things on the floor that can make you trip. What can I do in the bedroom?  Use night lights.  Make sure that you have a light by your bed that is easy to reach.  Do not use any sheets or blankets that are too big for  your bed. They should not hang down onto the floor.  Have a firm chair that has side arms. You can use this for support while you get dressed.  Do not have throw rugs and other things on the floor that can make you trip. What can I do in the kitchen?  Clean up any spills right away.  Avoid walking on wet floors.  Keep items that you use a lot in easy-to-reach places.  If you need to reach something above you, use a strong step stool that has a grab bar.  Keep electrical cords out of the way.  Do not use floor polish or wax that makes floors slippery. If you must use wax, use non-skid floor wax.  Do not have throw rugs and other  things on the floor that can make you trip. What can I do with my stairs?  Do not leave any items on the stairs.  Make sure that there are handrails on both sides of the stairs and use them. Fix handrails that are broken or loose. Make sure that handrails are as long as the stairways.  Check any carpeting to make sure that it is firmly attached to the stairs. Fix any carpet that is loose or worn.  Avoid having throw rugs at the top or bottom of the stairs. If you do have throw rugs, attach them to the floor with carpet tape.  Make sure that you have a light switch at the top of the stairs and the bottom of the stairs. If you do not have them, ask someone to add them for you. What else can I do to help prevent falls?  Wear shoes that:  Do not have high heels.  Have rubber bottoms.  Are comfortable and fit you well.  Are closed at the toe. Do not wear sandals.  If you use a stepladder:  Make sure that it is fully opened. Do not climb a closed stepladder.  Make sure that both sides of the stepladder are locked into place.  Ask someone to hold it for you, if possible.  Clearly mark and make sure that you can see:  Any grab bars or handrails.  First and last steps.  Where the edge of each step is.  Use tools that help you move around (mobility aids) if they are needed. These include:  Canes.  Walkers.  Scooters.  Crutches.  Turn on the lights when you go into a dark area. Replace any light bulbs as soon as they burn out.  Set up your furniture so you have a clear path. Avoid moving your furniture around.  If any of your floors are uneven, fix them.  If there are any pets around you, be aware of where they are.  Review your medicines with your doctor. Some medicines can make you feel dizzy. This can increase your chance of falling. Ask your doctor what other things that you can do to help prevent falls. This information is not intended to replace advice given to  you by your health care provider. Make sure you discuss any questions you have with your health care provider. Document Released: 03/09/2009 Document Revised: 10/19/2015 Document Reviewed: 06/17/2014 Elsevier Interactive Patient Education  2017 [employer].

## 2020-09-12 DIAGNOSIS — Z01818 Encounter for other preprocedural examination: Secondary | ICD-10-CM | POA: Insufficient documentation

## 2020-09-12 NOTE — Patient Instructions (Addendum)
  Blood work was ordered.     Medications changes include :   none    Your EKG looks good.

## 2020-09-12 NOTE — Progress Notes (Signed)
Subjective:    Patient ID: Toni [name], female    DOB: December 08, 1945, 75 y.o.   MRN: [id number]  HPI She is here for pre-operative clearance at the request of Dr Theda Sers for Right TKA with spinal anesthesia scheduled for June 7th, 2022.   She denies any personal or family history of problems with anesthesia or bleeding/blood clot problems.    She has no concerns.  She is taking all her medication as prescribed.   She is exercising regularly.  With her daily activities and exercise she denies chest pain, palpitations, SOB and lightheadedness.     Medications and allergies reviewed with patient and updated if appropriate.  Patient Active Problem List   Diagnosis Date Noted  . Preop examination 09/12/2020  . Hearing loss of left ear 01/12/2018  . Vertigo 12/07/2017  . Osteopenia 09/23/2016  . 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 Sinusitis 06/08/2015  . Postmenopausal estrogen deficiency 03/10/2015  . Other abnormal glucose 02/10/2014  . SVT (supraventricular tachycardia) ([place]) 06/08/2013  . PVC's (premature ventricular contractions) 07/28/2011  . Hyperlipidemia 04/12/2010  . AORTIC SCLEROSIS 04/12/2010  . COLONIC POLYPS, HX OF 04/12/2010  . ESOPHAGEAL STRICTURE 05/29/2009  . GERD 05/29/2009  . DIVERTICULOSIS, COLON 05/29/2009    Current Outpatient Medications on File Prior to Visit  Medication Sig Dispense Refill  . Naproxen Sodium 220 MG CAPS Take 1 capsule by mouth daily as needed.     . pantoprazole (PROTONIX) 40 MG tablet Take 40 mg by mouth daily.     No current facility-administered medications on file prior to visit.    Past Medical History:  Diagnosis Date  . Aortic sclerosis 2000   on ECHO  . Arthritis    knees  . Colon polyps    Dr Sharlett Iles  . Diverticulosis of colon (without mention of hemorrhage)   . Esophageal reflux   . Eye abnormality    OD ocular freckle  . H/O hiatal hernia   . Hyperlipidemia 1999   LDL 158, resolved with diet  . Premature  ventricular contractions   . Stricture and stenosis of esophagus    dilation X 3  . SVT (supraventricular tachycardia) ([facility])    slow pathway ablated    Past Surgical History:  Procedure Laterality Date  . ANKLE SURGERY     right  . COLONOSCOPY  10/14/2014   pyrtle  . COLONOSCOPY W/ POLYPECTOMY  2011   Dr Sharlett Iles  . KNEE ARTHROSCOPY     bilateral  . ovarian wedge resection     to allow pregnancy  . SUPRAVENTRICULAR TACHYCARDIA ABLATION N/A 06/08/2013   Procedure: SUPRAVENTRICULAR TACHYCARDIA ABLATION;  Surgeon: Coralyn Mark, MD;  Location: [facility] CATH LAB;  Service: Cardiovascular;  Laterality: N/A;  . TONSILLECTOMY    . UPPER GASTROINTESTINAL ENDOSCOPY     [facility] yearly  . wisdom teeth ext      Social History   Socioeconomic History  . Marital status: Married    Spouse name: Not on file  . Number of children: 2  . Years of education: Not on file  . Highest education level: Not on file  Occupational History  . Occupation: Producer, 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[employer]  Tobacco Use  . Smoking status: Never Smoker  . Smokeless tobacco: Never Used  Vaping Use  . Vaping Use: Never used  Substance and Sexual Activity  . Alcohol use: Yes    Alcohol/week: 0.0 standard drinks    Comment: socially, once / month  .  Drug use: No  . Sexual activity: Yes    Partners: Male    Birth control/protection: Post-menopausal    Comment: 1st intercourse- 70, married- 51 yrs  Other Topics Concern  . Not on file  Social History Narrative  . Not on file   Social Determinants of Health   Financial Resource Strain: Low Risk   . Difficulty of Paying Living Expenses: Not hard at all  Food Insecurity: No Food Insecurity  . Worried About Charity fundraiser in the Last Year: Never true  . Ran Out of Food in the Last Year: Never true  Transportation Needs: No Transportation Needs  . Lack of Transportation (Medical): No  . Lack of Transportation (Non-Medical): No  Physical Activity:  Inactive  . Days of Exercise per Week: 0 days  . Minutes of Exercise per Session: 0 min  Stress: No Stress Concern Present  . Feeling of Stress : Not at all  Social Connections: Socially Integrated  . Frequency of Communication with Friends and Family: More than three times a week  . Frequency of Social Gatherings with Friends and Family: Once a week  . Attends Religious Services: More than 4 times per year  . Active Member of Clubs or Organizations: No  . Attends Archivist Meetings: More than 4 times per year  . Marital Status: Married    Family History  Problem Relation Age of Onset  . Stroke Father 59  . Arthritis Mother        OA  . Hypertension Mother   . Hypothyroidism Mother   . Stroke Mother 95       ischemic  . Heart attack Sister 95       smoker  . Ovarian cancer Sister   . Osteopenia Sister   . Hypertrophic cardiomyopathy Sister   . COPD Sister   . Heart failure Sister   . Hypothyroidism Sister   . Cardiomyopathy Sister   . Emphysema Sister   . Diabetes Maternal Grandmother   . Colon cancer Neg Hx   . Esophageal cancer Neg Hx   . Liver disease Neg Hx   . Stomach cancer Neg Hx   . Rectal cancer Neg Hx     Review of Systems  Constitutional: Negative for chills and fever.  Eyes: Negative for visual disturbance.  Respiratory: Negative for cough, shortness of breath and wheezing.   Cardiovascular: Negative for chest pain and palpitations.  Gastrointestinal: Negative for abdominal pain, blood in stool, constipation, diarrhea and nausea.  Genitourinary: Negative for dysuria and hematuria.  Musculoskeletal: Positive for arthralgias.  Skin: Negative for rash.  Neurological: Negative for dizziness, light-headedness, numbness and headaches.  Hematological: Bruises/bleeds easily (arms only).  Psychiatric/Behavioral: Negative for dysphoric mood. The patient is not nervous/anxious.        Objective:   Vitals:   09/13/20 0851  BP: 140/82  Pulse: 83   Temp: 98.1 F (36.7 C)  SpO2: 97%   BP Readings from Last 3 Encounters:  09/13/20 140/82  12/06/19 129/72  01/07/19 (!) 144/90   Wt Readings from Last 3 Encounters:  09/13/20 173 lb 6.4 oz (78.7 kg)  12/06/19 177 lb (80.3 kg)  11/30/19 177 lb (80.3 kg)   Body mass index is 31.72 kg/m.   Physical Exam    Constitutional: She appears well-developed and well-nourished. No distress.  HENT:  Head: Normocephalic and atraumatic.  Right Ear: External ear normal. Normal ear canal and TM Left Ear: External ear normal.  Normal ear  canal and TM Mouth/Throat: Oropharynx is clear and moist.  Eyes: Conjunctivae and EOM are normal.  Neck: Neck supple. No tracheal deviation present. No thyromegaly present.  No carotid bruit  Cardiovascular: Normal rate, regular rhythm and normal heart sounds.   No murmur heard.  No edema. Pulmonary/Chest: Effort normal and breath sounds normal. No respiratory distress. She has no wheezes. She has no rales.  Abdominal: Soft. She exhibits no distension. There is no tenderness.  Lymphadenopathy: She has no cervical adenopathy.  Skin: Skin is warm and dry. She is not diaphoretic.  Psychiatric: She has a normal mood and affect. Her behavior is normal.       Assessment & Plan:    See Problem List for Assessment and Plan of chronic medical problems.    This visit occurred during the SARS-CoV-2 public health emergency.  Safety protocols were in place, including screening questions prior to the visit, additional usage of staff PPE, and extensive cleaning of exam room while observing appropriate contact time as indicated for disinfecting solutions.

## 2020-09-13 ENCOUNTER — Ambulatory Visit (INDEPENDENT_AMBULATORY_CARE_PROVIDER_SITE_OTHER): Payer: Medicare Other | Admitting: Internal Medicine

## 2020-09-13 ENCOUNTER — Encounter: Payer: Self-pay | Admitting: Internal Medicine

## 2020-09-13 ENCOUNTER — Other Ambulatory Visit: Payer: Self-pay

## 2020-09-13 VITALS — BP 140/82 | HR 83 | Temp 98.1°F | Ht 62.0 in | Wt 173.4 lb

## 2020-09-13 DIAGNOSIS — R7309 Other abnormal glucose: Secondary | ICD-10-CM | POA: Diagnosis not present

## 2020-09-13 DIAGNOSIS — Z01818 Encounter for other preprocedural examination: Secondary | ICD-10-CM | POA: Diagnosis not present

## 2020-09-13 DIAGNOSIS — E782 Mixed hyperlipidemia: Secondary | ICD-10-CM | POA: Diagnosis not present

## 2020-09-13 DIAGNOSIS — K219 Gastro-esophageal reflux disease without esophagitis: Secondary | ICD-10-CM | POA: Diagnosis not present

## 2020-09-13 LAB — CBC WITH DIFFERENTIAL/PLATELET
Basophils Absolute: 0 10*3/uL (ref 0.0–0.1)
Basophils Relative: 0.6 % (ref 0.0–3.0)
Eosinophils Absolute: 0 10*3/uL (ref 0.0–0.7)
Eosinophils Relative: 0.7 % (ref 0.0–5.0)
HCT: 40.1 % (ref 36.0–46.0)
Hemoglobin: 13.6 g/dL (ref 12.0–15.0)
Lymphocytes Relative: 28.2 % (ref 12.0–46.0)
Lymphs Abs: 1.6 10*3/uL (ref 0.7–4.0)
MCHC: 34 g/dL (ref 30.0–36.0)
MCV: 87 fl (ref 78.0–100.0)
Monocytes Absolute: 0.3 10*3/uL (ref 0.1–1.0)
Monocytes Relative: 5.1 % (ref 3.0–12.0)
Neutro Abs: 3.7 10*3/uL (ref 1.4–7.7)
Neutrophils Relative %: 65.4 % (ref 43.0–77.0)
Platelets: 251 10*3/uL (ref 150.0–400.0)
RBC: 4.61 Mil/uL (ref 3.87–5.11)
RDW: 13.4 % (ref 11.5–15.5)
WBC: 5.7 10*3/uL (ref 4.0–10.5)

## 2020-09-13 LAB — COMPREHENSIVE METABOLIC PANEL
ALT: 10 U/L (ref 0–35)
AST: 12 U/L (ref 0–37)
Albumin: 4.1 g/dL (ref 3.5–5.2)
Alkaline Phosphatase: 88 U/L (ref 39–117)
BUN: 16 mg/dL (ref 6–23)
CO2: 29 mEq/L (ref 19–32)
Calcium: 9.4 mg/dL (ref 8.4–10.5)
Chloride: 105 mEq/L (ref 96–112)
Creatinine, Ser: 0.76 mg/dL (ref 0.40–1.20)
GFR: 77.87 mL/min (ref >60.00)
Glucose, Bld: 85 mg/dL (ref 70–99)
Potassium: 4.2 mEq/L (ref 3.5–5.1)
Sodium: 142 mEq/L (ref 135–145)
Total Bilirubin: 0.5 mg/dL (ref 0.2–1.2)
Total Protein: 7.2 g/dL (ref 6.0–8.3)

## 2020-09-13 LAB — LIPID PANEL
Cholesterol: 193 mg/dL (ref 0–200)
HDL: 68.2 mg/dL (ref >39.00)
LDL Cholesterol: 109 mg/dL — ABNORMAL HIGH (ref 0–99)
NonHDL: 124.94
Total CHOL/HDL Ratio: 3
Triglycerides: 82 mg/dL (ref 0.0–149.0)
VLDL: 16.4 mg/dL (ref 0.0–40.0)

## 2020-09-13 LAB — URINALYSIS, ROUTINE W REFLEX MICROSCOPIC
Bilirubin Urine: NEGATIVE
Ketones, ur: NEGATIVE
Nitrite: NEGATIVE
Specific Gravity, Urine: 1.025 (ref 1.000–1.030)
Total Protein, Urine: NEGATIVE
Urine Glucose: NEGATIVE
Urobilinogen, UA: 0.2 (ref 0.0–1.0)
pH: 5 (ref 5.0–8.0)

## 2020-09-13 LAB — HEMOGLOBIN A1C: Hgb A1c MFr Bld: 5.1 % (ref 4.6–6.5)

## 2020-09-13 MED ORDER — MULTIVITAMINS PO CAPS: 1.0000 | ORAL_CAPSULE | Freq: Every day | ORAL | Status: DC

## 2020-09-13 MED ORDER — VITAMIN B-12 1000 MCG PO TABS: 1000.0000 ug | ORAL_TABLET | Freq: Every day | ORAL | Status: DC

## 2020-09-13 MED ORDER — VITAMIN D3 25 MCG (1000 UT) PO CAPS: 1000.0000 [IU] | ORAL_CAPSULE | Freq: Every day | ORAL | Status: DC

## 2020-09-13 NOTE — Assessment & Plan Note (Signed)
Check A1c. 

## 2020-09-13 NOTE — Assessment & Plan Note (Signed)
Current Check lipid panel, CMP Continue regular exercise and healthy diet Currently diet controlled

## 2020-09-13 NOTE — Assessment & Plan Note (Signed)
Chronic hiatral hernia, h/o pre-cancerous cells GERD controlled Continue protonix 40 mg daily

## 2020-09-13 NOTE — Assessment & Plan Note (Signed)
Here for preop evaluation for Dr. Theda Sers for right total knee replacement Chronic medical problems stable No history of or concerning symptoms suggestive of CAD, lung disease Labs today - cbc, cmp, a1c, urinalysis EKG today - NSR 76 bmp, possible LAE poor R wave progression.  Compared to previous EKGs-most recent May 2015 there has been no significant changes   Low risk for low risk surgery - cleared for surgery.

## 2020-09-15 ENCOUNTER — Other Ambulatory Visit: Payer: Self-pay

## 2020-09-15 ENCOUNTER — Ambulatory Visit (INDEPENDENT_AMBULATORY_CARE_PROVIDER_SITE_OTHER): Payer: Medicare Other | Admitting: Otolaryngology

## 2020-09-15 ENCOUNTER — Encounter (INDEPENDENT_AMBULATORY_CARE_PROVIDER_SITE_OTHER): Payer: Self-pay | Admitting: Otolaryngology

## 2020-09-15 VITALS — Temp 97.3°F

## 2020-09-15 DIAGNOSIS — J310 Chronic rhinitis: Secondary | ICD-10-CM

## 2020-09-15 NOTE — Progress Notes (Signed)
HPI: Toni [name] is a 75 y.o. female who presents for evaluation of complaints of aching in the nose when she is in a cold environment.  This initially began following a bout of COVID in December of last year.  She complains of pain in the nose when the environment is cold.  When she is in warmer weather it does not seem to bother her.  She has had no drainage from her nose.  No mucopurulent discharge.  No real congestion of the nose. She also has some congestion of the left ear that has been checked out previously.  This began following a squirt gun for water in her ear canal on the left side.  She has been followed with Dr. Ernesto Rutherford previously until he retired. She has previously been treated with Flonase for the ear congestion.  Past Medical History:  Diagnosis Date  . Aortic sclerosis 2000   on ECHO  . Arthritis    knees  . Colon polyps    Dr Sharlett Iles  . Diverticulosis of colon (without mention of hemorrhage)   . Esophageal reflux   . Eye abnormality    OD ocular freckle  . H/O hiatal hernia   . Hyperlipidemia 1999   LDL 158, resolved with diet  . Premature ventricular contractions   . Stricture and stenosis of esophagus    dilation X 3  . SVT (supraventricular tachycardia) (HCC)    slow pathway ablated   Past Surgical History:  Procedure Laterality Date  . ANKLE SURGERY     right  . COLONOSCOPY  10/14/2014   pyrtle  . COLONOSCOPY W/ POLYPECTOMY  2011   Dr Sharlett Iles  . KNEE ARTHROSCOPY     bilateral  . ovarian wedge resection     to allow pregnancy  . SUPRAVENTRICULAR TACHYCARDIA ABLATION N/A 06/08/2013   Procedure: SUPRAVENTRICULAR TACHYCARDIA ABLATION;  Surgeon: Coralyn Mark, MD;  Location: [facility] CATH LAB;  Service: Cardiovascular;  Laterality: N/A;  . TONSILLECTOMY    . UPPER GASTROINTESTINAL ENDOSCOPY     [facility] yearly  . wisdom teeth ext     Social History   Socioeconomic History  . Marital status: Married    Spouse name: Not on file  .  Number of children: 2  . Years of education: Not on file  . Highest education level: Not on file  Occupational History  . 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 Smoking status: Never Smoker  . Smokeless tobacco: Never Used  Vaping Use  . Vaping Use: Never used  Substance and Sexual Activity  . Alcohol use: Yes    Alcohol/week: 0.0 standard drinks    Comment: socially, once / month  . Drug use: No  . Sexual activity: Yes    Partners: Male    Birth control/protection: Post-menopausal    Comment: 1st intercourse- 37, married- 50 yrs  Other Topics Concern  . Not on file  Social History Narrative  . Not on file   Social Determinants of Health   Financial Resource Strain: Low Risk   . Difficulty of Paying Living Expenses: Not hard at all  Food Insecurity: No Food Insecurity  . Worried About Charity fundraiser in the Last Year: Never true  . Ran Out of Food in the Last Year: Never true  Transportation Needs: No Transportation Needs  . Lack of Transportation (Medical): No  . Lack of Transportation (Non-Medical): No  Physical Activity: Inactive  . Days of  Exercise per Week: 0 days  . Minutes of Exercise per Session: 0 min  Stress: No Stress Concern Present  . Feeling of Stress : Not at all  Social Connections: Socially Integrated  . Frequency of Communication with Friends and Family: More than three times a week  . Frequency of Social Gatherings with Friends and Family: Once a week  . Attends Religious Services: More than 4 times per year  . Active Member of Clubs or Organizations: No  . Attends Archivist Meetings: More than 4 times per year  . Marital Status: Married   Family History  Problem Relation Age of Onset  . Stroke Father 77  . Arthritis Mother        OA  . Hypertension Mother   . Hypothyroidism Mother   . Stroke Mother 95       ischemic  . Heart attack Sister 75       smoker  . Ovarian cancer Sister   . Osteopenia  Sister   . Hypertrophic cardiomyopathy Sister   . COPD Sister   . Heart failure Sister   . Hypothyroidism Sister   . Cardiomyopathy Sister   . Emphysema Sister   . Diabetes Maternal Grandmother   . Colon cancer Neg Hx   . Esophageal cancer Neg Hx   . Liver disease Neg Hx   . Stomach cancer Neg Hx   . Rectal cancer Neg Hx    Allergies  Allergen Reactions  . Dexilant [Dexlansoprazole]     REACTION: pressure in ears  . Prevacid [Lansoprazole]     REACTION: pressure in ears  . Prilosec [Omeprazole]     REACTION: pressure in ears   Prior to Admission medications   Medication Sig Start Date End Date Taking? Authorizing Provider  Cholecalciferol (VITAMIN D3) 25 MCG (1000 UT) CAPS Take 1 capsule (1,000 Units total) by mouth daily. 09/13/20   Binnie Rail, MD  Multiple Vitamin (MULTIVITAMIN) capsule Take 1 capsule by mouth daily. 09/13/20   Binnie Rail, MD  Naproxen Sodium 220 MG CAPS Take 1 capsule by mouth daily as needed.     [provider]  pantoprazole (PROTONIX) 40 MG tablet Take 40 mg by mouth daily.    [provider]  vitamin B-12 (CYANOCOBALAMIN) 1000 MCG tablet Take 1 tablet (1,000 mcg total) by mouth daily. 09/13/20   Binnie Rail, MD     Positive ROS: Otherwise negative  All other systems have been reviewed and were otherwise negative with the exception of those mentioned in the HPI and as above.  Physical Exam: Constitutional: Alert, well-appearing, no acute distress Ears: External ears without lesions or tenderness. Ear canals are clear bilaterally with intact, clear TMs bilaterally.  Left TM is clear with good mobility pneumatic otoscopy.  Hearing screening with the 512 1024 tuning fork revealed symmetric hearing in both ears with AC > BC. Nasal: External nose without lesions. Septum relatively midline with clear nasal passages bilaterally.  Both middle meatus regions were clear with no mucopurulent discharge.  No mucosal abnormalities noted..   Oral: Lips and gums without lesions. Tongue and palate mucosa without lesions. Posterior oropharynx clear. Neck: No palpable adenopathy or masses Respiratory: Breathing comfortably  Skin: No facial/neck lesions or rash noted.  Procedures  Assessment: Chronic rhinitis with no signs of active infection. Questionable eustachian tube dysfunction.  Plan: I discussed with her concerning using steam or warm saline rinse when the nose is bothersome when it is cold.  Hopefully  symptoms will improve with the warmer weather. She can also use Flonase 2 sprays each nostril which she already has that may help some. I discussed with her concerning no signs of infection.  Normal left TM on exam.  Radene Journey, MD

## 2020-09-19 DIAGNOSIS — K222 Esophageal obstruction: Secondary | ICD-10-CM | POA: Diagnosis not present

## 2020-09-19 DIAGNOSIS — K219 Gastro-esophageal reflux disease without esophagitis: Secondary | ICD-10-CM | POA: Diagnosis not present

## 2020-09-19 DIAGNOSIS — Z09 Encounter for follow-up examination after completed treatment for conditions other than malignant neoplasm: Secondary | ICD-10-CM | POA: Diagnosis not present

## 2020-09-19 DIAGNOSIS — K2270 Barrett's esophagus without dysplasia: Secondary | ICD-10-CM | POA: Diagnosis not present

## 2020-09-19 DIAGNOSIS — E669 Obesity, unspecified: Secondary | ICD-10-CM | POA: Diagnosis not present

## 2020-09-19 DIAGNOSIS — K449 Diaphragmatic hernia without obstruction or gangrene: Secondary | ICD-10-CM | POA: Diagnosis not present

## 2020-09-27 DIAGNOSIS — M25661 Stiffness of right knee, not elsewhere classified: Secondary | ICD-10-CM | POA: Diagnosis not present

## 2020-09-27 DIAGNOSIS — M25561 Pain in right knee: Secondary | ICD-10-CM | POA: Diagnosis not present

## 2020-09-27 DIAGNOSIS — M1711 Unilateral primary osteoarthritis, right knee: Secondary | ICD-10-CM | POA: Diagnosis not present

## 2020-10-06 DIAGNOSIS — R791 Abnormal coagulation profile: Secondary | ICD-10-CM | POA: Diagnosis not present

## 2020-10-06 DIAGNOSIS — Z01818 Encounter for other preprocedural examination: Secondary | ICD-10-CM | POA: Diagnosis not present

## 2020-10-31 DIAGNOSIS — M1711 Unilateral primary osteoarthritis, right knee: Secondary | ICD-10-CM | POA: Diagnosis not present

## 2020-10-31 DIAGNOSIS — G8918 Other acute postprocedural pain: Secondary | ICD-10-CM | POA: Diagnosis not present

## 2020-10-31 DIAGNOSIS — M170 Bilateral primary osteoarthritis of knee: Secondary | ICD-10-CM | POA: Diagnosis not present

## 2020-10-31 DIAGNOSIS — M1712 Unilateral primary osteoarthritis, left knee: Secondary | ICD-10-CM | POA: Diagnosis not present

## 2020-10-31 HISTORY — PX: REPLACEMENT TOTAL KNEE: SUR1224

## 2020-11-02 DIAGNOSIS — M25561 Pain in right knee: Secondary | ICD-10-CM | POA: Diagnosis not present

## 2020-11-06 DIAGNOSIS — M25561 Pain in right knee: Secondary | ICD-10-CM | POA: Diagnosis not present

## 2020-11-10 DIAGNOSIS — M25561 Pain in right knee: Secondary | ICD-10-CM | POA: Diagnosis not present

## 2020-11-13 DIAGNOSIS — Z4789 Encounter for other orthopedic aftercare: Secondary | ICD-10-CM | POA: Diagnosis not present

## 2020-11-13 DIAGNOSIS — M25561 Pain in right knee: Secondary | ICD-10-CM | POA: Diagnosis not present

## 2020-11-16 DIAGNOSIS — M25561 Pain in right knee: Secondary | ICD-10-CM | POA: Diagnosis not present

## 2020-11-21 DIAGNOSIS — M25561 Pain in right knee: Secondary | ICD-10-CM | POA: Diagnosis not present

## 2020-11-23 DIAGNOSIS — M25561 Pain in right knee: Secondary | ICD-10-CM | POA: Diagnosis not present

## 2020-11-28 DIAGNOSIS — M25561 Pain in right knee: Secondary | ICD-10-CM | POA: Diagnosis not present

## 2020-11-29 ENCOUNTER — Other Ambulatory Visit: Payer: Self-pay | Admitting: Obstetrics & Gynecology

## 2020-11-29 DIAGNOSIS — Z1231 Encounter for screening mammogram for malignant neoplasm of breast: Secondary | ICD-10-CM

## 2020-11-30 DIAGNOSIS — M25561 Pain in right knee: Secondary | ICD-10-CM | POA: Diagnosis not present

## 2020-12-05 DIAGNOSIS — M25561 Pain in right knee: Secondary | ICD-10-CM | POA: Diagnosis not present

## 2020-12-07 DIAGNOSIS — M25561 Pain in right knee: Secondary | ICD-10-CM | POA: Diagnosis not present

## 2020-12-11 DIAGNOSIS — Z4789 Encounter for other orthopedic aftercare: Secondary | ICD-10-CM | POA: Diagnosis not present

## 2020-12-11 DIAGNOSIS — M25561 Pain in right knee: Secondary | ICD-10-CM | POA: Diagnosis not present

## 2020-12-14 DIAGNOSIS — D3131 Benign neoplasm of right choroid: Secondary | ICD-10-CM | POA: Diagnosis not present

## 2020-12-15 DIAGNOSIS — M25561 Pain in right knee: Secondary | ICD-10-CM | POA: Diagnosis not present

## 2021-01-10 ENCOUNTER — Ambulatory Visit: Payer: Medicare Other

## 2021-01-17 DIAGNOSIS — Z20822 Contact with and (suspected) exposure to covid-19: Secondary | ICD-10-CM | POA: Diagnosis not present

## 2021-01-22 ENCOUNTER — Ambulatory Visit (INDEPENDENT_AMBULATORY_CARE_PROVIDER_SITE_OTHER): Payer: Medicare Other | Admitting: Internal Medicine

## 2021-01-22 ENCOUNTER — Other Ambulatory Visit: Payer: Self-pay

## 2021-01-22 ENCOUNTER — Encounter: Payer: Self-pay | Admitting: Internal Medicine

## 2021-01-22 DIAGNOSIS — J0110 Acute frontal sinusitis, unspecified: Secondary | ICD-10-CM | POA: Diagnosis not present

## 2021-01-22 MED ORDER — FLUTICASONE PROPIONATE 50 MCG/ACT NA SUSP: 2.0000 | Freq: Every day | NASAL | 6 refills | Status: DC

## 2021-01-22 MED ORDER — AMOXICILLIN-POT CLAVULANATE 875-125 MG PO TABS: 1.0000 | ORAL_TABLET | Freq: Two times a day (BID) | ORAL | 1 refills | Status: DC

## 2021-01-22 NOTE — Progress Notes (Signed)
Subjective:    Patient ID: Toni [name], female    DOB: [birth year], 75 y.o.   MRN: [id number]  HPI The patient is here for an acute visit.   Started a little over on week.  Grandkids and husband has been sick.  She states nasal congestion, left-sided ear pain, mild nosebleeds from the right side, sinus pain on the left side maxillary area, mild sore throat, some shortness of breath with exertion that she noticed today and headaches.  She has not had any cough, wheeze or fevers.     Medications and allergies reviewed with patient and updated if appropriate.  Patient Active Problem List   Diagnosis Date Noted   Preop examination 09/12/2020   Hearing loss of left ear 01/12/2018   Vertigo 12/07/2017   Osteopenia 09/23/2016   Gastric dysplasia 04/08/2016   Sinusitis 06/08/2015   Other abnormal glucose 02/10/2014   SVT (supraventricular tachycardia) ([place]) 06/08/2013   PVC's (premature ventricular contractions) 07/28/2011   Hyperlipidemia 04/12/2010   AORTIC SCLEROSIS 04/12/2010   COLONIC POLYPS, HX OF 04/12/2010   ESOPHAGEAL STRICTURE 05/29/2009   GERD 05/29/2009   DIVERTICULOSIS, COLON 05/29/2009    Current Outpatient Medications on File Prior to Visit  Medication Sig Dispense Refill   Cholecalciferol (VITAMIN D3) 25 MCG (1000 UT) CAPS Take 1 capsule (1,000 Units total) by mouth daily. 60 capsule    methocarbamol (ROBAXIN) 500 MG tablet Take 500 mg by mouth 4 (four) times daily.     Multiple Vitamin (MULTIVITAMIN) capsule Take 1 capsule by mouth daily.     Naproxen Sodium 220 MG CAPS Take 1 capsule by mouth daily as needed.      oxyCODONE (OXY IR/ROXICODONE) 5 MG immediate release tablet Take 5 mg by mouth every 4 (four) hours.     pantoprazole (PROTONIX) 40 MG tablet Take 40 mg by mouth daily.     vitamin B-12 (CYANOCOBALAMIN) 1000 MCG tablet Take 1 tablet (1,000 mcg total) by mouth daily.     No current facility-administered medications on file prior to visit.     Past Medical History:  Diagnosis Date   Aortic sclerosis 2000   on ECHO   Arthritis    knees   Colon polyps    Dr Sharlett Iles   Diverticulosis of colon (without mention of hemorrhage)    Esophageal reflux    Eye abnormality    OD ocular freckle   H/O hiatal hernia    Hyperlipidemia 1999   LDL 158, resolved with diet   Premature ventricular contractions    Stricture and stenosis of esophagus    dilation X 3   SVT (supraventricular tachycardia) (HCC)    slow pathway ablated    Past Surgical History:  Procedure Laterality Date   ANKLE SURGERY     right   COLONOSCOPY  10/14/2014   pyrtle   COLONOSCOPY W/ POLYPECTOMY  2011   Dr Sharlett Iles   KNEE ARTHROSCOPY     bilateral   ovarian wedge resection     to allow pregnancy   SUPRAVENTRICULAR TACHYCARDIA ABLATION N/A 06/08/2013   Procedure: SUPRAVENTRICULAR TACHYCARDIA ABLATION;  Surgeon: Coralyn Mark, MD;  Location: [facility] CATH LAB;  Service: Cardiovascular;  Laterality: N/A;   TONSILLECTOMY     UPPER GASTROINTESTINAL ENDOSCOPY     [facility] yearly   wisdom teeth ext      Social History   Socioeconomic History   Marital status: Married    Spouse name: Not on file   Number of  children: 2   Years of education: Not on file   Highest education level: Not on file  Occupational History   Occupation: Producer, 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[employer]  Tobacco Use   Smoking status: Never   Smokeless tobacco: Never  Vaping Use   Vaping Use: Never used  Substance and Sexual Activity   Alcohol use: Yes    Alcohol/week: 0.0 standard drinks    Comment: socially, once / month   Drug use: No   Sexual activity: Yes    Partners: Male    Birth control/protection: Post-menopausal    Comment: 1st intercourse- 64, married- 47 yrs  Other Topics Concern   Not on file  Social History Narrative   Not on file   Social Determinants of Health   Financial Resource Strain: Low Risk    Difficulty of Paying Living Expenses: Not hard at  all  Food Insecurity: No Food Insecurity   Worried About Charity fundraiser in the Last Year: Never true   Arboriculturist in the Last Year: Never true  Transportation Needs: No Transportation Needs   Lack of Transportation (Medical): No   Lack of Transportation (Non-Medical): No  Physical Activity: Inactive   Days of Exercise per Week: 0 days   Minutes of Exercise per Session: 0 min  Stress: No Stress Concern Present   Feeling of Stress : Not at all  Social Connections: Socially Integrated   Frequency of Communication with Friends and Family: More than three times a week   Frequency of Social Gatherings with Friends and Family: Once a week   Attends Religious Services: More than 4 times per year   Active Member of [employer] or Organizations: No   Attends Music therapist: More than 4 times per year   Marital Status: Married    Family History  Problem Relation Age of Onset   Stroke Father 59   Arthritis Mother        OA   Hypertension Mother    Hypothyroidism Mother    Stroke Mother 77       ischemic   Heart attack Sister 2       smoker   Ovarian cancer Sister    Osteopenia Sister    Hypertrophic cardiomyopathy Sister    COPD Sister    Heart failure Sister    Hypothyroidism Sister    Cardiomyopathy Sister    Emphysema Sister    Diabetes Maternal Grandmother    Colon cancer Neg Hx    Esophageal cancer Neg Hx    Liver disease Neg Hx    Stomach cancer Neg Hx    Rectal cancer Neg Hx     Review of Systems  Constitutional:  Negative for fever.  HENT:  Positive for congestion, ear pain, nosebleeds (right side), sinus pain (left side) and sore throat.   Respiratory:  Positive for shortness of breath (mild with exertion). Negative for cough and wheezing.   Neurological:  Positive for headaches (left side). Negative for dizziness and light-headedness.      Objective:   Vitals:   01/22/21 1545  BP: (!) 142/76  Pulse: 95  Temp: 99.3 F (37.4 C)  SpO2: 98%    BP Readings from Last 3 Encounters:  01/22/21 (!) 142/76  09/13/20 140/82  12/06/19 129/72   Wt Readings from Last 3 Encounters:  01/22/21 168 lb (76.2 kg)  09/13/20 173 lb 6.4 oz (78.7 kg)  12/06/19 177 lb (80.3 kg)   Body  mass index is 30.73 kg/m.   Physical Exam    GENERAL APPEARANCE: Appears stated age, well appearing, NAD EYES: conjunctiva clear, no icterus HENT: Right ear canal and tympanic membrane normal, left ear canal normal, bulging and slightly erythematous left tympanic membrane, oropharynx with no erythema or exudates, trachea midline, no cervical or supraclavicular lymphadenopathy LUNGS: Unlabored breathing, good air entry bilaterally, clear to auscultation without wheeze or crackles CARDIOVASCULAR: Normal S1,S2 , no edema SKIN: Warm, dry      Assessment & Plan:    See Problem List for Assessment and Plan of chronic medical problems.    This visit occurred during the SARS-CoV-2 public health emergency.  Safety protocols were in place, including screening questions prior to the visit, additional usage of staff PPE, and extensive cleaning of exam room while observing appropriate contact time as indicated for disinfecting solutions.

## 2021-01-22 NOTE — Patient Instructions (Addendum)
     Medications changes include :   augmentin 875-125 mg twice daily for 10 days.   Your prescription(s) have been submitted to your pharmacy. Please take as directed and contact our office if you believe you are having problem(s) with the medication(s).     Please call if there is no improvement in your symptoms.

## 2021-01-22 NOTE — Assessment & Plan Note (Signed)
Acute Likely bacterial  Start Augmentin 875-125 mg BID x 10 day Use Flonase daily as needed-discussed this could increase her nosebleeds otc cold medications Rest, fluid Call if no improvement

## 2021-01-25 ENCOUNTER — Encounter: Payer: Self-pay | Admitting: Internal Medicine

## 2021-01-25 DIAGNOSIS — R300 Dysuria: Secondary | ICD-10-CM

## 2021-01-26 ENCOUNTER — Other Ambulatory Visit (INDEPENDENT_AMBULATORY_CARE_PROVIDER_SITE_OTHER): Payer: Medicare Other

## 2021-01-26 DIAGNOSIS — R300 Dysuria: Secondary | ICD-10-CM | POA: Diagnosis not present

## 2021-01-26 LAB — URINALYSIS, ROUTINE W REFLEX MICROSCOPIC
Bilirubin Urine: NEGATIVE
Ketones, ur: NEGATIVE
Leukocytes,Ua: NEGATIVE
Nitrite: NEGATIVE
Specific Gravity, Urine: 1.025 (ref 1.000–1.030)
Total Protein, Urine: NEGATIVE
Urine Glucose: NEGATIVE
Urobilinogen, UA: 0.2 (ref 0.0–1.0)
pH: 5.5 (ref 5.0–8.0)

## 2021-01-27 LAB — URINE CULTURE: Result:: NO GROWTH

## 2021-01-30 ENCOUNTER — Encounter: Payer: Self-pay | Admitting: Internal Medicine

## 2021-01-31 ENCOUNTER — Ambulatory Visit (INDEPENDENT_AMBULATORY_CARE_PROVIDER_SITE_OTHER): Payer: Medicare Other | Admitting: Nurse Practitioner

## 2021-01-31 ENCOUNTER — Encounter: Payer: Self-pay | Admitting: Nurse Practitioner

## 2021-01-31 ENCOUNTER — Encounter: Payer: Self-pay | Admitting: Internal Medicine

## 2021-01-31 ENCOUNTER — Other Ambulatory Visit: Payer: Self-pay

## 2021-01-31 VITALS — BP 122/82

## 2021-01-31 DIAGNOSIS — N9089 Other specified noninflammatory disorders of vulva and perineum: Secondary | ICD-10-CM

## 2021-01-31 DIAGNOSIS — B373 Candidiasis of vulva and vagina: Secondary | ICD-10-CM

## 2021-01-31 DIAGNOSIS — B3731 Acute candidiasis of vulva and vagina: Secondary | ICD-10-CM

## 2021-01-31 LAB — WET PREP FOR TRICH, YEAST, CLUE

## 2021-01-31 MED ORDER — FLUCONAZOLE 150 MG PO TABS: 150.0000 mg | ORAL_TABLET | ORAL | 0 refills | Status: DC

## 2021-01-31 MED ORDER — METHYLPREDNISOLONE 4 MG PO TBPK: ORAL_TABLET | ORAL | 0 refills | Status: DC

## 2021-01-31 NOTE — Progress Notes (Signed)
   Acute Office Visit  Subjective:    Patient ID: Toni [name], female    DOB: Oct 18, 1945, 75 y.o.   MRN: [id number]   HPI 75 y.o. presents today for vulvar irritation and vaginal pressure. Was seen by PCP 01/26/2021 with complaints of burning with urination, negative UA at that time. Had recent knee surgery and suffered from constipation during recovery. She is also on day 10 of antibiotics for sinus infection. She is going on a cruise this weekend.    Review of Systems  Constitutional: Negative.   Genitourinary:  Positive for dysuria and vaginal pain (vulvar irritation/itching). Negative for frequency, genital sores, hematuria, urgency, vaginal bleeding and vaginal discharge.      Objective:    Physical Exam Constitutional:      Appearance: Normal appearance.  Genitourinary:    Vagina: Normal.     Cervix: Normal.     Uterus: Normal.      Comments: Mild vulvar redness, especially near introitus   BP 122/82   LMP 05/27/2006 (LMP Unknown)  Wt Readings from Last 3 Encounters:  01/22/21 168 lb (76.2 kg)  09/13/20 173 lb 6.4 oz (78.7 kg)  12/06/19 177 lb (80.3 kg)   Wet prep negative     Assessment & Plan:   Problem List Items Addressed This Visit   None Visit Diagnoses     Vulvar irritation    -  Primary   Relevant Medications   fluconazole (DIFLUCAN) 150 MG tablet   Other Relevant Orders   WET PREP FOR TRICH, YEAST, CLUE   Vulvar candidiasis       Relevant Medications   fluconazole (DIFLUCAN) 150 MG tablet      Plan: Will treat for yeast due to current antibiotic use and leaving for 2 week cruise this weekend - Diflucan 150 mg today and repeat in 3 days for total of 2 doses. Also recommend using A & D ointment and applying moisturizer such as coconut oil for vaginal dryness. She is agreeable to plan.      Tamela Gammon DNP, 9:46 AM 01/31/2021

## 2021-02-20 ENCOUNTER — Ambulatory Visit
Admission: RE | Admit: 2021-02-20 | Discharge: 2021-02-20 | Disposition: A | Discharge status: Home / Self Care | Payer: Medicare Other | Source: Ambulatory Visit | Attending: Obstetrics & Gynecology | Admitting: Obstetrics & Gynecology

## 2021-02-20 ENCOUNTER — Other Ambulatory Visit: Payer: Self-pay

## 2021-02-20 DIAGNOSIS — Z1231 Encounter for screening mammogram for malignant neoplasm of breast: Secondary | ICD-10-CM

## 2021-02-21 DIAGNOSIS — Z96651 Presence of right artificial knee joint: Secondary | ICD-10-CM | POA: Diagnosis not present

## 2021-02-21 DIAGNOSIS — M25562 Pain in left knee: Secondary | ICD-10-CM | POA: Diagnosis not present

## 2021-03-07 DIAGNOSIS — Z23 Encounter for immunization: Secondary | ICD-10-CM | POA: Diagnosis not present

## 2021-03-09 ENCOUNTER — Ambulatory Visit: Payer: Medicare Other | Admitting: Nurse Practitioner

## 2021-04-16 ENCOUNTER — Encounter: Payer: Self-pay | Admitting: Obstetrics & Gynecology

## 2021-04-16 ENCOUNTER — Other Ambulatory Visit (HOSPITAL_COMMUNITY)
Admission: RE | Admit: 2021-04-16 | Discharge: 2021-04-16 | Disposition: A | Discharge status: Home / Self Care | Payer: Medicare Other | Source: Ambulatory Visit | Attending: Obstetrics & Gynecology | Admitting: Obstetrics & Gynecology

## 2021-04-16 ENCOUNTER — Other Ambulatory Visit: Payer: Self-pay

## 2021-04-16 ENCOUNTER — Ambulatory Visit (INDEPENDENT_AMBULATORY_CARE_PROVIDER_SITE_OTHER): Payer: Medicare Other | Admitting: Obstetrics & Gynecology

## 2021-04-16 VITALS — BP 120/73 | HR 76 | Resp 16 | Ht 62.25 in | Wt 168.0 lb

## 2021-04-16 DIAGNOSIS — Z9189 Other specified personal risk factors, not elsewhere classified: Secondary | ICD-10-CM

## 2021-04-16 DIAGNOSIS — M85852 Other specified disorders of bone density and structure, left thigh: Secondary | ICD-10-CM

## 2021-04-16 DIAGNOSIS — Z01419 Encounter for gynecological examination (general) (routine) without abnormal findings: Secondary | ICD-10-CM | POA: Diagnosis not present

## 2021-04-16 DIAGNOSIS — Z780 Asymptomatic menopausal state: Secondary | ICD-10-CM

## 2021-04-16 NOTE — Progress Notes (Signed)
Toni [name] 07-10-1945 [id number]   History:    75 y.o. G2P2L2 Married.  Retired, worked at [employer].  Enjoys traveling.   RP:  Established patient presenting for annual gyn exam    HPI: Postmenopause, well on no HRT.  No PMB.  No pelvic pain. Pap Neg 12/2018.  Breasts normal with no lump.  Mammo Neg (/2022.  Urine and bowel movements normal.  BD 12/2018 Osteopenia Left Femoral Neck -2.0.  BMI 30.48.  Health labs with family physician.  Colono 11/2019.  Had a Rt knee replacement 6 months ago.    Past medical history,surgical history, family history and social history were all reviewed and documented in the EPIC chart.  Gynecologic History Patient's last menstrual period was 05/27/2006 (lmp unknown).  Obstetric History OB History  Gravida Para Term Preterm AB Living  2 2       2   SAB IAB Ectopic Multiple Live Births               # Outcome Date GA Lbr Len/2nd Weight Sex Delivery Anes PTL Lv  2 Para           1 Para              ROS: A ROS was performed and pertinent positives and negatives are included in the history.  GENERAL: No fevers or chills. HEENT: No change in vision, no earache, sore throat or sinus congestion. NECK: No pain or stiffness. CARDIOVASCULAR: No chest pain or pressure. No palpitations. PULMONARY: No shortness of breath, cough or wheeze. GASTROINTESTINAL: No abdominal pain, nausea, vomiting or diarrhea, melena or bright red blood per rectum. GENITOURINARY: No urinary frequency, urgency, hesitancy or dysuria. MUSCULOSKELETAL: No joint or muscle pain, no back pain, no recent trauma. DERMATOLOGIC: No rash, no itching, no lesions. ENDOCRINE: No polyuria, polydipsia, no heat or cold intolerance. No recent change in weight. HEMATOLOGICAL: No anemia or easy bruising or bleeding. NEUROLOGIC: No headache, seizures, numbness, tingling or weakness. PSYCHIATRIC: No depression, no loss of interest in normal activity or change in sleep pattern.     Exam:   BP 120/73    Pulse 76   Resp 16   Ht 5' 2.25" (1.581 m)   Wt 168 lb (76.2 kg)   LMP 05/27/2006 (LMP Unknown)   BMI 30.48 kg/m   Body mass index is 30.48 kg/m.  General appearance : Well developed well nourished female. No acute distress HEENT: Eyes: no retinal hemorrhage or exudates,  Neck supple, trachea midline, no carotid bruits, no thyroidmegaly Lungs: Clear to auscultation, no rhonchi or wheezes, or rib retractions  Heart: Regular rate and rhythm, no murmurs or gallops Breast:Examined in sitting and supine position were symmetrical in appearance, no palpable masses or tenderness,  no skin retraction, no nipple inversion, no nipple discharge, no skin discoloration, no axillary or supraclavicular lymphadenopathy Abdomen: no palpable masses or tenderness, no rebound or guarding Extremities: no edema or skin discoloration or tenderness  Pelvic: Vulva: Normal             Vagina: No gross lesions or discharge  Cervix: No gross lesions or discharge.  Pap reflex done.  Uterus  AV, normal size, shape and consistency, non-tender and mobile  Adnexa  Without masses or tenderness  Anus: Normal   Assessment/Plan:  75 y.o. female for annual exam   1. Encounter for routine gynecological examination with Papanicolaou smear of cervix Postmenopause, well on no HRT.  No PMB.  No pelvic pain. Pap Neg  12/2018.  Pap reflex done today. Breasts normal with no lump.  Mammo Neg (/2022.  Urine and bowel movements normal.  BD 12/2018 Osteopenia Left Femoral Neck -2.0.  BMI 30.48.  Health labs with family physician.  Colono 11/2019.  Had a Rt knee replacement 6 months ago. - Cytology - PAP( [facility])  2. At risk for fracture  3. Postmenopause Postmenopause, well on no HRT.  No PMB.   4. 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  Will repeat BD at [facility].  Vit D, Ca++ 1.5 g/d total, regular weight bearing physical activities as her Lt knee recovers post replacement.  Other  orders - Glucosamine-Chondroitin (MOVE FREE PO); Take by mouth.   Princess Bruins MD, 10:11 AM 04/16/2021

## 2021-04-23 LAB — CYTOLOGY - PAP: Diagnosis: NEGATIVE

## 2021-05-10 DIAGNOSIS — U071 COVID-19: Secondary | ICD-10-CM | POA: Diagnosis not present

## 2021-06-06 DIAGNOSIS — M170 Bilateral primary osteoarthritis of knee: Secondary | ICD-10-CM | POA: Diagnosis not present

## 2021-06-06 DIAGNOSIS — Z96651 Presence of right artificial knee joint: Secondary | ICD-10-CM | POA: Diagnosis not present

## 2021-06-06 DIAGNOSIS — M1712 Unilateral primary osteoarthritis, left knee: Secondary | ICD-10-CM | POA: Diagnosis not present

## 2021-06-06 DIAGNOSIS — M7631 Iliotibial band syndrome, right leg: Secondary | ICD-10-CM | POA: Diagnosis not present

## 2021-07-16 DIAGNOSIS — M1712 Unilateral primary osteoarthritis, left knee: Secondary | ICD-10-CM | POA: Diagnosis not present

## 2021-07-17 DIAGNOSIS — M1712 Unilateral primary osteoarthritis, left knee: Secondary | ICD-10-CM | POA: Insufficient documentation

## 2021-07-23 DIAGNOSIS — M1712 Unilateral primary osteoarthritis, left knee: Secondary | ICD-10-CM | POA: Diagnosis not present

## 2021-07-30 DIAGNOSIS — M1712 Unilateral primary osteoarthritis, left knee: Secondary | ICD-10-CM | POA: Diagnosis not present

## 2021-08-16 ENCOUNTER — Encounter: Payer: Self-pay | Admitting: Internal Medicine

## 2021-08-16 NOTE — Patient Instructions (Addendum)
? ? ? ?  Blood work was ordered.   ? ? ?Medications changes include :   None ? ? ?Your prescription(s) have been sent to your pharmacy.  ? ? ?A referral was ordered for XX.     Someone from that office will call you to schedule an appointment.  ? ? ?Return in about 1 year (around 08/18/2022) for follow up. ? ?

## 2021-08-16 NOTE — Progress Notes (Signed)
? ? ? ? ?Subjective:  ? ? Patient ID: Toni [name], female    DOB: 01-29-1946, 76 y.o.   MRN: [id number] ? ?This visit occurred during the SARS-CoV-2 public health emergency.  Safety protocols were in place, including screening questions prior to the visit, additional usage of staff PPE, and extensive cleaning of exam room while observing appropriate contact time as indicated for disinfecting solutions.   ? ? ?HPI ?Toni [name] is here for follow up of her chronic medical problems, including GERD, hyperlipidemia, osteopenia ? ?Has a cold  - covid test negative.  It started one week ago.   ? ?Her right knee is healing.   ? ?Doing yard work, swimming.  Will start machines.   ? ?Medications and allergies reviewed with patient and updated if appropriate. ? ?Current Outpatient Medications on File Prior to Visit  ?Medication Sig Dispense Refill  ? diclofenac Sodium (VOLTAREN) 1 % GEL Voltaren Arthritis Pain 1 % topical gel ? APPLY 2 GRAMS TO THE AFFECTED AREA(S) BY TOPICAL ROUTE 4 TIMES PER DAY    ? Glucosamine-Chondroitin (MOVE FREE PO) Take by mouth.    ? methocarbamol (ROBAXIN) 500 MG tablet Take 500 mg by mouth 4 (four) times daily.    ? Multiple Vitamin (MULTIVITAMIN) capsule Take 1 capsule by mouth daily.    ? Naproxen Sodium 220 MG CAPS Take 1 capsule by mouth daily as needed.     ? pantoprazole (PROTONIX) 40 MG tablet Take 40 mg by mouth daily.    ? vitamin B-12 (CYANOCOBALAMIN) 1000 MCG tablet Take 1 tablet (1,000 mcg total) by mouth daily.    ? ?No current facility-administered medications on file prior to visit.  ? ? ? ?Review of Systems  ?Constitutional:  Negative for chills and fever.  ?HENT:  Positive for congestion, nosebleeds (with cold) and sore throat. Negative for ear pain, sinus pressure and sinus pain.   ?Eyes:  Negative for visual disturbance.  ?Respiratory:  Positive for cough (dry). Negative for shortness of breath and wheezing.   ?Cardiovascular:  Negative for chest pain, palpitations and leg  swelling.  ?Gastrointestinal:  Negative for abdominal pain, blood in stool, constipation, diarrhea and nausea.  ?     Toni [name] controlled  ?Musculoskeletal:  Positive for arthralgias.  ?Neurological:  Positive for headaches (from cold). Negative for light-headedness.  ?Psychiatric/Behavioral:  Negative for dysphoric mood. The patient is not nervous/anxious.   ? ?   ?Objective:  ? ?Vitals:  ? 08/17/21 0932  ?BP: 120/76  ?Pulse: 80  ?Temp: 98.4 ?F (36.9 ?C)  ?SpO2: 97%  ? ?BP Readings from Last 3 Encounters:  ?08/17/21 120/76  ?04/16/21 120/73  ?01/31/21 122/82  ? ?Wt Readings from Last 3 Encounters:  ?08/17/21 171 lb 3.2 oz (77.7 kg)  ?04/16/21 168 lb (76.2 kg)  ?01/22/21 168 lb (76.2 kg)  ? ?Body mass index is 31.06 kg/m?. ? ?  ?Physical Exam ?   ? ?Lab Results  ?Component Value Date  ? WBC 5.7 09/13/2020  ? HGB 13.6 09/13/2020  ? HCT 40.1 09/13/2020  ? PLT 251.0 09/13/2020  ? GLUCOSE 85 09/13/2020  ? CHOL 193 09/13/2020  ? TRIG 82.0 09/13/2020  ? HDL 68.20 09/13/2020  ? LDLDIRECT 135.1 08/25/2012  ? LDLCALC 109 (H) 09/13/2020  ? ALT 10 09/13/2020  ? AST 12 09/13/2020  ? NA 142 09/13/2020  ? K 4.2 09/13/2020  ? CL 105 09/13/2020  ? CREATININE 0.76 09/13/2020  ? BUN 16 09/13/2020  ? CO2 29 09/13/2020  ?  TSH 2.50 12/05/2017  ? HGBA1C 5.1 09/13/2020  ? ? ? ?The 10-year ASCVD risk score (Arnett DK, et al., 2019) is: 12.7% ?  Values used to calculate the score: ?    Age: 35 years ?    Sex: Female ?    Is Non-Hispanic African American: No ?    Diabetic: No ?    Tobacco smoker: No ?    Systolic Blood Pressure: 818 mmHg ?    Is BP treated: No ?    HDL Cholesterol: 68.2 mg/dL ?    Total Cholesterol: 193 mg/dL ? ? ?Assessment & Plan:  ? ? ?See Problem List for Assessment and Plan of chronic medical problems.  ? ? ?

## 2021-08-17 ENCOUNTER — Ambulatory Visit: Payer: Medicare Other

## 2021-08-17 ENCOUNTER — Other Ambulatory Visit: Payer: Self-pay

## 2021-08-17 ENCOUNTER — Ambulatory Visit (INDEPENDENT_AMBULATORY_CARE_PROVIDER_SITE_OTHER): Payer: Medicare Other | Admitting: Internal Medicine

## 2021-08-17 VITALS — BP 120/76 | HR 80 | Temp 98.4°F | Ht 62.25 in | Wt 171.2 lb

## 2021-08-17 DIAGNOSIS — J069 Acute upper respiratory infection, unspecified: Secondary | ICD-10-CM

## 2021-08-17 DIAGNOSIS — E782 Mixed hyperlipidemia: Secondary | ICD-10-CM | POA: Diagnosis not present

## 2021-08-17 DIAGNOSIS — K219 Gastro-esophageal reflux disease without esophagitis: Secondary | ICD-10-CM | POA: Diagnosis not present

## 2021-08-17 DIAGNOSIS — Z96651 Presence of right artificial knee joint: Secondary | ICD-10-CM | POA: Insufficient documentation

## 2021-08-17 DIAGNOSIS — R7309 Other abnormal glucose: Secondary | ICD-10-CM | POA: Diagnosis not present

## 2021-08-17 DIAGNOSIS — M85852 Other specified disorders of bone density and structure, left thigh: Secondary | ICD-10-CM | POA: Diagnosis not present

## 2021-08-17 LAB — CBC WITH DIFFERENTIAL/PLATELET
Basophils Absolute: 0 10*3/uL (ref 0.0–0.1)
Basophils Relative: 0.4 % (ref 0.0–3.0)
Eosinophils Absolute: 0.1 10*3/uL (ref 0.0–0.7)
Eosinophils Relative: 1.4 % (ref 0.0–5.0)
HCT: 37.6 % (ref 36.0–46.0)
Hemoglobin: 12.9 g/dL (ref 12.0–15.0)
Lymphocytes Relative: 22.3 % (ref 12.0–46.0)
Lymphs Abs: 1.4 10*3/uL (ref 0.7–4.0)
MCHC: 34.3 g/dL (ref 30.0–36.0)
MCV: 86.4 fl (ref 78.0–100.0)
Monocytes Absolute: 0.4 10*3/uL (ref 0.1–1.0)
Monocytes Relative: 5.8 % (ref 3.0–12.0)
Neutro Abs: 4.4 10*3/uL (ref 1.4–7.7)
Neutrophils Relative %: 70.1 % (ref 43.0–77.0)
Platelets: 222 10*3/uL (ref 150.0–400.0)
RBC: 4.35 Mil/uL (ref 3.87–5.11)
RDW: 13.6 % (ref 11.5–15.5)
WBC: 6.3 10*3/uL (ref 4.0–10.5)

## 2021-08-17 LAB — LIPID PANEL
Cholesterol: 152 mg/dL (ref 0–200)
HDL: 54.7 mg/dL (ref >39.00)
LDL Cholesterol: 81 mg/dL (ref 0–99)
NonHDL: 97.19
Total CHOL/HDL Ratio: 3
Triglycerides: 79 mg/dL (ref 0.0–149.0)
VLDL: 15.8 mg/dL (ref 0.0–40.0)

## 2021-08-17 LAB — COMPREHENSIVE METABOLIC PANEL
ALT: 10 U/L (ref 0–35)
AST: 14 U/L (ref 0–37)
Albumin: 4 g/dL (ref 3.5–5.2)
Alkaline Phosphatase: 74 U/L (ref 39–117)
BUN: 13 mg/dL (ref 6–23)
CO2: 30 mEq/L (ref 19–32)
Calcium: 8.8 mg/dL (ref 8.4–10.5)
Chloride: 108 mEq/L (ref 96–112)
Creatinine, Ser: 0.65 mg/dL (ref 0.40–1.20)
GFR: 86.93 mL/min (ref >60.00)
Glucose, Bld: 88 mg/dL (ref 70–99)
Potassium: 3.7 mEq/L (ref 3.5–5.1)
Sodium: 143 mEq/L (ref 135–145)
Total Bilirubin: 0.4 mg/dL (ref 0.2–1.2)
Total Protein: 6.7 g/dL (ref 6.0–8.3)

## 2021-08-17 LAB — HEMOGLOBIN A1C: Hgb A1c MFr Bld: 5.3 % (ref 4.6–6.5)

## 2021-08-17 NOTE — Assessment & Plan Note (Signed)
Acute Symptoms likely viral in nature Continue symptomatic treatment with over-the-counter cold medications, Tylenol/ibuprofen Increase rest and fluids Call if symptoms worsen or do not improve  

## 2021-08-17 NOTE — Assessment & Plan Note (Signed)
Check A1c. 

## 2021-08-17 NOTE — Assessment & Plan Note (Signed)
Chronic ?Regular exercise and healthy diet encouraged ?Check lipid panel, CMP, TSH ?Continue lifestyle controlled ?

## 2021-08-17 NOTE — Assessment & Plan Note (Addendum)
Chronic ?DEXA up-to-date-due later this year ?Stressed regular exercise ?Taking multivitamin ?Check vitamin D level, CBC, CMP ?

## 2021-08-17 NOTE — Assessment & Plan Note (Signed)
Chronic ?GERD controlled ?No dysphagia ?Continue Protonix 40 mg daily ?

## 2021-08-20 ENCOUNTER — Ambulatory Visit: Payer: Medicare Other

## 2021-08-20 DIAGNOSIS — Z20822 Contact with and (suspected) exposure to covid-19: Secondary | ICD-10-CM | POA: Diagnosis not present

## 2021-08-21 LAB — VITAMIN D 25 HYDROXY (VIT D DEFICIENCY, FRACTURES): VITD: 39.01 ng/mL (ref 30.00–100.00)

## 2021-08-21 LAB — TSH: TSH: 1.59 u[IU]/mL (ref 0.35–5.50)

## 2021-08-22 ENCOUNTER — Ambulatory Visit (INDEPENDENT_AMBULATORY_CARE_PROVIDER_SITE_OTHER): Payer: Medicare Other

## 2021-08-22 DIAGNOSIS — Z0000 Encounter for general adult medical examination without abnormal findings: Secondary | ICD-10-CM | POA: Diagnosis not present

## 2021-08-22 NOTE — Patient Instructions (Signed)
Toni [name] , ?Thank you for taking time to come for your Medicare Wellness Visit. I appreciate your ongoing commitment to your health goals. Please review the following plan we discussed and let me know if I can assist you in the future.  ? ?Screening recommendations/referrals: ?Colonoscopy: 12/06/2019; per patient no  longer recommended due to age ?Mammogram: 02/20/2021; due every 1-2 years ?Bone Density: 01/04/2019; due every 3 years ?Recommended yearly ophthalmology/optometry visit for glaucoma screening and checkup ?Recommended yearly dental visit for hygiene and checkup ? ?Vaccinations: ?Influenza vaccine: 03/07/2021 ?Pneumococcal vaccine: 02/16/2014, 06/08/2015 ?Tdap vaccine: 02/26/2011; due every 10 years (overdue) ?Shingles vaccine: never done; patient will schedule with local pharmacy   ?Covid-19: 06/21/2019, 07/12/2019 ? ?Advanced directives: Please bring a copy of your health care power of attorney and living will to the office at your convenience. ? ?Conditions/risks identified: Yes ? ?Next appointment: Please schedule your next Medicare Wellness Visit with your Nurse Health Advisor in 1 year or 366 days by calling [phone]. ? ? ?Preventive Care 74 Years and Older, Female ?Preventive care refers to lifestyle choices and visits with your health care provider that can promote health and wellness. ?What does preventive care include? ?A yearly physical exam. This is also called an annual well check. ?Dental exams once or twice a year. ?Routine eye exams. Ask your health care provider how often you should have your eyes checked. ?Personal lifestyle choices, including: ?Daily care of your teeth and gums. ?Regular physical activity. ?Eating a healthy diet. ?Avoiding tobacco and drug use. ?Limiting alcohol use. ?Practicing safe sex. ?Taking low-dose aspirin every day. ?Taking vitamin and mineral supplements as recommended by your health care provider. ?What happens during an annual well check? ?The services and  screenings done by your health care provider during your annual well check will depend on your age, overall health, lifestyle risk factors, and family history of disease. ?Counseling  ?Your health care provider may ask you questions about your: ?Alcohol use. ?Tobacco use. ?Drug use. ?Emotional well-being. ?Home and relationship well-being. ?Sexual activity. ?Eating habits. ?History of falls. ?Memory and ability to understand (cognition). ?Work and work Statistician. ?Reproductive health. ?Screening  ?You may have the following tests or measurements: ?Height, weight, and BMI. ?Blood pressure. ?Lipid and cholesterol levels. These may be checked every 5 years, or more frequently if you are over 2 years old. ?Skin check. ?Lung cancer screening. You may have this screening every year starting at age 92 if you have a 30-pack-year history of smoking and currently smoke or have quit within the past 15 years. ?Fecal occult blood test (FOBT) of the stool. You may have this test every year starting at age 24. ?Flexible sigmoidoscopy or colonoscopy. You may have a sigmoidoscopy every 5 years or a colonoscopy every 10 years starting at age 44. ?Hepatitis C blood test. ?Hepatitis B blood test. ?Sexually transmitted disease (STD) testing. ?Diabetes screening. This is done by checking your blood sugar (glucose) after you have not eaten for a while (fasting). You may have this done every 1-3 years. ?Bone density scan. This is done to screen for osteoporosis. You may have this done starting at age 24. ?Mammogram. This may be done every 1-2 years. Talk to your health care provider about how often you should have regular mammograms. ?Talk with your health care provider about your test results, treatment options, and if necessary, the need for more tests. ?Vaccines  ?Your health care provider may recommend certain vaccines, such as: ?Influenza vaccine. This is recommended every year. ?  Tetanus, diphtheria, and acellular pertussis (Tdap,  Td) vaccine. You may need a Td booster every 10 years. ?Zoster vaccine. You may need this after age 85. ?Pneumococcal 13-valent conjugate (PCV13) vaccine. One dose is recommended after age 67. ?Pneumococcal polysaccharide (PPSV23) vaccine. One dose is recommended after age 60. ?Talk to your health care provider about which screenings and vaccines you need and how often you need them. ?This information is not intended to replace advice given to you by your health care provider. Make sure you discuss any questions you have with your health care provider. ?Document Released: 06/09/2015 Document Revised: 01/31/2016 Document Reviewed: 03/14/2015 ?Elsevier Interactive Patient Education ? 2017 [employer]. ? ?Fall Prevention in the Home ?Falls can cause injuries. They can happen to people of all ages. There are many things you can do to make your home safe and to help prevent falls. ?What can I do on the outside of my home? ?Regularly fix the edges of walkways and driveways and fix any cracks. ?Remove anything that might make you trip as you walk through a door, such as a raised step or threshold. ?Trim any bushes or trees on the path to your home. ?Use bright outdoor lighting. ?Clear any walking paths of anything that might make someone trip, such as rocks or tools. ?Regularly check to see if handrails are loose or broken. Make sure that both sides of any steps have handrails. ?Any raised decks and porches should have guardrails on the edges. ?Have any leaves, snow, or ice cleared regularly. ?Use sand or salt on walking paths during winter. ?Clean up any spills in your garage right away. This includes oil or grease spills. ?What can I do in the bathroom? ?Use night lights. ?Install grab bars by the toilet and in the tub and shower. Do not use towel bars as grab bars. ?Use non-skid mats or decals in the tub or shower. ?If you need to sit down in the shower, use a plastic, non-slip stool. ?Keep the floor dry. Clean up any  water that spills on the floor as soon as it happens. ?Remove soap buildup in the tub or shower regularly. ?Attach bath mats securely with double-sided non-slip rug tape. ?Do not have throw rugs and other things on the floor that can make you trip. ?What can I do in the bedroom? ?Use night lights. ?Make sure that you have a light by your bed that is easy to reach. ?Do not use any sheets or blankets that are too big for your bed. They should not hang down onto the floor. ?Have a firm chair that has side arms. You can use this for support while you get dressed. ?Do not have throw rugs and other things on the floor that can make you trip. ?What can I do in the kitchen? ?Clean up any spills right away. ?Avoid walking on wet floors. ?Keep items that you use a lot in easy-to-reach places. ?If you need to reach something above you, use a strong step stool that has a grab bar. ?Keep electrical cords out of the way. ?Do not use floor polish or wax that makes floors slippery. If you must use wax, use non-skid floor wax. ?Do not have throw rugs and other things on the floor that can make you trip. ?What can I do with my stairs? ?Do not leave any items on the stairs. ?Make sure that there are handrails on both sides of the stairs and use them. Fix handrails that are broken or loose.  Make sure that handrails are as long as the stairways. ?Check any carpeting to make sure that it is firmly attached to the stairs. Fix any carpet that is loose or worn. ?Avoid having throw rugs at the top or bottom of the stairs. If you do have throw rugs, attach them to the floor with carpet tape. ?Make sure that you have a light switch at the top of the stairs and the bottom of the stairs. If you do not have them, ask someone to add them for you. ?What else can I do to help prevent falls? ?Wear shoes that: ?Do not have high heels. ?Have rubber bottoms. ?Are comfortable and fit you well. ?Are closed at the toe. Do not wear sandals. ?If you use a  stepladder: ?Make sure that it is fully opened. Do not climb a closed stepladder. ?Make sure that both sides of the stepladder are locked into place. ?Ask someone to hold it for you, if possible. ?Clearly

## 2021-08-22 NOTE — Progress Notes (Signed)
?I connected with Toni [name] today by telephone and verified that I am speaking with the correct person using two identifiers. ?Location patient: home ?Location provider: work ?Persons participating in the virtual visit: patient, provider. ?  ?I discussed the limitations, risks, security and privacy concerns of performing an evaluation and management service by telephone and the availability of in person appointments. I also discussed with the patient that there may be a patient responsible charge related to this service. The patient expressed understanding and verbally consented to this telephonic visit.  ?  ?Interactive audio and video telecommunications were attempted between this provider and patient, however failed, due to patient having technical difficulties OR patient did not have access to video capability.  We continued and completed visit with audio only. ? ?Some vital signs may be absent or patient reported.  ? ?Time Spent with patient on telephone encounter: 10 minutes ? ?Subjective:  ? Toni [name] is a 76 y.o. female who presents for Medicare Annual (Subsequent) preventive examination. ? ?Review of Systems    ? ?Cardiac Risk Factors include: advanced age (>77mn, >>54women);family history of premature cardiovascular disease;obesity (BMI >30kg/m2) ? ?   ?Objective:  ?  ?There were no vitals filed for this visit. ?There is no height or weight on file to calculate BMI. ? ? ?  08/22/2021  ? 11:34 AM 08/10/2020  ?  2:07 PM 12/23/2018  ? 11:18 AM 11/04/2017  ?  9:30 AM 06/14/2016  ?  9:40 AM 02/13/2016  ?  9:06 AM 10/14/2014  ?  1:46 PM  ?Advanced Directives  ?Does Patient Have a Medical Advance Directive? Yes Yes Yes Yes No Yes Yes  ?Type of Advance Directive Living will;Healthcare Power of [place]  [place] Healthcare [place] of Attorney  ?Does patient want to make changes to medical  advance directive? No - Patient declined No - Patient declined       ?Copy of [place] Chart? No - copy requested  No - copy requested No - copy requested  No - copy requested   ? ? ?Current Medications (verified) ?Outpatient Encounter Medications as of 08/22/2021  ?Medication Sig  ? diclofenac Sodium (VOLTAREN) 1 % GEL Voltaren Arthritis Pain 1 % topical gel ? APPLY 2 GRAMS TO THE AFFECTED AREA(S) BY TOPICAL ROUTE 4 TIMES PER DAY  ? Glucosamine-Chondroitin (MOVE FREE PO) Take by mouth.  ? Multiple Vitamin (MULTIVITAMIN) capsule Take 1 capsule by mouth daily.  ? Naproxen Sodium 220 MG CAPS Take 1 capsule by mouth daily as needed.   ? pantoprazole (PROTONIX) 40 MG tablet Take 40 mg by mouth daily.  ? vitamin B-12 (CYANOCOBALAMIN) 1000 MCG tablet Take 1 tablet (1,000 mcg total) by mouth daily.  ? ?No facility-administered encounter medications on file as of 08/22/2021.  ? ? ?Allergies (verified) ?Dexilant [dexlansoprazole], Prevacid [lansoprazole], and Prilosec [omeprazole]  ? ?History: ?Past Medical History:  ?Diagnosis Date  ? Aortic sclerosis 2000  ? on ECHO  ? Arthritis   ? knees  ? Colon polyps   ? Dr PSharlett Iles ? Diverticulosis of colon (without mention of hemorrhage)   ? Esophageal reflux   ? Eye abnormality   ? OD ocular freckle  ? H/O hiatal hernia   ? Hyperlipidemia 1999  ? LDL 158, resolved with diet  ? Premature ventricular contractions   ? Stricture and stenosis of esophagus   ? dilation X 3  ?  SVT (supraventricular tachycardia) ([place])   ? slow pathway ablated  ? ?Past Surgical History:  ?Procedure Laterality Date  ? ANKLE SURGERY    ? right  ? COLONOSCOPY  10/14/2014  ? pyrtle  ? COLONOSCOPY W/ POLYPECTOMY  2011  ? Dr Sharlett Iles  ? KNEE ARTHROSCOPY    ? bilateral  ? ovarian wedge resection    ? to allow pregnancy  ? 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 SUPRAVENTRICULAR TACHYCARDIA ABLATION N/A 06/08/2013  ? Procedure: SUPRAVENTRICULAR TACHYCARDIA ABLATION;  Surgeon: Coralyn Mark, MD;  Location: [facility];  Service: Cardiovascular;  Laterality: N/A;  ? TONSILLECTOMY    ? UPPER GASTROINTESTINAL ENDOSCOPY    ? [facility] yearly  ? wisdom teeth ext    ? ?Family History  ?Problem Relation Age of Onset  ? Stroke Father 42  ? Arthritis Mother   ?     OA  ? Hypertension Mother   ? Hypothyroidism Mother   ? Stroke Mother 42  ?     ischemic  ? Heart attack Sister 77  ?     smoker  ? Ovarian cancer Sister   ? Osteopenia Sister   ? Hypertrophic cardiomyopathy Sister   ? COPD Sister   ? Heart failure Sister   ? Hypothyroidism Sister   ? Cardiomyopathy Sister   ? Emphysema Sister   ? Diabetes Maternal Grandmother   ? Colon cancer Neg Hx   ? Esophageal cancer Neg Hx   ? Liver disease Neg Hx   ? Stomach cancer Neg Hx   ? Rectal cancer Neg Hx   ? ?Social History  ? ?Socioeconomic History  ? Marital status: Married  ?  Spouse name: Not on file  ? Number of children: 2  ? Years of education: Not on file  ? Highest education level: Not on file  ?Occupational History  ? Occupation: Network engineer  ?  Employer: [employer]  ?Tobacco Use  ? Smoking status: Never  ? Smokeless tobacco: Never  ?Vaping Use  ? Vaping Use: Never used  ?Substance and Sexual Activity  ? Alcohol use: Not Currently  ? Drug use: No  ? Sexual activity: Yes  ?  Partners: Male  ?  Birth control/protection: Post-menopausal  ?  Comment: older than 16, less than 5  ?Other Topics Concern  ? Not on file  ?Social History Narrative  ? Not on file  ? ?Social Determinants of Health  ? ?Financial Resource Strain: Low Risk   ? Difficulty of Paying Living Expenses: Not hard at all  ?Food Insecurity: No Food Insecurity  ? Worried About Charity fundraiser in the Last Year: Never true  ? Ran Out of Food in the Last Year: Never true  ?Transportation Needs: No Transportation Needs  ? Lack of Transportation (Medical): No  ? Lack of Transportation (Non-Medical): No  ?Physical Activity: Sufficiently Active  ? Days of Exercise per Week: 5 days  ?  Minutes of Exercise per Session: 30 min  ?Stress: No Stress Concern Present  ? Feeling of Stress : Not at all  ?Social Connections: Socially Integrated  ? Frequency of Communication with Friends and Family: More than three times a week  ? Frequency of Social Gatherings with Friends and Family: More than three times a week  ? Attends Religious Services: More than 4 times per year  ? Active Member of Clubs or Organizations: No  ? Attends Archivist Meetings: More than 4 times per year  ?  Marital Status: Married  ? ? ?Tobacco Counseling ?Counseling given: Not Answered ? ? ?Clinical Intake: ? ?Pre-visit preparation completed: Yes ? ?Pain : No/denies pain ? ?  ? ?Nutritional Risks: None ?Diabetes: No ? ?How often do you need to have someone help you when you read instructions, pamphlets, or other written materials from your doctor or pharmacy?: 1 - Never ?What is the last grade level you completed in school?: [employer]; college classes ? ?Diabetic? no ? ?Interpreter Needed?: No ? ?Information entered by :: Lisette Abu, LPN ? ? ?Activities of Daily Living ? ?  08/22/2021  ? 11:45 AM  ?In your present state of health, do you have any difficulty performing the following activities:  ?Hearing? 0  ?Vision? 0  ?Difficulty concentrating or making decisions? 0  ?Walking or climbing stairs? 0  ?Dressing or bathing? 0  ?Doing errands, shopping? 0  ?Preparing Food and eating ? N  ?Using the Toilet? N  ?In the past six months, have you accidently leaked urine? N  ?Do you have problems with loss of bowel control? N  ?Managing your Medications? N  ?Managing your Finances? N  ?Housekeeping or managing your Housekeeping? N  ? ? ?Patient Care Team: ?Binnie Rail, MD as PCP - General (Internal Medicine) ?Thompson Grayer, MD as PCP - Cardiology (Cardiology) ?Juluis Rainier as Consulting Physician (Optometry) ?Pyrtle, Lajuan Lines, MD as Consulting Physician (Gastroenterology) ?Nash Dimmer, MD as Consulting Physician  (Gastroenterology) ? ?Indicate any recent Medical Services you may have received from other than [facility] providers in the past year (date may be approximate). ? ?   ?Assessment:  ? This is a routine wellness examination for [employer]

## 2021-08-24 ENCOUNTER — Ambulatory Visit (INDEPENDENT_AMBULATORY_CARE_PROVIDER_SITE_OTHER): Payer: Medicare Other | Admitting: Internal Medicine

## 2021-08-24 ENCOUNTER — Encounter: Payer: Self-pay | Admitting: Internal Medicine

## 2021-08-24 VITALS — BP 134/76 | HR 76 | Ht 62.0 in | Wt 173.6 lb

## 2021-08-24 DIAGNOSIS — R0602 Shortness of breath: Secondary | ICD-10-CM | POA: Diagnosis not present

## 2021-08-24 DIAGNOSIS — I493 Ventricular premature depolarization: Secondary | ICD-10-CM | POA: Diagnosis not present

## 2021-08-24 DIAGNOSIS — I471 Supraventricular tachycardia: Secondary | ICD-10-CM

## 2021-08-24 NOTE — Patient Instructions (Signed)
Medication Instructions:  ?Your physician recommends that you continue on your current medications as directed. Please refer to the Current Medication list given to you today. ? ?*If you need a refill on your cardiac medications before your next appointment, please call your pharmacy* ? ? ?Lab Work: ?None ordered ? ? ?Testing/Procedures: ?Your physician has requested that you have an echocardiogram. Echocardiography is a painless test that uses sound waves to create images of your heart. It provides your doctor with information about the size and shape of your heart and how well your heart?s chambers and valves are working. This procedure takes approximately one hour. There are no restrictions for this procedure. ? ? ?Follow-Up: ?At [facility], you and your health needs are our priority.  As part of our continuing mission to provide you with exceptional heart care, we have created designated Provider Care Teams.  These Care Teams include your primary Cardiologist (physician) and Advanced Practice Providers (APPs -  Physician Assistants and Nurse Practitioners) who all work together to provide you with the care you need, when you need it. ? ?Your next appointment:   ?1 year(s) ? ?The format for your next appointment:   ?In Person ? ?Provider:   ?Virl Axe, MD ? ? ? ?Thank you for choosing [facility]!! ? ? ?[phone] ? ? ? ?

## 2021-08-24 NOTE — Progress Notes (Signed)
? ? ? ? ?ELECTROPHYSIOLOGY CONSULT NOTE  ?Patient ID: Toni [name], MRN: [id number], DOB/AGE: June 01, 1945 76 y.o. ?Admit date: (Not on file) ?Date of Consult: 08/24/2021 ? ?Primary Physician: Binnie Rail, MD ?Primary Cardiologist: New ?  ?  ?Toni [name] is a 76 y.o. female who is being seen today for the evaluation of dyspnea on exertion at the request of Dr. Quay Burow.  ? ? ?HPI ?Toni [name] is a 76 y.o. female previously seen by Dr Greggory Brandy for SVT for which she underwent ablation 2015 ? ?She has noted over recent months dyspnea on exertion, notable on stairs and slight inclines and some bendopnea.  No nocturnal dyspnea orthopnea or peripheral edema.  No accompanying chest discomfort. ? ?Had knee surgery about a year ago not fully recovered.  Wonders whether this is contributing. ? ?Only history of hypertrophic cardiomyopathy identified following the death of her grand nephew i.e. the grandson of her sister.  Sister was identified as positive.  There is a positive gene test in the surviving brother.  No further people been gene tested as best as she knows ? ?Sleep disordered breathing but no daytime fatigue ? ?No hypertension ? ?DATE TEST EF   ?3/13 Echo   60-65 % MR mild ?LVH mild 12 mm  ?     ? ?Date Cr K Hgb  ?3/23 0.64 3.7 12.9  ?      ? ? ? ? ?Past Medical History:  ?Diagnosis Date  ? Aortic sclerosis 2000  ? on ECHO  ? Arthritis   ? knees  ? Colon polyps   ? Dr Sharlett Iles  ? Diverticulosis of colon (without mention of hemorrhage)   ? Esophageal reflux   ? Eye abnormality   ? OD ocular freckle  ? H/O hiatal hernia   ? Hyperlipidemia 1999  ? LDL 158, resolved with diet  ? Premature ventricular contractions   ? Stricture and stenosis of esophagus   ? dilation X 3  ? SVT (supraventricular tachycardia) ([place])   ? slow pathway ablated  ?   ? ?Surgical History:  ?Past Surgical History:  ?Procedure Laterality Date  ? ANKLE SURGERY    ? right  ? COLONOSCOPY  10/14/2014  ? pyrtle  ? COLONOSCOPY W/  POLYPECTOMY  2011  ? Dr Sharlett Iles  ? KNEE ARTHROSCOPY    ? bilateral  ? ovarian wedge resection    ? to allow pregnancy  ? 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 SUPRAVENTRICULAR TACHYCARDIA ABLATION N/A 06/08/2013  ? Procedure: SUPRAVENTRICULAR TACHYCARDIA ABLATION;  Surgeon: Coralyn Mark, MD;  Location: [facility];  Service: Cardiovascular;  Laterality: N/A;  ? TONSILLECTOMY    ? UPPER GASTROINTESTINAL ENDOSCOPY    ? [facility] yearly  ? wisdom teeth ext    ?  ? ?Home Meds: ?Current Meds  ?Medication Sig  ? Naproxen Sodium 220 MG CAPS Take 1 capsule by mouth daily as needed.   ? pantoprazole (PROTONIX) 40 MG tablet Take 40 mg by mouth daily.  ? ? ?Allergies:  ?Allergies  ?Allergen Reactions  ? Dexilant [Dexlansoprazole]   ?  REACTION: pressure in ears  ? Prevacid [Lansoprazole]   ?  REACTION: pressure in ears  ? Prilosec [Omeprazole]   ?  REACTION: pressure in ears  ? ? ?Social History  ? ?Socioeconomic History  ? Marital status: Married  ?  Spouse name: Not on file  ? Number of children: 2  ? Years of education: Not on file  ?  Highest education level: Not on file  ?Occupational History  ? Occupation: Network engineer  ?  Employer: [employer]  ?Tobacco Use  ? Smoking status: Never  ? Smokeless tobacco: Never  ?Vaping Use  ? Vaping Use: Never used  ?Substance and Sexual Activity  ? Alcohol use: Not Currently  ? Drug use: No  ? Sexual activity: Yes  ?  Partners: Male  ?  Birth control/protection: Post-menopausal  ?  Comment: older than 16, less than 5  ?Other Topics Concern  ? Not on file  ?Social History Narrative  ? Not on file  ? ?Social Determinants of Health  ? ?Financial Resource Strain: Low Risk   ? Difficulty of Paying Living Expenses: Not hard at all  ?Food Insecurity: No Food Insecurity  ? Worried About Charity fundraiser in the Last Year: Never true  ? Ran Out of Food in the Last Year: Never true  ?Transportation Needs: No Transportation Needs  ? Lack of Transportation (Medical): No  ? Lack of  Transportation (Non-Medical): No  ?Physical Activity: Sufficiently Active  ? Days of Exercise per Week: 5 days  ? Minutes of Exercise per Session: 30 min  ?Stress: No Stress Concern Present  ? Feeling of Stress : Not at all  ?Social Connections: Socially Integrated  ? Frequency of Communication with Friends and Family: More than three times a week  ? Frequency of Social Gatherings with Friends and Family: More than three times a week  ? Attends Religious Services: More than 4 times per year  ? Active Member of Clubs or Organizations: No  ? Attends Archivist Meetings: More than 4 times per year  ? Marital Status: Married  ?Intimate Partner Violence: Not At Risk  ? Fear of Current or Ex-Partner: No  ? Emotionally Abused: No  ? Physically Abused: No  ? Sexually Abused: No  ?  ? ?Family History  ?Problem Relation Age of Onset  ? Stroke Father 18  ? Arthritis Mother   ?     OA  ? Hypertension Mother   ? Hypothyroidism Mother   ? Stroke Mother 30  ?     ischemic  ? Heart attack Sister 49  ?     smoker  ? Ovarian cancer Sister   ? Osteopenia Sister   ? Hypertrophic cardiomyopathy Sister   ? COPD Sister   ? Heart failure Sister   ? Hypothyroidism Sister   ? Cardiomyopathy Sister   ? Emphysema Sister   ? Diabetes Maternal Grandmother   ? Colon cancer Neg Hx   ? Esophageal cancer Neg Hx   ? Liver disease Neg Hx   ? Stomach cancer Neg Hx   ? Rectal cancer Neg Hx   ?  ? ?ROS:  Please see the history of present illness.     All other systems reviewed and negative.  ? ? ?Physical Exam: ?Blood pressure 134/76, pulse 76, height '5\' 2"'$  (1.575 m), weight 173 lb 9.6 oz (78.7 kg), last menstrual period 05/27/2006, SpO2 96 %. ?General: Well developed, well nourished female in no acute distress. ?Head: Normocephalic, atraumatic, sclera non-icteric, no xanthomas, nares are without discharge. ?EENT: normal  ?Lymph Nodes:  none ?Neck: Negative for carotid bruits. JVD not elevated. ?Back:without scoliosis kyphosis ?Lungs: Clear  bilaterally to auscultation without wheezes, rales, or rhonchi. Breathing is unlabored. ?Heart: RRR with S1 S2. No murmur . No rubs, or gallops appreciated. ?Abdomen: Soft, non-tender, non-distended with normoactive bowel sounds. No hepatomegaly. No rebound/guarding. No  obvious abdominal masses. ?Msk:  Strength and tone appear normal for age. ?Extremities: No clubbing or cyanosis. No  edema.  Distal pedal pulses are 2+ and equal bilaterally. ?Skin: Warm and Dry ?Neuro: Alert and oriented X 3. CN III-XII intact Grossly normal sensory and motor function . ?Psych:  Responds to questions appropriately with a normal affect. ?  ?  ?  ? ?EKG: Sinus at 76 ?0 17/08/37 ?Otherwise normal ? ? ?Assessment and Plan:  ?Dyspnea on exertion ? ?Family history of hypertrophic cardiomyopathy ? ?Mild left ventricular hypertrophy in the absence of hypertension ? ? ? ? ?The patient has dyspnea on exertion which has been progressive.  Not volume overloaded today.  We will start with an echocardiogram as she has hypertrophy in the absence of hypertension and a family history of hypertrophic cardiomyopathy she may in fact be gene positive and relatively phenotypically negative.  I have encouraged her and her nephew, the brother of the sister who was the gene positive transmitter, to be gene tested.  She will find out the gene information and let us know. ? ? ? ? ? ? ?Virl Axe  ?

## 2021-08-25 ENCOUNTER — Encounter: Payer: Self-pay | Admitting: Internal Medicine

## 2021-08-25 DIAGNOSIS — Z8249 Family history of ischemic heart disease and other diseases of the circulatory system: Secondary | ICD-10-CM

## 2021-09-09 DIAGNOSIS — L255 Unspecified contact dermatitis due to plants, except food: Secondary | ICD-10-CM | POA: Diagnosis not present

## 2021-09-11 ENCOUNTER — Ambulatory Visit (HOSPITAL_COMMUNITY): Payer: Medicare Other | Attending: Cardiology

## 2021-09-11 DIAGNOSIS — R0602 Shortness of breath: Secondary | ICD-10-CM | POA: Insufficient documentation

## 2021-09-11 LAB — ECHOCARDIOGRAM COMPLETE
AR max vel: 2.46 cm2
AV Area VTI: 2.53 cm2
AV Area mean vel: 2.51 cm2
AV Mean grad: 6.5 mmHg
AV Peak grad: 11.4 mmHg
Ao pk vel: 1.69 m/s
Area-P 1/2: 3.85 cm2
S' Lateral: 2.4 cm

## 2021-09-27 ENCOUNTER — Telehealth: Payer: Self-pay

## 2021-09-27 NOTE — Telephone Encounter (Signed)
Attempted phone call to pt.  OK per Epic to leave voicemail message.  Pt advised echo shows normal heart muscle function but still some thickening.  Please keep appointment scheduled with Dr Broadus John for genetic counseling and testing in July.  Contact RN at [phone] or send a MyChart message for any further questions or concerns. ?

## 2021-09-27 NOTE — Telephone Encounter (Signed)
-----   Message from Deboraha Sprang, MD sent at 09/26/2021 12:22 PM EDT ----- ?Please Inform Patient Echo showed  normal heart muscle function but there is still some thickening ? ?Wghere are we with the genetic evaluation  ?Thanks SK  ?  ? ? ? ?  ? ?

## 2021-10-10 ENCOUNTER — Encounter: Payer: Self-pay | Admitting: Internal Medicine

## 2021-10-13 NOTE — Telephone Encounter (Signed)
No need

## 2021-11-19 DIAGNOSIS — M25562 Pain in left knee: Secondary | ICD-10-CM | POA: Insufficient documentation

## 2021-11-19 DIAGNOSIS — Z96651 Presence of right artificial knee joint: Secondary | ICD-10-CM | POA: Diagnosis not present

## 2021-12-04 ENCOUNTER — Ambulatory Visit: Payer: Medicare Other | Admitting: Genetic Counselor

## 2021-12-05 NOTE — Progress Notes (Signed)
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  Toni [name] (III.2 on pedigree), a 76 y.o. Caucasian lady, is here today with her husband, Toni [name]. She reports a past medical history of PVCs and SVT with an ablation in 2015. She informs of having dizziness and heart flutters over the last 6 months that are especially pronounced when she climbs stairs or bent over working in the yard. She used to play tennis and states that her heartrate is erratic when she starts playing but normalizes as she warms up and continues playing. She is very active and reports no functional limitations in her daily activities.   She underwent an echocardiogram that detected mild left ventricular hypertrophy of 1.2 cm and preserved LVEF of 60%-65%.  Family history  The proband in this family is her sister's (III.1) grandson (V.3) who was born with severe thickening of his cardiac walls. He was seen at [facility] in [place] for management of his HCM. He underwent a septal myectomy at age 9 and has an ICD implanted at the same time. She reports that he did very well after his surgery. He underwent genetic testing that detected a pathogenic variant in MYBPC3 gene, namely IVS23+1G>?Marland Kitchen She reports that this test was done 20 years ago and his siblings (V.1, V.2) and parents were tested at that time. As his father (IV.1) was found to harbor the variant, his mother- III.1 (Toni [name]'s sister) underwent genetic testing for the familial variant and was found to harbor the variant. Toni [name] is interested in determining her genotype status for the familial pathogenic variant.  The proband suffered an ICD malfunction (determined by autopsy) and died at age 34. He was found lying dead in the basement by his  sister. They suspect that he was dead for several hours prior to being found.  Relation to Proband Pedigree # Current age Heart condition/age of onset Notes  Brother  V.1 79 Asymptomatic Genotype +ve; Regular HCM screen, last screen date not known  Sister V.2 20 None Genotype negative  Father IV.1 29 HCM @ 90 ICD  @ 56  Paternal uncle IV.2 60 None Genotype negative  Paternal grandmother III.1 61 HCM @ 9 ICD @ 83, CHF, COPD   Relation to Patient Pedigree # Current age Heart condition/age of onset Notes  Sister III.1 39 HCM @ 33 ICD @ 61, CHF, COPD        Father II.3 NA Stroke @ 82 d. pneumonia @ 79  Deceased Paternal Aunts II.1, II.2 NA None Died @ 22, 75 - old age  Paternal grandfather 1.1 NA "enlarged heart " @ ? Died @ 72s- old age  Paternal grandmother I.2 ? ? diabetes        Mother II.4 NA None d. stroke @ 1  Maternal aunts- 6 II.5-II.10 NA ? d. ?  Maternal uncles- 3 II.11-II.13 NA ? d. ?  Maternal grandfather I.3 NA d. M.I.  @ 69s   Maternal grandmother I.4 NA ? d. @ 57s- cause?   Genetics Toni [name] was counseled on the genetics of hypertrophic cardiomyopathy (HCM). I discussed penetrance of HCM being incomplete i.e., not all individuals harboring a HCM mutation will present clinically with HCM, and age-related penetrance where clinical presentation of HCM increases with advanced age. As she reports no history of heart disease  in her parents who lived well past their 46s and into their 69s, it is highly likely that her sister did not inherit the MBPC3 pathogenic variant from her parents but has a de novo mutation that was then inherited by her son and grandchildren as this is inherited in an autosomal dominant manner with risk of inheriting the pathogenic variant being 50%.   I explained to her that if her sister has a de novo mutation, then considering her normal echocardiogram results, it is highly unlikely that she harbors the familial pathogenic variant. She verbalized understanding.    I also reviewed variable expression of HCM and emphasized that this condition can express at any age at any level of severity in the family. Hence, it is important for her first-degree relatives to stay vigilant and seek regular surveillance for HCM. She verbalized understanding of this.   We walked through the process of genetic testing.   I explained to her that she has a 50% chance of having a positive genetic test. If she is positive, then it indicates that one of her parents harbored the mutation but did not present with clinical severity to cause any functional limitations.    Explained the HCM screening guidelines for first degree-relatives of an HCM patient.  Clinical screening involves echocardiogram and EKG at regular intervals, frequency is typically determined by age, with children in their teens being screened every 15-18 months and those over the age of 62 getting screened every 3-5 years. She verbalized understanding of this.   I explained to her that if the familial mutation is not identified, then she did not inherit the familial variant. She and her children do not need to pursue regular cardiac screening for HCM. She expressed understanding of this.  Impression  Toni [name]'s sister harbors the familial pathogenic variant that was previously detected in her grandson with HCM. Toni [name] reports mild symptoms of heart flutters and dizziness. Her echocardiogram demonstrates preserved EF with mild LVH. She is concerned about her risk of having HCM and passing it down to her children. She is very interested in pursuing genetic testing for the familial variant.   However, she does not have a copy of her sister's test report. She has provided me with a handwritten notation of the variant that expresses the variant with legacy call of MYBPC3 IVS23+1G. Since two microexons were detected in MYBPC3 since the test was performed 20 years ago, it is important to obtain a copy of the original report to  ensure that the correct variant is being tested. She verbalized understanding.  I discussed the protections afforded by the Genetic Information Non-Discrimination Act (GINA). I explained to her that GINA protects a patient from losing their employment or health insurance based on their genotype. However, these protections do not cover life insurance and disability. She verbalized understanding of this and states that she is unsure if her children have life insurance.  Toni [name] will obtain a copy of the original report and send it to me She will obtain proof od Medicare coverage as she states that she was informed by Medicare that her test will be covered. However, Medicare does not cover genetic testing in asymptomatic or first-degree relatives without clinical evidence of HCM. She will confirm the life insurance status of her children She plan to come back for her blood draw for familial genetic testing once she has the report and confirmed coverage for her test by Medicare.  Toni [name], Ph.D, [facility] Clinical Molecular Geneticist

## 2021-12-20 DIAGNOSIS — D3131 Benign neoplasm of right choroid: Secondary | ICD-10-CM | POA: Diagnosis not present

## 2022-01-31 DIAGNOSIS — Z23 Encounter for immunization: Secondary | ICD-10-CM | POA: Diagnosis not present

## 2022-02-04 DIAGNOSIS — M1712 Unilateral primary osteoarthritis, left knee: Secondary | ICD-10-CM | POA: Diagnosis not present

## 2022-02-05 ENCOUNTER — Encounter: Payer: Self-pay | Admitting: Internal Medicine

## 2022-02-06 ENCOUNTER — Encounter: Payer: Self-pay | Admitting: Radiology

## 2022-02-06 ENCOUNTER — Ambulatory Visit (INDEPENDENT_AMBULATORY_CARE_PROVIDER_SITE_OTHER): Payer: Medicare Other | Admitting: Radiology

## 2022-02-06 VITALS — BP 142/80

## 2022-02-06 DIAGNOSIS — N760 Acute vaginitis: Secondary | ICD-10-CM | POA: Diagnosis not present

## 2022-02-06 DIAGNOSIS — R1030 Lower abdominal pain, unspecified: Secondary | ICD-10-CM

## 2022-02-06 LAB — WET PREP FOR TRICH, YEAST, CLUE

## 2022-02-06 LAB — URINALYSIS, COMPLETE
Bilirubin Urine: NEGATIVE
Glucose, UA: NEGATIVE
Hgb urine dipstick: NEGATIVE
Leukocytes,Ua: NEGATIVE
Nitrite: NEGATIVE
Protein, ur: NEGATIVE
Specific Gravity, Urine: 1.028 (ref 1.001–1.035)
pH: 5.5 (ref 5.0–8.0)

## 2022-02-06 MED ORDER — FLUCONAZOLE 150 MG PO TABS: 150.0000 mg | ORAL_TABLET | Freq: Once | ORAL | 0 refills | Status: AC

## 2022-02-06 MED ORDER — NYSTATIN-TRIAMCINOLONE 100000-0.1 UNIT/GM-% EX OINT: 1.0000 | TOPICAL_OINTMENT | Freq: Two times a day (BID) | CUTANEOUS | 0 refills | Status: DC

## 2022-02-06 NOTE — Progress Notes (Signed)
      Subjective: Toni [name] is a 76 y.o. female who complains of vaginal itching. No burning, no d/c. Started after returning from the beach. She does also reports lower pelvic pain, unsure if it is related to her diverticulitis. Leaving for [place] in 3 days.    Review of Systems  Genitourinary:  Positive for pelvic pain.    Past Medical History:  Diagnosis Date   Aortic sclerosis 2000   on ECHO   Arthritis    knees   Colon polyps    Dr Sharlett Iles   Diverticulosis of colon (without mention of hemorrhage)    Esophageal reflux    Eye abnormality    OD ocular freckle   H/O hiatal hernia    Hyperlipidemia 1999   LDL 158, resolved with diet   Premature ventricular contractions    Stricture and stenosis of esophagus    dilation X 3   SVT (supraventricular tachycardia) ([facility])    slow pathway ablated      Objective:  Today's Vitals   02/06/22 0949  BP: (!) 142/80   There is no height or weight on file to calculate BMI.   -General: no acute distress -Vulva: erythema inner labia -Vagina: scant discharge present, wet prep obtained -Cervix: no lesion or discharge, no CMT -Perineum: no lesions -Uterus: Mobile, non tender -Adnexa: no masses or tenderness  Urine dipstick shows positive for ketones.   Microscopic wet-mount exam shows negative for pathogens, normal epithelial cells.   Chaperone offered and declined.  Assessment:/Plan:  1. Lower abdominal pain  - Urine Culture - Urinalysis, Complete  2. Vulvovaginitis Rx for fluconazole sent at pt request - nystatin-triamcinolone ointment (MYCOLOG); Apply 1 Application topically 2 (two) times daily.  Dispense: 30 g; Refill: 0 - WET PREP FOR TRICH, YEAST, CLUE   Will contact patient with results of testing completed today. Avoid intercourse until symptoms are resolved. Safe sex encouraged. Avoid the use of soaps or perfumed products in the peri area. Avoid tub baths and sitting in sweaty or wet clothing for  prolonged periods of time.

## 2022-02-07 LAB — URINE CULTURE
MICRO NUMBER:: 13911737
Result:: NO GROWTH
SPECIMEN QUALITY:: ADEQUATE

## 2022-02-11 DIAGNOSIS — M1712 Unilateral primary osteoarthritis, left knee: Secondary | ICD-10-CM | POA: Diagnosis not present

## 2022-03-13 ENCOUNTER — Ambulatory Visit: Payer: Medicare Other | Admitting: Family Medicine

## 2022-03-13 NOTE — Progress Notes (Signed)
[place] Phone: [phone] Subjective:   Toni Toni [name], am serving as a scribe for Dr. Hulan Saas.  I'm seeing this patient by the request  of:  Binnie Rail, MD  CC: Back pain follow-up  Toni [name]:[id number]  Toni Toni [name] is a 76 y.o. female coming in with complaint of back pain. Patient states that one month ago she was on vacation and developed a stabbing pain in R glute. Pain is Toni [name] longer stabbing but is still present. Lumbar flexion increases pain and pain will sometimes radiate into the lumbar spine. Tried taking mm relaxer, Aleve.   R TKR 1.5 years ago: mentions going to the beach in August and she had B quad pain. Feels like she is experiencing soreness more frequently and wants to know why this is occurring.        Past Medical History:  Diagnosis Date   Aortic sclerosis 2000   on ECHO   Arthritis    knees   Colon polyps    Dr Sharlett Iles   Diverticulosis of colon (without mention of hemorrhage)    Esophageal reflux    Eye abnormality    OD ocular freckle   H/O hiatal hernia    Hyperlipidemia 1999   LDL 158, resolved with diet   Premature ventricular contractions    Stricture and stenosis of esophagus    dilation X 3   SVT (supraventricular tachycardia) ([facility])    slow pathway ablated   Past Surgical History:  Procedure Laterality Date   ANKLE SURGERY     right   COLONOSCOPY  10/14/2014   pyrtle   COLONOSCOPY W/ POLYPECTOMY  2011   Dr Sharlett Iles   KNEE ARTHROSCOPY     bilateral   ovarian wedge resection     to allow pregnancy   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   SUPRAVENTRICULAR TACHYCARDIA ABLATION N/A 06/08/2013   Procedure: SUPRAVENTRICULAR TACHYCARDIA ABLATION;  Surgeon: Coralyn Mark, MD;  Location: [facility];  Service: Cardiovascular;  Laterality: N/A;   TONSILLECTOMY     UPPER GASTROINTESTINAL ENDOSCOPY     [facility] yearly   wisdom teeth ext      Social History   Socioeconomic History   Marital status: Married    Spouse name: Not on file   Number of children: 2   Years of education: Not on file   Highest education level: Not on file  Occupational History   Occupation: Producer, 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[employer]  Tobacco Use   Smoking status: Never   Smokeless tobacco: Never  Vaping Use   Vaping Use: Never used  Substance and Sexual Activity   Alcohol use: Not Currently   Drug use: Toni [name]   Sexual activity: Yes    Partners: Male    Birth control/protection: Post-menopausal    Comment: older than 41, less than 5  Other Topics Concern   Not on file  Social History Narrative   Not on file   Social Determinants of Health   Financial Resource Strain: Low Risk  (08/22/2021)   Overall Financial Resource Strain (CARDIA)    Difficulty of Paying Living Expenses: Not hard at all  Food Insecurity: Toni [name] Food Insecurity (08/22/2021)   Hunger Vital Sign    Worried About Running Out of Food in the Last Year: Never true    Toni Toni [name] in the Last Year: Never true  Transportation Needs: Toni [name] Transportation Needs (08/22/2021)  PRAPARE - Hydrologist (Medical): Toni [name]    Lack of Transportation (Non-Medical): Toni [name]  Physical Activity: Sufficiently Active (08/22/2021)   Exercise Vital Sign    Days of Exercise per Week: 5 days    Minutes of Exercise per Session: 30 min  Stress: Toni [name] Stress Concern Present (08/22/2021)   Toni Toni [name]    Feeling of Stress : Not at all  Social Connections: Toni [name] (08/22/2021)   Social Connection and Isolation Panel [NHANES]    Frequency of Communication with Friends and Family: More than three times a week    Frequency of Social Gatherings with Friends and Family: More than three times a week    Attends Religious Services: More than 4 times per year    Active Member of [employer] or Organizations: Toni [name]    Attends Arts development officer: More than 4 times per year    Marital Status: Married   Allergies  Allergen Reactions   Dexilant [Dexlansoprazole]     REACTION: pressure in ears   Prevacid [Lansoprazole]     REACTION: pressure in ears   Prilosec [Omeprazole]     REACTION: pressure in ears   Family History  Problem Relation Age of Onset   Stroke Father 32   Arthritis Mother        OA   Hypertension Mother    Hypothyroidism Mother    Stroke Mother 32       ischemic   Heart attack Sister 80       smoker   Ovarian cancer Sister    Osteopenia Sister    Hypertrophic cardiomyopathy Sister    COPD Sister    Heart failure Sister    Hypothyroidism Sister    Cardiomyopathy Sister    Emphysema Sister    Diabetes Maternal Grandmother    Colon cancer Neg Hx    Esophageal cancer Neg Hx    Liver disease Neg Hx    Stomach cancer Neg Hx    Rectal cancer Neg Hx     Current Outpatient Medications (Endocrine & Metabolic):    predniSONE (DELTASONE) 20 MG tablet, Take 2 tablets (40 mg total) by mouth daily with breakfast.    Current Outpatient Medications (Analgesics):    Naproxen Sodium 220 MG CAPS, Take 1 capsule by mouth daily as needed.    Current Outpatient Medications (Other):    COLLAGEN PO, Take by mouth.   gabapentin (NEURONTIN) 100 MG capsule, Take 2 capsules (200 mg total) by mouth at bedtime.   nystatin-triamcinolone ointment (MYCOLOG), Apply 1 Application topically 2 (two) times daily.   pantoprazole (PROTONIX) 40 MG tablet, Take 40 mg by mouth daily.   tiZANidine (ZANAFLEX) 2 MG tablet, Take 1 tablet (2 mg total) by mouth at bedtime.   Reviewed prior external information including notes and imaging from  primary care provider As well as notes that were available from care everywhere and other healthcare systems.  Past medical history, social, surgical and family history all reviewed in electronic medical record.  Toni [name] pertanent information unless stated regarding to the  chief complaint.   Review of Systems:  Toni [name] headache, visual changes, nausea, vomiting, diarrhea, constipation, dizziness, abdominal pain, skin rash, fevers, chills, night sweats, weight loss, swollen lymph nodes, body aches, joint swelling, chest pain, shortness of breath, mood changes. POSITIVE muscle aches  Objective  Blood pressure (!) 140/72, pulse 73, height '5\' 2"'$  (1.575 m), weight 171 lb (77.6 kg), last  menstrual period 05/27/2006, SpO2 97 %.   General: Toni [name] apparent distress alert and oriented x3 mood and affect normal, dressed appropriately.  HEENT: Pupils equal, extraocular movements intact  Respiratory: Patient's speak in full sentences and does not appear short of breath  Cardiovascular: Toni [name] lower extremity edema, non tender, Toni [name] erythema  Low back does have some loss of lordosis.  Tender to palpation in the paraspinal musculature.  Worsening tightness noted with straight leg test with some mild radicular symptoms down the right leg.  Patient has a negative FABER test.  Mild pain in the gluteal area.    Impression and Recommendations:     The above documentation has been reviewed and is accurate and complete Lyndal Pulley, DO

## 2022-03-15 ENCOUNTER — Ambulatory Visit (INDEPENDENT_AMBULATORY_CARE_PROVIDER_SITE_OTHER): Payer: Medicare Other

## 2022-03-15 ENCOUNTER — Ambulatory Visit (INDEPENDENT_AMBULATORY_CARE_PROVIDER_SITE_OTHER): Payer: Medicare Other | Admitting: Family Medicine

## 2022-03-15 VITALS — BP 140/72 | HR 73 | Ht 62.0 in | Wt 171.0 lb

## 2022-03-15 DIAGNOSIS — M5416 Radiculopathy, lumbar region: Secondary | ICD-10-CM | POA: Insufficient documentation

## 2022-03-15 DIAGNOSIS — M545 Low back pain, unspecified: Secondary | ICD-10-CM | POA: Diagnosis not present

## 2022-03-15 DIAGNOSIS — M4316 Spondylolisthesis, lumbar region: Secondary | ICD-10-CM | POA: Diagnosis not present

## 2022-03-15 MED ORDER — PREDNISONE 20 MG PO TABS: 40.0000 mg | ORAL_TABLET | Freq: Every day | ORAL | 0 refills | Status: DC

## 2022-03-15 MED ORDER — GABAPENTIN 100 MG PO CAPS: 200.0000 mg | ORAL_CAPSULE | Freq: Every day | ORAL | 0 refills | Status: DC

## 2022-03-15 MED ORDER — TIZANIDINE HCL 2 MG PO TABS: 2.0000 mg | ORAL_TABLET | Freq: Every day | ORAL | 0 refills | Status: DC

## 2022-03-15 NOTE — Assessment & Plan Note (Signed)
Patient does have radicular symptoms noted.  Discussed which activities to do and which ones to avoid. Prednisone daily, gabapentin, zanaflex for the trip  RTC in 4 weeks

## 2022-03-15 NOTE — Patient Instructions (Addendum)
Xray today Exercises Prednisone '40mg'$  for 5 days starting tmrw After that start Aleve 2x a day for 5 days Gabapentin '200mg'$  at night Zanaflex '2mg'$  if needed at night See me again in 5-6 weeks

## 2022-03-18 ENCOUNTER — Encounter: Payer: Self-pay | Admitting: Family Medicine

## 2022-04-17 ENCOUNTER — Other Ambulatory Visit: Payer: Self-pay | Admitting: Obstetrics & Gynecology

## 2022-04-17 DIAGNOSIS — Z1231 Encounter for screening mammogram for malignant neoplasm of breast: Secondary | ICD-10-CM

## 2022-04-22 ENCOUNTER — Ambulatory Visit: Payer: Medicare Other

## 2022-05-02 NOTE — Progress Notes (Unsigned)
[place] [address] [place] Phone: [phone] Subjective:   Toni [name], am serving as a scribe for Dr. Hulan Saas.  I'm seeing this patient by the request  of:  Binnie Rail, MD  CC: low back pain follow up   [id number]  03/15/2022 Patient does have radicular symptoms noted.  Discussed which activities to do and which ones to avoid. Prednisone daily, gabapentin, zanaflex for the trip  RTC in 4 weeks   Update 05/07/2022 Toni [name] is a 76 y.o. female coming in with complaint of lumbar spine pain. Had back pain before traveling. Patient states that she is doing much better. Does have pain in B quads and groin with lumbar flexion when doing yardwork.        Past Medical History:  Diagnosis Date   Aortic sclerosis 2000   on ECHO   Arthritis    knees   Colon polyps    Dr Sharlett Iles   Diverticulosis of colon (without mention of hemorrhage)    Esophageal reflux    Eye abnormality    OD ocular freckle   H/O hiatal hernia    Hyperlipidemia 1999   LDL 158, resolved with diet   Premature ventricular contractions    Stricture and stenosis of esophagus    dilation X 3   SVT (supraventricular tachycardia) (HCC)    slow pathway ablated   Past Surgical History:  Procedure Laterality Date   ANKLE SURGERY     right   COLONOSCOPY  10/14/2014   [facility]   COLONOSCOPY W/ POLYPECTOMY  2011   Dr Sharlett Iles   KNEE ARTHROSCOPY     bilateral   ovarian wedge resection     to allow pregnancy   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   SUPRAVENTRICULAR TACHYCARDIA ABLATION N/A 06/08/2013   Procedure: SUPRAVENTRICULAR TACHYCARDIA ABLATION;  Surgeon: Coralyn Mark, MD;  Location: [facility];  Service: Cardiovascular;  Laterality: N/A;   TONSILLECTOMY     UPPER GASTROINTESTINAL ENDOSCOPY     [facility] yearly   wisdom teeth ext     Social History   Socioeconomic History   Marital status: Married     Spouse name: Not on file   Number of children: 2   Years of education: Not on file   Highest education level: Not on file  Occupational History   Occupation: Producer, 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[employer]  Tobacco Use   Smoking status: Never   Smokeless tobacco: Never  Vaping Use   Vaping Use: Never used  Substance and Sexual Activity   Alcohol use: Not Currently   Drug use: [name]   Sexual activity: Yes    Partners: Male    Birth control/protection: Post-menopausal    Comment: older than 64, less than 5  Other Topics Concern   Not on file  Social History Narrative   Not on file   Social Determinants of Health   Financial Resource Strain: Low Risk  (08/22/2021)   Overall Financial Resource Strain (CARDIA)    Difficulty of Paying Living Expenses: Not hard at all  Food Insecurity: [name] Food Insecurity (08/22/2021)   Hunger Vital Sign    Worried About Running Out of Food in the Last Year: Never true    [place] in the Last Year: Never true  Transportation Needs: [name] Transportation Needs (08/22/2021)   PRAPARE - Transportation    Lack of Transportation (Medical): [name]    Lack  of Transportation (Non-Medical): [name]  Physical Activity: Sufficiently Active (08/22/2021)   Exercise Vital Sign    Days of Exercise per Week: 5 days    Minutes of Exercise per Session: 30 min  Stress: [name] Stress Concern Present (08/22/2021)   [place]    Feeling of Stress : Not at all  Social Connections: [place] (08/22/2021)   Social Connection and Isolation Panel [NHANES]    Frequency of Communication with Friends and Family: More than three times a week    Frequency of Social Gatherings with Friends and Family: More than three times a week    Attends Religious Services: More than 4 times per year    Active Member of [employer] or Organizations: [name]    Attends Music therapist: More than 4 times per year    Marital Status:  Married   Allergies  Allergen Reactions   Dexilant [Dexlansoprazole]     REACTION: pressure in ears   Prevacid [Lansoprazole]     REACTION: pressure in ears   Prilosec [Omeprazole]     REACTION: pressure in ears   Family History  Problem Relation Age of Onset   Stroke Father 41   Arthritis Mother        OA   Hypertension Mother    Hypothyroidism Mother    Stroke Mother 17       ischemic   Heart attack Sister 60       smoker   Ovarian cancer Sister    Osteopenia Sister    Hypertrophic cardiomyopathy Sister    COPD Sister    Heart failure Sister    Hypothyroidism Sister    Cardiomyopathy Sister    Emphysema Sister    Diabetes Maternal Grandmother    Colon cancer Neg Hx    Esophageal cancer Neg Hx    Liver disease Neg Hx    Stomach cancer Neg Hx    Rectal cancer Neg Hx     Current Outpatient Medications (Endocrine & Metabolic):    predniSONE (DELTASONE) 20 MG tablet, Take 2 tablets (40 mg total) by mouth daily with breakfast.    Current Outpatient Medications (Analgesics):    Naproxen Sodium 220 MG CAPS, Take 1 capsule by mouth daily as needed.    Current Outpatient Medications (Other):    COLLAGEN PO, Take by mouth.   gabapentin (NEURONTIN) 100 MG capsule, Take 2 capsules (200 mg total) by mouth at bedtime.   nystatin-triamcinolone ointment (MYCOLOG), Apply 1 Application topically 2 (two) times daily.   pantoprazole (PROTONIX) 40 MG tablet, Take 40 mg by mouth daily.   tiZANidine (ZANAFLEX) 2 MG tablet, Take 1 tablet (2 mg total) by mouth at bedtime.   Reviewed prior external information including notes and imaging from  primary care provider As well as notes that were available from care everywhere and other healthcare systems.  Past medical history, social, surgical and family history all reviewed in electronic medical record.  [name] pertanent information unless stated regarding to the chief complaint.   Review of Systems:  [name] headache, visual changes,  nausea, vomiting, diarrhea, constipation, dizziness, abdominal pain, skin rash, fevers, chills, night sweats, weight loss, swollen lymph nodes, body aches, joint swelling, chest pain, shortness of breath, mood changes. POSITIVE muscle aches  Objective  Last menstrual period 05/27/2006.   General: [name] apparent distress alert and oriented x3 mood and affect normal, dressed appropriately.  HEENT: Pupils equal, extraocular movements intact  Respiratory: Patient's speak in  full sentences and does not appear short of breath  Cardiovascular: [name] lower extremity edema, non tender, [name] erythema  Low back exam shows     Impression and Recommendations:    The above documentation has been reviewed and is accurate and complete Lyndal Pulley, DO

## 2022-05-07 ENCOUNTER — Ambulatory Visit (INDEPENDENT_AMBULATORY_CARE_PROVIDER_SITE_OTHER): Payer: Medicare Other

## 2022-05-07 ENCOUNTER — Ambulatory Visit (INDEPENDENT_AMBULATORY_CARE_PROVIDER_SITE_OTHER): Payer: Medicare Other | Admitting: Family Medicine

## 2022-05-07 VITALS — BP 128/84 | HR 87 | Ht 62.0 in | Wt 176.0 lb

## 2022-05-07 DIAGNOSIS — R102 Pelvic and perineal pain: Secondary | ICD-10-CM | POA: Diagnosis not present

## 2022-05-07 DIAGNOSIS — M25551 Pain in right hip: Secondary | ICD-10-CM | POA: Diagnosis not present

## 2022-05-07 DIAGNOSIS — M25552 Pain in left hip: Secondary | ICD-10-CM | POA: Diagnosis not present

## 2022-05-07 DIAGNOSIS — M5416 Radiculopathy, lumbar region: Secondary | ICD-10-CM

## 2022-05-07 NOTE — Patient Instructions (Signed)
Pelvic xray Glad you are better Lets not change exercises or medications See me again in 2-3 months

## 2022-05-08 NOTE — Assessment & Plan Note (Signed)
Seems to be improving overall.  Still concerned with patient having some limited range of motion of the hip.  Will get x-rays to further evaluate the pelvis area to make sure this is not also contributing.  Discussed with patient about icing regimen and home exercises.  Discussed continuing the gabapentin 200 mg at night as well as the Zanaflex for breakthrough pain.  Patient feels like as long as she continues this she should continue to improve.  Follow-up again in 2 months

## 2022-05-21 IMAGING — MG MM DIGITAL SCREENING BILAT W/ TOMO AND CAD
8 series · 8 of 24 positions shown · non-contrast
Comparison: Previous exam(s).

CLINICAL DATA: Screening.

EXAM:
DIGITAL SCREENING BILATERAL MAMMOGRAM WITH TOMOSYNTHESIS AND CAD
TECHNIQUE: Bilateral screening digital craniocaudal and mediolateral oblique
mammograms were obtained. Bilateral screening digital breast
tomosynthesis was performed. The images were evaluated with
computer-aided detection.

[L MLO synth-2D]
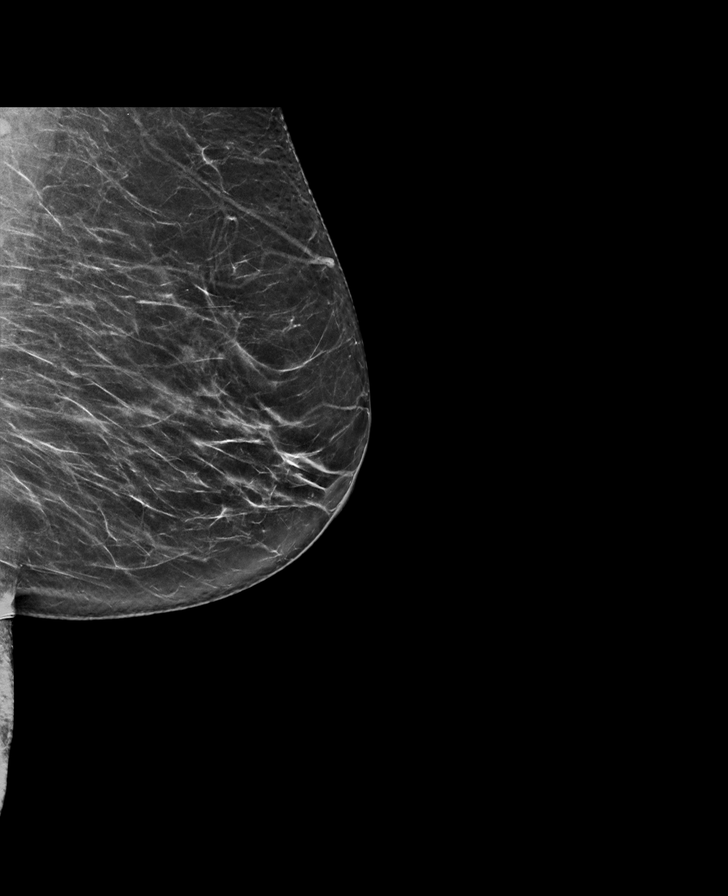

[R MLO synth-2D]
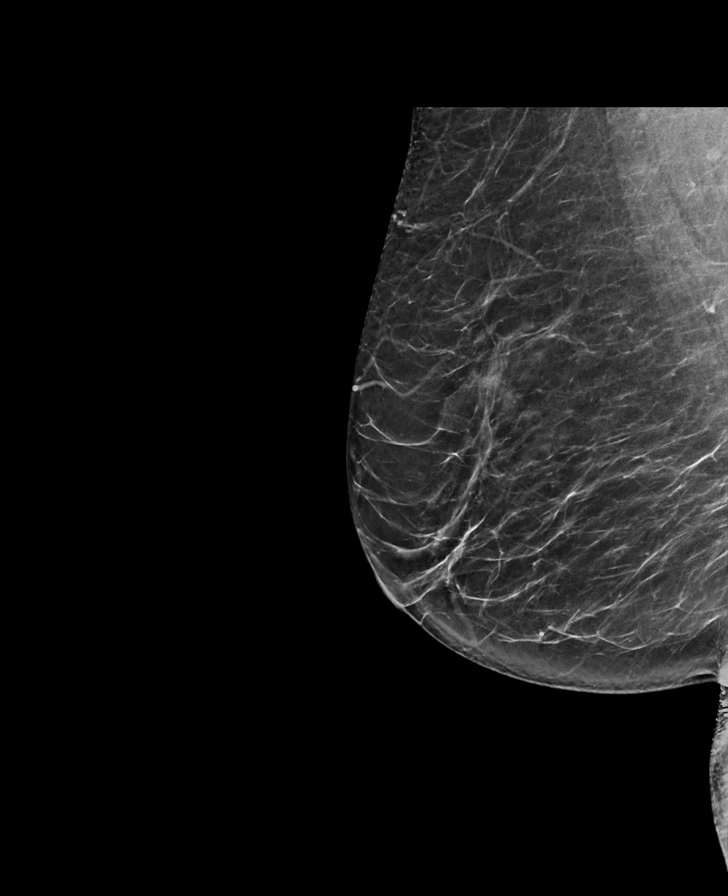

[R CC synth-2D]
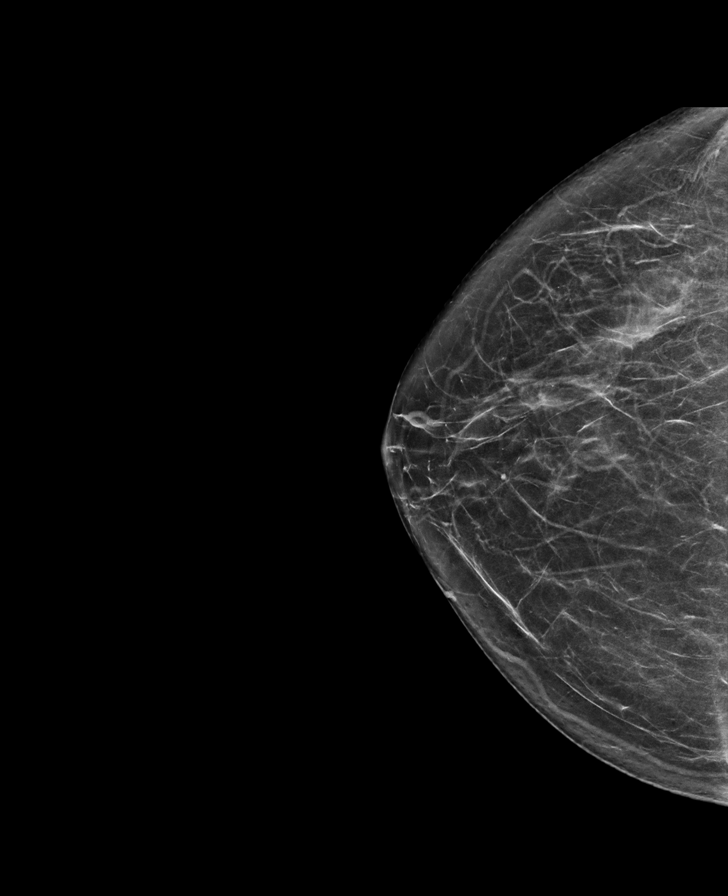

[L CC synth-2D]
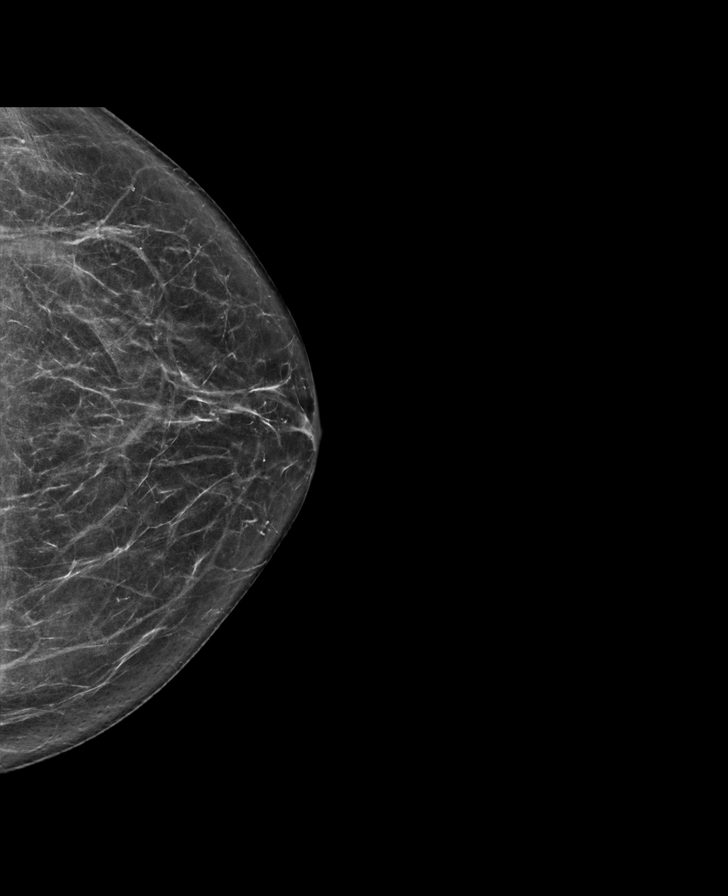

[L CC tomo · tomo slice 33/64.0]
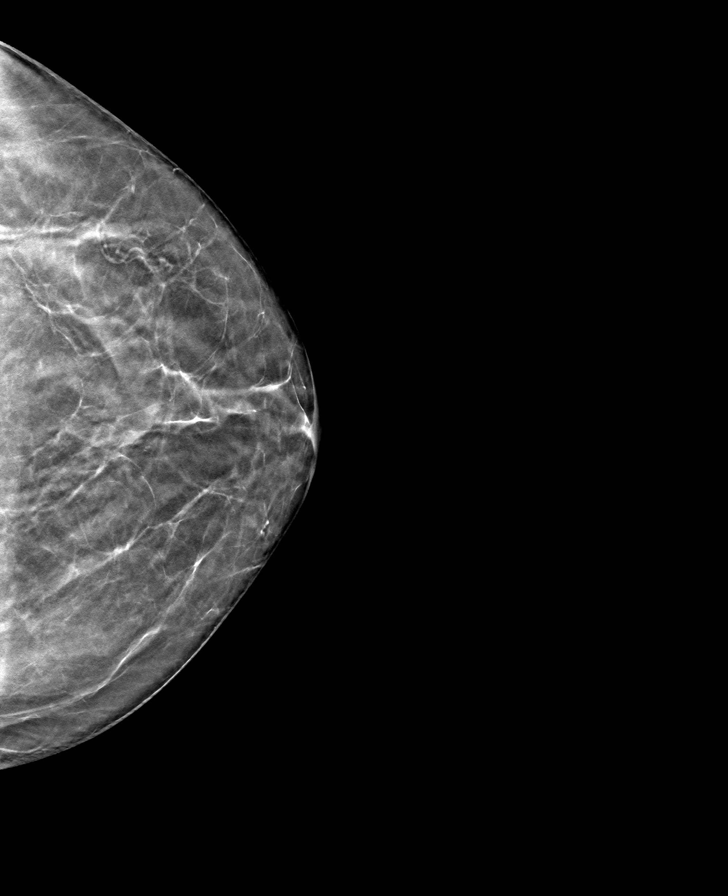

[R CC tomo · tomo slice 35/68.0]
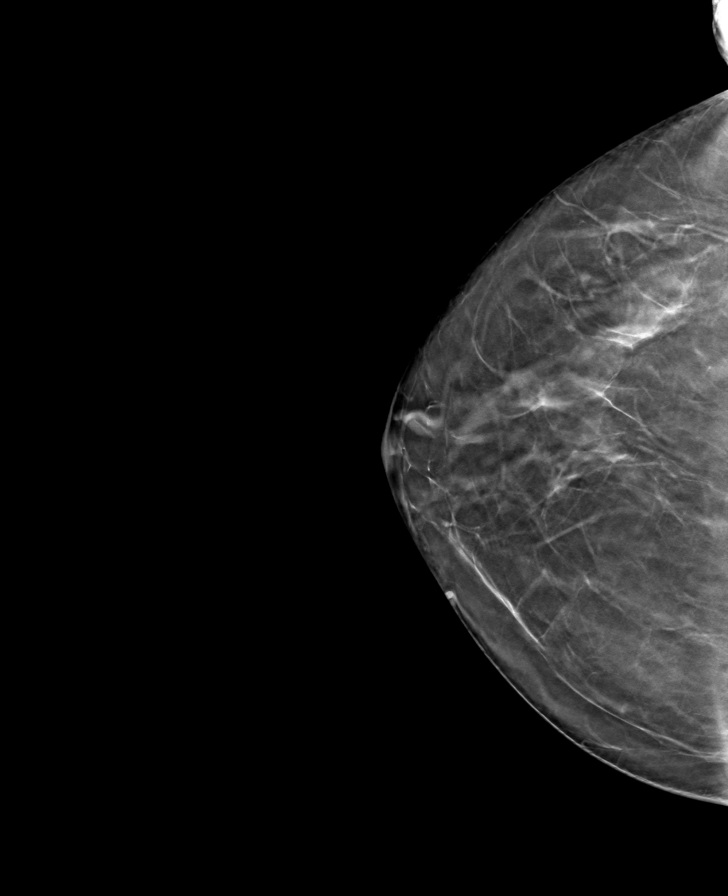

[L MLO tomo · tomo slice 35/70.0]
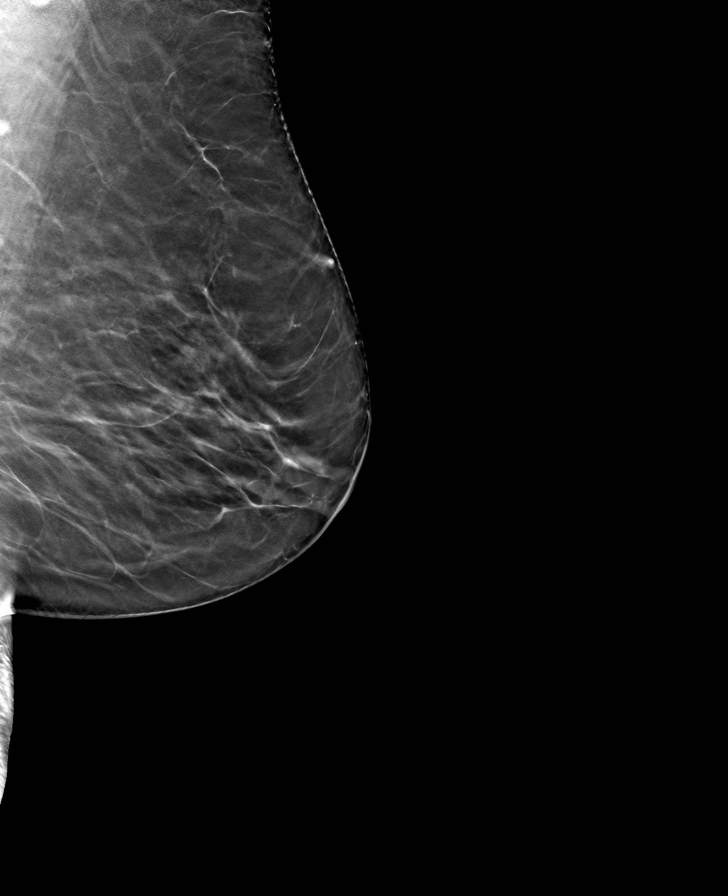

[R MLO tomo · tomo slice 37/72.0]
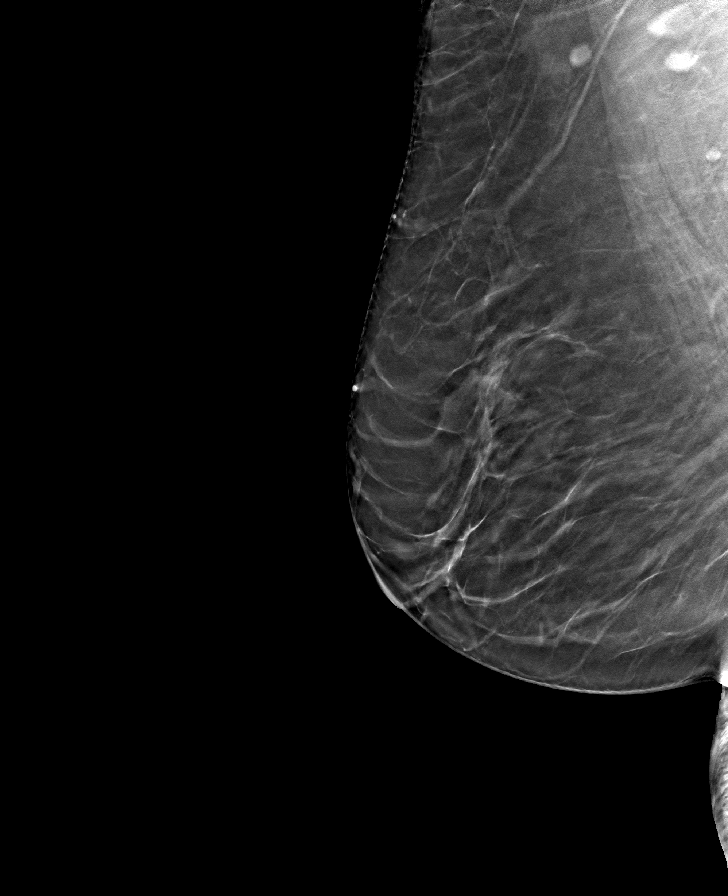

[8 of 24 positions shown; findings below may reference images not displayed]

ACR Breast Density Category b: There are scattered areas of
fibroglandular density.
FINDINGS: There are no findings suspicious for malignancy.
IMPRESSION: No mammographic evidence of malignancy. A result letter of this
screening mammogram will be mailed directly to the patient.

RECOMMENDATION:
Screening mammogram in one year. (Code:51-O-LD2)

BI-RADS CATEGORY  1: Negative.

## 2022-05-27 DIAGNOSIS — M8580 Other specified disorders of bone density and structure, unspecified site: Secondary | ICD-10-CM

## 2022-05-27 HISTORY — DX: Other specified disorders of bone density and structure, unspecified site: M85.80

## 2022-05-29 ENCOUNTER — Ambulatory Visit
Admission: RE | Admit: 2022-05-29 | Discharge: 2022-05-29 | Disposition: A | Discharge status: Home / Self Care | Payer: Medicare Other | Source: Ambulatory Visit | Attending: Obstetrics & Gynecology | Admitting: Obstetrics & Gynecology

## 2022-05-29 DIAGNOSIS — Z1231 Encounter for screening mammogram for malignant neoplasm of breast: Secondary | ICD-10-CM

## 2022-06-05 ENCOUNTER — Other Ambulatory Visit: Payer: Self-pay | Admitting: Internal Medicine

## 2022-06-05 DIAGNOSIS — M85852 Other specified disorders of bone density and structure, left thigh: Secondary | ICD-10-CM

## 2022-06-12 ENCOUNTER — Ambulatory Visit (INDEPENDENT_AMBULATORY_CARE_PROVIDER_SITE_OTHER)
Admission: RE | Admit: 2022-06-12 | Discharge: 2022-06-12 | Disposition: A | Discharge status: Home / Self Care | Payer: Medicare Other | Source: Ambulatory Visit | Attending: Internal Medicine | Admitting: Internal Medicine

## 2022-06-12 DIAGNOSIS — M85852 Other specified disorders of bone density and structure, left thigh: Secondary | ICD-10-CM | POA: Diagnosis not present

## 2022-06-12 DIAGNOSIS — M1712 Unilateral primary osteoarthritis, left knee: Secondary | ICD-10-CM | POA: Diagnosis not present

## 2022-06-14 ENCOUNTER — Encounter: Payer: Self-pay | Admitting: Internal Medicine

## 2022-06-15 ENCOUNTER — Encounter: Payer: Self-pay | Admitting: Internal Medicine

## 2022-06-17 ENCOUNTER — Ambulatory Visit (INDEPENDENT_AMBULATORY_CARE_PROVIDER_SITE_OTHER): Payer: Medicare Other | Admitting: Internal Medicine

## 2022-06-17 ENCOUNTER — Encounter: Payer: Self-pay | Admitting: Internal Medicine

## 2022-06-17 VITALS — BP 130/72 | HR 75 | Temp 98.7°F | Ht 62.0 in | Wt 169.0 lb

## 2022-06-17 DIAGNOSIS — M810 Age-related osteoporosis without current pathological fracture: Secondary | ICD-10-CM | POA: Diagnosis not present

## 2022-06-17 NOTE — Patient Instructions (Addendum)
Medications changes include :   none   Will repeat bone density in two years.     Calcium goal is 1200 mg a day Vitamin D 1000 - 2000 units a day.    Osteoporosis  Osteoporosis is when the bones get thin and weak. This can cause your bones to break (fracture) more easily. What are the causes? The exact cause of this condition is not known. What increases the risk? Having family members with this condition. Not eating enough healthy foods. Taking certain medicines. Being female. Being age 75 or older. Smoking or using other products that contain nicotine or tobacco, such as e-cigarettes or chewing tobacco. Not exercising. Being of European or Asian ancestry. Having a small body frame. What are the signs or symptoms? A broken bone might be the first sign, especially if the break results from a fall or injury that usually would not cause a bone to break. Other signs and symptoms include: Pain in the neck or low back. Being hunched over (stooped posture). Getting shorter. How is this treated? Eating more foods with more calcium and vitamin D in them. Doing exercises. Stopping tobacco use. Limiting how much alcohol you drink. Taking medicines to slow bone loss or help make the bones stronger. Taking supplements of calcium and vitamin D every day. Taking medicines to replace chemicals in the body (hormone replacement medicines). Monitoring your levels of calcium and vitamin D. The goal of treatment is to strengthen your bones and lower your risk for a bone break. Follow these instructions at home: Eating and drinking Eat plenty of calcium and vitamin D. These nutrients are good for your bones. Good sources of calcium and vitamin D include: Some fish, such as salmon and tuna. Foods that have calcium and vitamin D added to them (fortified foods), such as some breakfast cereals. Egg yolks. Cheese. Liver.  Activity Do exercises as told by your doctor. Ask your doctor  what exercises are safe for you. You should do: Exercises that make your muscles work to hold your body weight up (weight-bearing exercises). These include tai chi, yoga, and walking. Exercises to make your muscles stronger. One example is lifting weights. Lifestyle Do not drink alcohol if: Your doctor tells you not to drink. You are pregnant, may be pregnant, or are planning to become pregnant. If you drink alcohol: Limit how much you use to: 0-1 drink a day for women. 0-2 drinks a day for men. Know how much alcohol is in your drink. In the [place] one drink equals one 12 oz bottle of beer (355 mL), one 5 oz glass of wine (148 mL), or one 1 oz glass of hard liquor (44 mL). Do not smoke or use any products that contain nicotine or tobacco. If you need help quitting, ask your doctor. Preventing falls Use tools to help you move around (mobility aids) as needed. These include canes, walkers, scooters, and crutches. Keep rooms well-lit. Put away things on the floor that could make you trip. These include cords and rugs. Install safety rails on stairs. Install grab bars in bathrooms. Use rubber mats in slippery areas, like bathrooms. Wear shoes that: Fit you well. Support your feet. Have closed toes. Have rubber soles or low heels. Tell your doctor about all of the medicines you are taking. Some medicines can make you more likely to fall. General instructions Take over-the-counter and prescription medicines only as told by your doctor. Keep all follow-up visits. Contact a doctor if: You  have not been tested (screened) for osteoporosis and you are: A woman who is age 75 or older. A man who is age 75 or older. Get help right away if: You fall. You get hurt. Summary Osteoporosis happens when your bones get thin and weak. Weak bones can break (fracture) more easily. Eat plenty of calcium and vitamin D. These are good for your bones. Tell your doctor about all of the medicines that you  take. This information is not intended to replace advice given to you by your health care provider. Make sure you discuss any questions you have with your health care provider. Document Revised: 10/28/2019 Document Reviewed: 10/28/2019 Elsevier Patient Education  [place].

## 2022-06-17 NOTE — Assessment & Plan Note (Addendum)
Diagnosis of osteoporosis is new-LFN T-score -2.3, FRAX perhaps 4.2% Recent bone density 06/12/2022-reviewed together She will start taking calcium and vitamin D supplementation.  Discussed goal of calcium intake and vitamin D intake for the day.  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

## 2022-06-17 NOTE — Progress Notes (Signed)
Subjective:    Patient ID: Toni [name], female    DOB: 04-15-1946, 77 y.o.   MRN: [id number]     HPI Toni [name] is here for follow up of her chronic medical problems, including osteoporosis, recent DEXA scan  DEXA from 06/12/2022 Comparison: 2020 Clinical data: Pt is a 77 y.o. female without previous history of fracture.   Results:   Lumbar spine L1-L3 Femoral neck (FN)  T-score +0.7 RFN: -1.8 LFN: -2.3  Change in BMD from previous DXA test (%) Down 2.9%* Down 8.8%  (*) statistically significant   Assessment: By the [facility] Criteria for diagnosis based on bone density, this patient has Low Bone Density     FRAX 10-year fracture risk calculator: 14.7 % for any major fracture and 4.2 % for hip fracture.  Has been taking a multivitamin.  She plans on starting a separate calcium and vitamin D pill.  She can also increase the calcium in her diet.  She has had a right knee replacement and now has left knee osteoarthritis that is limiting her.  She is currently getting injections.  She does do water exercise, but it is limited how much she can do on land.  She can do a little bit of walking.   Medications and allergies reviewed with patient and updated if appropriate.  Current Outpatient Medications on File Prior to Visit  Medication Sig Dispense Refill   COLLAGEN PO Take by mouth.     gabapentin (NEURONTIN) 100 MG capsule Take 2 capsules (200 mg total) by mouth at bedtime. 180 capsule 0   Naproxen Sodium 220 MG CAPS Take 1 capsule by mouth daily as needed.      nystatin-triamcinolone ointment (MYCOLOG) Apply 1 Application topically 2 (two) times daily. 30 g 0   pantoprazole (PROTONIX) 40 MG tablet Take 40 mg by mouth daily.     predniSONE (DELTASONE) 20 MG tablet Take 2 tablets (40 mg total) by mouth daily with breakfast. 10 tablet 0   tiZANidine (ZANAFLEX) 2 MG tablet Take 1 tablet (2 mg total) by mouth at bedtime. 30 tablet 0   No current facility-administered medications  on file prior to visit.     Review of Systems     Objective:  There were no vitals filed for this visit. BP Readings from Last 3 Encounters:  05/07/22 128/84  03/15/22 (!) 140/72  02/06/22 (!) 142/80   Wt Readings from Last 3 Encounters:  05/07/22 176 lb (79.8 kg)  03/15/22 171 lb (77.6 kg)  08/24/21 173 lb 9.6 oz (78.7 kg)   There is no height or weight on file to calculate BMI.    Physical Exam Constitutional:      General: She is not in acute distress.    Appearance: Normal appearance. She is not ill-appearing.  Skin:    General: Skin is warm and dry.  Neurological:     Mental Status: She is alert.        Lab Results  Component Value Date   WBC 6.3 08/17/2021   HGB 12.9 08/17/2021   HCT 37.6 08/17/2021   PLT 222.0 08/17/2021   GLUCOSE 88 08/17/2021   CHOL 152 08/17/2021   TRIG 79.0 08/17/2021   HDL 54.70 08/17/2021   LDLDIRECT 135.1 08/25/2012   LDLCALC 81 08/17/2021   ALT 10 08/17/2021   AST 14 08/17/2021   NA 143 08/17/2021   K 3.7 08/17/2021   CL 108 08/17/2021   CREATININE 0.65 08/17/2021   BUN  13 08/17/2021   CO2 30 08/17/2021   TSH 1.59 08/17/2021   HGBA1C 5.3 08/17/2021     Assessment & Plan:    See Problem List for Assessment and Plan of chronic medical problems.     I spent 20 minutes dedicated to the care of this patient on the date of this encounter including review of recent labs, bone density scan obtaining history, communicating with the patient-reviewing new diagnosis, treatment options including supplementation, diet changes, exercise and medications and documenting clinical information in the EHR

## 2022-07-23 ENCOUNTER — Ambulatory Visit: Payer: Medicare Other

## 2022-08-12 ENCOUNTER — Ambulatory Visit: Payer: Medicare Other

## 2022-08-12 NOTE — Progress Notes (Unsigned)
[place] Phone: [phone] Subjective:   [name], am serving as a scribe for Dr. Hulan Saas.  I'm seeing this patient by the request  of:  Binnie Rail, MD  CC: Low back pain follow-up  [id number]  05/07/2022 Seems to be improving overall.  Still concerned with patient having some limited range of motion of the hip.  Will get x-rays to further evaluate the pelvis area to make sure this is not also contributing.  Discussed with patient about icing regimen and home exercises.  Discussed continuing the gabapentin 200 mg at night as well as the Zanaflex for breakthrough pain.  Patient feels like as long as she continues this she should continue to improve.  Follow-up again in 2 months    Update 08/13/2022 Juwana Radziewicz is a 77 y.o. female coming in with complaint of R sided lumbar radiculopathy. Patient states that she is worse than last visit. Has been doing a lot of traveling. Pain is now running from her back into her quad. Tingling in R upper leg.      Past Medical History:  Diagnosis Date   Aortic sclerosis 2000   on ECHO   Arthritis    knees   Colon polyps    Dr Sharlett Iles   Diverticulosis of colon (without mention of hemorrhage)    Esophageal reflux    Eye abnormality    OD ocular freckle   H/O hiatal hernia    Hyperlipidemia 1999   LDL 158, resolved with diet   Premature ventricular contractions    Stricture and stenosis of esophagus    dilation X 3   SVT (supraventricular tachycardia)    slow pathway ablated   Past Surgical History:  Procedure Laterality Date   ANKLE SURGERY     right   COLONOSCOPY  10/14/2014   py[facility]   COLONOSCOPY W/ POLYPECTOMY  2011   Dr Sharlett Iles   KNEE ARTHROSCOPY     bilateral   ovarian wedge resection     to allow pregnancy   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   SUPRAVENTRICULAR TACHYCARDIA ABLATION N/A 06/08/2013   Procedure:  SUPRAVENTRICULAR TACHYCARDIA ABLATION;  Surgeon: Coralyn Mark, MD;  Location: [facility];  Service: Cardiovascular;  Laterality: N/A;   TONSILLECTOMY     UPPER GASTROINTESTINAL ENDOSCOPY     [facility] yearly   wisdom teeth ext     Social History   Socioeconomic History   Marital status: Married    Spouse name: Not on file   Number of children: 2   Years of education: Not on file   Highest education level: Not on file  Occupational History   Occupation: Producer, 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[employer]  Tobacco Use   Smoking status: Never   Smokeless tobacco: Never  Vaping Use   Vaping Use: Never used  Substance and Sexual Activity   Alcohol use: Not Currently   Drug use: No   Sexual activity: Yes    Partners: Male    Birth control/protection: Post-menopausal    Comment: older than 73, less than 5  Other Topics Concern   Not on file  Social History Narrative   Not on file   Social Determinants of Health   Financial Resource Strain: Low Risk  (08/22/2021)   Overall Financial Resource Strain (CARDIA)    Difficulty of Paying Living Expenses: Not hard at all  Food Insecurity: No Food Insecurity (08/22/2021)  Hunger Vital Sign    Worried About Running Out of Food in the Last Year: Never true    Ran Out of Food in the Last Year: Never true  Transportation Needs: No Transportation Needs (08/22/2021)   PRAPARE - Hydrologist (Medical): No    Lack of Transportation (Non-Medical): No  Physical Activity: Sufficiently Active (08/22/2021)   Exercise Vital Sign    Days of Exercise per Week: 5 days    Minutes of Exercise per Session: 30 min  Stress: No Stress Concern Present (08/22/2021)   [place]    Feeling of Stress : Not at all  Social Connections: [place] (08/22/2021)   Social Connection and Isolation Panel [NHANES]    Frequency of Communication with Friends and Family:  More than three times a week    Frequency of Social Gatherings with Friends and Family: More than three times a week    Attends Religious Services: More than 4 times per year    Active Member of [employer] or Organizations: No    Attends Music therapist: More than 4 times per year    Marital Status: Married   Allergies  Allergen Reactions   Dexilant [Dexlansoprazole]     REACTION: pressure in ears   Prevacid [Lansoprazole]     REACTION: pressure in ears   Prilosec [Omeprazole]     REACTION: pressure in ears   Family History  Problem Relation Age of Onset   Stroke Father 40   Arthritis Mother        OA   Hypertension Mother    Hypothyroidism Mother    Stroke Mother 38       ischemic   Heart attack Sister 73       smoker   Ovarian cancer Sister    Osteopenia Sister    Hypertrophic cardiomyopathy Sister    COPD Sister    Heart failure Sister    Hypothyroidism Sister    Cardiomyopathy Sister    Emphysema Sister    Diabetes Maternal Grandmother    Colon cancer Neg Hx    Esophageal cancer Neg Hx    Liver disease Neg Hx    Stomach cancer Neg Hx    Rectal cancer Neg Hx          Current Outpatient Medications (Other):    pantoprazole (PROTONIX) 40 MG tablet, Take 40 mg by mouth daily.   Reviewed prior external information including notes and imaging from  primary care provider As well as notes that were available from care everywhere and other healthcare systems.  Past medical history, social, surgical and family history all reviewed in electronic medical record.  No pertanent information unless stated regarding to the chief complaint.   Review of Systems:  No headache, visual changes, nausea, vomiting, diarrhea, constipation, dizziness, abdominal pain, skin rash, fevers, chills, night sweats, weight loss, swollen lymph nodes, body aches, joint swelling, chest pain, shortness of breath, mood changes. POSITIVE muscle aches  Objective  Blood pressure (!)  142/76, pulse 63, height 5\' 2"  (1.575 m), weight 178 lb (80.7 kg), last menstrual period 05/27/2006, SpO2 96 %.   General: No apparent distress alert and oriented x3 mood and affect normal, dressed appropriately.  HEENT: Pupils equal, extraocular movements intact  Respiratory: Patient's speak in full sentences and does not appear short of breath  Cardiovascular: No lower extremity edema, non tender, no erythema  Severely antalgic gait noted.  Patient  does have weakness noted of the right side with hip abduction as well as straight leg test.  Patient does have weakness with 3 out of 5 strength of dorsiflexion of the right foot compared to left.  Difficulty with FABER test.  No pain with internal rotation of the hips.    Impression and Recommendations:    The above documentation has been reviewed and is accurate and complete Lyndal Pulley, DO

## 2022-08-13 ENCOUNTER — Ambulatory Visit (INDEPENDENT_AMBULATORY_CARE_PROVIDER_SITE_OTHER): Payer: Medicare Other | Admitting: Family Medicine

## 2022-08-13 VITALS — BP 142/76 | HR 63 | Ht 62.0 in | Wt 178.0 lb

## 2022-08-13 DIAGNOSIS — M5416 Radiculopathy, lumbar region: Secondary | ICD-10-CM

## 2022-08-13 NOTE — Assessment & Plan Note (Addendum)
Patient continues to have the radicular symptoms.  Positive straight leg test noted, weakness noted with dorsi flexion of the right foot.  Deep tendon reflexes are intact but patient is having worsening symptoms with failure of conservative therapy.  At this point I do feel advanced imaging is warranted to further evaluate if there is any nerve impingement that is contributing.  Failed all other conservative therapy including formal physical therapy and medications.  Discussed that patient could be a candidate for possible injections.  Discussed icing regimen and home exercises, follow-up with me again in 6 to 8 weeks

## 2022-08-13 NOTE — Patient Instructions (Addendum)
MRI lumbar spine [phone] We will be in touch and will see if injections will be helpful

## 2022-08-19 ENCOUNTER — Telehealth: Payer: Self-pay

## 2022-08-19 NOTE — Telephone Encounter (Signed)
Contacted Toni [name] to schedule their annual wellness visit. Appointment made for 09/10/22.  Norton Blizzard, [place] ([facility])  [facility] [phone]

## 2022-09-09 ENCOUNTER — Ambulatory Visit
Admission: RE | Admit: 2022-09-09 | Discharge: 2022-09-09 | Disposition: A | Discharge status: Home / Self Care | Payer: Medicare Other | Source: Ambulatory Visit | Attending: Family Medicine | Admitting: Family Medicine

## 2022-09-09 DIAGNOSIS — M47816 Spondylosis without myelopathy or radiculopathy, lumbar region: Secondary | ICD-10-CM | POA: Diagnosis not present

## 2022-09-09 DIAGNOSIS — M5416 Radiculopathy, lumbar region: Secondary | ICD-10-CM

## 2022-09-09 DIAGNOSIS — M545 Low back pain, unspecified: Secondary | ICD-10-CM | POA: Diagnosis not present

## 2022-09-09 DIAGNOSIS — M48061 Spinal stenosis, lumbar region without neurogenic claudication: Secondary | ICD-10-CM | POA: Diagnosis not present

## 2022-09-10 ENCOUNTER — Other Ambulatory Visit: Payer: Self-pay

## 2022-09-10 ENCOUNTER — Encounter: Payer: Self-pay | Admitting: Family Medicine

## 2022-09-10 ENCOUNTER — Ambulatory Visit (INDEPENDENT_AMBULATORY_CARE_PROVIDER_SITE_OTHER): Payer: Medicare Other

## 2022-09-10 VITALS — Ht 62.0 in | Wt 178.0 lb

## 2022-09-10 DIAGNOSIS — Z0000 Encounter for general adult medical examination without abnormal findings: Secondary | ICD-10-CM | POA: Diagnosis not present

## 2022-09-10 MED ORDER — PREDNISONE 20 MG PO TABS: 20.0000 mg | ORAL_TABLET | Freq: Every day | ORAL | 0 refills | Status: DC

## 2022-09-10 MED ORDER — TIZANIDINE HCL 2 MG PO TABS: 2.0000 mg | ORAL_TABLET | Freq: Every day | ORAL | 0 refills | Status: DC

## 2022-09-10 NOTE — Progress Notes (Signed)
Subjective:   Toni [name] is a 77 y.o. female who presents for Medicare Annual (Subsequent) preventive examination.  I connected with  [name] on 09/10/22 by a audio enabled telemedicine application and verified that I am speaking with the correct person using two identifiers.  Patient Location: Home  Provider Location: Home Office  I discussed the limitations of evaluation and management by telemedicine. The patient expressed understanding and agreed to proceed.  Review of Systems     Cardiac Risk Factors include: advanced age (>95men, >60 women)     Objective:    Today's Vitals   09/10/22 1016  Weight: 178 lb (80.7 kg)  Height: 5\' 2"  (1.575 m)   Body mass index is 32.56 kg/m.     09/10/2022   10:40 AM 08/22/2021   11:34 AM 08/10/2020    2:07 PM 12/23/2018   11:18 AM 11/04/2017    9:30 AM 06/14/2016    9:40 AM 02/13/2016    9:06 AM  Advanced Directives  Does Patient Have a Medical Advance Directive? Yes Yes Yes Yes Yes No Yes  Type of Estate agent of [place];Living will Living will;Healthcare Power of [employer] Power of [place];Living will Healthcare Power of [place];Living will  Healthcare Power of [place];Living will  Does patient want to make changes to medical advance directive? No - Patient declined No - Patient declined No - Patient declined      Copy of Healthcare Power of Attorney in Chart? No - copy requested No - copy requested  No - copy requested No - copy requested  No - copy requested    Current Medications (verified) Outpatient Encounter Medications as of 09/10/2022  Medication Sig   pantoprazole (PROTONIX) 40 MG tablet Take 40 mg by mouth daily.   No facility-administered encounter medications on file as of 09/10/2022.    Allergies (verified) Dexilant [dexlansoprazole], Prevacid [lansoprazole], and Prilosec [omeprazole]   History: Past Medical History:  Diagnosis Date   Aortic sclerosis 2000    on ECHO   Arthritis    knees   Colon polyps    Dr Jarold Motto   Diverticulosis of colon (without mention of hemorrhage)    Esophageal reflux    Eye abnormality    OD ocular freckle   H/O hiatal hernia    Hyperlipidemia 1999   LDL 158, resolved with diet   Premature ventricular contractions    Stricture and stenosis of esophagus    dilation X 3   SVT (supraventricular tachycardia)    slow pathway ablated   Past Surgical History:  Procedure Laterality Date   ANKLE SURGERY     right   COLONOSCOPY  10/14/2014   pyrtle   COLONOSCOPY W/ POLYPECTOMY  2011   Dr Jarold Motto   KNEE ARTHROSCOPY     bilateral   ovarian wedge resection     to allow pregnancy   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   SUPRAVENTRICULAR TACHYCARDIA ABLATION N/A 06/08/2013   Procedure: SUPRAVENTRICULAR TACHYCARDIA ABLATION;  Surgeon: Gardiner Rhyme, MD;  Location: [facility];  Service: Cardiovascular;  Laterality: N/A;   TONSILLECTOMY     UPPER GASTROINTESTINAL ENDOSCOPY     [facility] yearly   wisdom teeth ext     Family History  Problem Relation Age of Onset   Stroke Father 4   Arthritis Mother        OA   Hypertension Mother    Hypothyroidism Mother    Stroke Mother 28  ischemic   Heart attack Sister 52       smoker   Ovarian cancer Sister    Osteopenia Sister    Hypertrophic cardiomyopathy Sister    COPD Sister    Heart failure Sister    Hypothyroidism Sister    Cardiomyopathy Sister    Emphysema Sister    Diabetes Maternal Grandmother    Colon cancer Neg Hx    Esophageal cancer Neg Hx    Liver disease Neg Hx    Stomach cancer Neg Hx    Rectal cancer Neg Hx    Social History   Socioeconomic History   Marital status: Married    Spouse name: Not on file   Number of children: 2   Years of education: Not on file   Highest education level: Not on file  Occupational History   Occupation: Producer, 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[employer]  Tobacco Use   Smoking status: Never    Smokeless tobacco: Never  Vaping Use   Vaping Use: Never used  Substance and Sexual Activity   Alcohol use: Not Currently   Drug use: No   Sexual activity: Yes    Partners: Male    Birth control/protection: Post-menopausal    Comment: older than 16, less than 5  Other Topics Concern   Not on file  Social History Narrative   Not on file   Social Determinants of Health   Financial Resource Strain: Low Risk  (09/10/2022)   Overall Financial Resource Strain (CARDIA)    Difficulty of Paying Living Expenses: Not hard at all  Food Insecurity: No Food Insecurity (09/10/2022)   Hunger Vital Sign    Worried About Running Out of Food in the Last Year: Never true    Ran Out of Food in the Last Year: Never true  Transportation Needs: No Transportation Needs (09/10/2022)   PRAPARE - Administrator, Civil Service (Medical): No    Lack of Transportation (Non-Medical): No  Physical Activity: Insufficiently Active (09/10/2022)   Exercise Vital Sign    Days of Exercise per Week: 3 days    Minutes of Exercise per Session: 30 min  Stress: No Stress Concern Present (09/10/2022)   [employer] of Occupational Health - Occupational Stress Questionnaire    Feeling of Stress : Not at all  Social Connections: Unknown (09/10/2022)   Social Connection and Isolation Panel [NHANES]    Frequency of Communication with Friends and Family: Once a week    Frequency of Social Gatherings with Friends and Family: Patient declined    Attends Religious Services: 1 to 4 times per year    Active Member of [employer] or Organizations: No    Attends Engineer, structural: Never    Marital Status: Married    Tobacco Counseling Counseling given: Not Answered   Clinical Intake:  Pre-visit preparation completed: Yes  Pain : No/denies pain  Diabetes: No  How often do you need to have someone help you when you read instructions, pamphlets, or other written materials from your doctor or  pharmacy?: 1 - Never  Diabetic?No   Interpreter Needed?: No  Information entered by :: Kandis Fantasia LPN   Activities of Daily Living    09/10/2022   10:34 AM 09/07/2022    1:19 PM  In your present state of health, do you have any difficulty performing the following activities:  Hearing? 0 0  Vision? 0 0  Difficulty concentrating or making decisions? 0 0  Walking or climbing  stairs? 1 1  Dressing or bathing? 0 0  Doing errands, shopping? 0 0  Preparing Food and eating ? N N  Using the Toilet? N N  In the past six months, have you accidently leaked urine? N N  Do you have problems with loss of bowel control? N N  Managing your Medications? N N  Managing your Finances? N N  Housekeeping or managing your Housekeeping? N N    Patient Care Team: Pincus Sanes, MD as PCP - General (Internal Medicine) Hillis Range, MD (Inactive) as PCP - Cardiology (Cardiology) Davina Poke as Consulting Physician (Optometry) Pyrtle, Carie Caddy, MD as Consulting Physician (Gastroenterology) Enrigue Catena Ronda Fairly, MD as Consulting Physician (Gastroenterology)  Indicate any recent Medical Services you may have received from other than [facility] providers in the past year (date may be approximate).     Assessment:   This is a routine wellness examination for [place].  Hearing/Vision screen Hearing Screening - Comments:: Denies hearing difficulties   Vision Screening - Comments:: Wears rx glasses - up to date with routine eye exams with Dr. Shea Evans    Dietary issues and exercise activities discussed: Current Exercise Habits: Home exercise routine, Type of exercise: walking, Time (Minutes): 30, Frequency (Times/Week): 3, Weekly Exercise (Minutes/Week): 90, Intensity: Mild   Goals Addressed   None   Depression Screen    09/10/2022   10:33 AM 08/22/2021   11:39 AM 08/17/2021    9:42 AM 08/17/2021    9:39 AM 08/10/2020    2:48 PM 12/23/2018   11:41 AM 11/04/2017    9:30 AM  PHQ 2/9 Scores  PHQ - 2 Score  0 0 0 0 0 0 0  PHQ- 9 Score       0    Fall Risk    09/10/2022   10:19 AM 09/07/2022    1:19 PM 06/17/2022    2:26 PM 08/17/2021    9:41 AM 08/17/2021    9:39 AM  Fall Risk   Falls in the past year? 0 0 0 0 0  Number falls in past yr: 0 0 0 0 0  Injury with Fall? 0 0 0 0 0  Risk for fall due to : No Fall Risks  No Fall Risks No Fall Risks No Fall Risks  Follow up Falls prevention discussed;Education provided;Falls evaluation completed  Falls evaluation completed Falls evaluation completed Falls evaluation completed    FALL RISK PREVENTION PERTAINING TO THE HOME:  Any stairs in or around the home? Yes  If so, are there any without handrails? No  Home free of loose throw rugs in walkways, pet beds, electrical cords, etc? Yes  Adequate lighting in your home to reduce risk of falls? Yes   ASSISTIVE DEVICES UTILIZED TO PREVENT FALLS:  Life alert? No  Use of a cane, walker or w/c? No  Grab bars in the bathroom? Yes  Shower chair or bench in shower? No  Elevated toilet seat or a handicapped toilet? Yes   TIMED UP AND GO:  Was the test performed? No . Telephonic visit   Cognitive Function:        09/10/2022   10:40 AM  6CIT Screen  What Year? 0 points  What month? 0 points  What time? 0 points  Count back from 20 0 points  Months in reverse 0 points  Repeat phrase 0 points  Total Score 0 points    Immunizations Immunization History  Administered Date(s) Administered   Fluad Quad(high Dose  65+) 01/31/2022   Influenza, High Dose Seasonal PF 02/21/2017, 02/06/2018   Influenza,inj,Quad PF,6+ Mos 02/24/2013, 02/07/2015   Influenza-Unspecified 02/10/2014, 01/11/2019   PFIZER(Purple Top)SARS-COV-2 Vaccination 06/21/2019, 07/12/2019   Pneumococcal Conjugate-13 02/16/2014   Pneumococcal Polysaccharide-23 06/08/2015   Td 05/27/2001   Tdap 06/27/2010, 02/26/2011   Zoster, Live 04/12/2010    TDAP status: Due, Education has been provided regarding the importance of this  vaccine. Advised may receive this vaccine at local pharmacy or Health Dept. Aware to provide a copy of the vaccination record if obtained from local pharmacy or Health Dept. Verbalized acceptance and understanding.  Flu Vaccine status: Up to date  Pneumococcal vaccine status: Up to date  Covid-19 vaccine status: Information provided on how to obtain vaccines.   Qualifies for Shingles Vaccine? Yes   Zostavax completed Yes   Shingrix Completed?: No.    Education has been provided regarding the importance of this vaccine. Patient has been advised to call insurance company to determine out of pocket expense if they have not yet received this vaccine. Advised may also receive vaccine at local pharmacy or Health Dept. Verbalized acceptance and understanding.  Screening Tests Health Maintenance  Topic Date Due   Zoster Vaccines- Shingrix (1 of 2) Never done   DTaP/Tdap/Td (4 - Td or Tdap) 02/25/2021   COVID-19 Vaccine (3 - 2023-24 season) 01/25/2022   INFLUENZA VACCINE  12/26/2022   Medicare Annual Wellness (AWV)  09/10/2023   DEXA SCAN  06/12/2024   COLONOSCOPY (Pts 45-22yrs Insurance coverage will need to be confirmed)  12/05/2024   Pneumonia Vaccine 71+ Years old  Completed   Hepatitis C Screening  Completed   HPV VACCINES  Aged Out    Health Maintenance  Health Maintenance Due  Topic Date Due   Zoster Vaccines- Shingrix (1 of 2) Never done   DTaP/Tdap/Td (4 - Td or Tdap) 02/25/2021   COVID-19 Vaccine (3 - 2023-24 season) 01/25/2022    Colorectal cancer screening: Type of screening: Colonoscopy. Completed 12/06/19. Repeat every 5 years  Mammogram status: Completed 05/29/22. Repeat every year  Bone Density status: Completed 06/12/22. Results reflect: Bone density results: OSTEOPENIA. Repeat every 2 years.  Lung Cancer Screening: (Low Dose CT Chest recommended if Age 76-80 years, 30 pack-year currently smoking OR have quit w/in 15years.) does not qualify.   Lung Cancer Screening  Referral: n/a  Additional Screening:  Hepatitis C Screening: does qualify; Completed 09/25/16  Vision Screening: Recommended annual ophthalmology exams for early detection of glaucoma and other disorders of the eye. Is the patient up to date with their annual eye exam?  Yes  Who is the provider or what is the name of the office in which the patient attends annual eye exams? Dr. Shea Evans  If pt is not established with a provider, would they like to be referred to a provider to establish care? No .   Dental Screening: Recommended annual dental exams for proper oral hygiene  Community Resource Referral / Chronic Care Management: CRR required this visit?  No   CCM required this visit?  No      Plan:     I have personally reviewed and noted the following in the patient's chart:   Medical and social history Use of alcohol, tobacco or illicit drugs  Current medications and supplements including opioid prescriptions. Patient is not currently taking opioid prescriptions. Functional ability and status Nutritional status Physical activity Advanced directives List of other physicians Hospitalizations, surgeries, and ER visits in previous 12 months Vitals Screenings  to include cognitive, depression, and falls Referrals and appointments  In addition, I have reviewed and discussed with patient certain preventive protocols, quality metrics, and best practice recommendations. A written personalized care plan for preventive services as well as general preventive health recommendations were provided to patient.     Durwin Nora, [place]   1/61/0960   Due to this being a virtual visit, the after visit summary with patients personalized plan was offered to patient via mail or my-chart. Patient would like to access on my-chart  Nurse Notes: No concerns

## 2022-09-10 NOTE — Patient Instructions (Signed)
Toni [name] , Thank you for taking time to come for your Medicare Wellness Visit. I appreciate your ongoing commitment to your health goals. Please review the following plan we discussed and let me know if I can assist you in the future.   These are the goals we discussed:  Goals      Patient Stated     I want to lose weight by eating healthy and exercise. I want to enjoy life, family and travel.        This is a list of the screening recommended for you and due dates:  Health Maintenance  Topic Date Due   Zoster (Shingles) Vaccine (1 of 2) Never done   DTaP/Tdap/Td vaccine (4 - Td or Tdap) 02/25/2021   COVID-19 Vaccine (3 - 2023-24 season) 01/25/2022   Flu Shot  12/26/2022   Medicare Annual Wellness Visit  09/10/2023   DEXA scan (bone density measurement)  06/12/2024   Colon Cancer Screening  12/05/2024   Pneumonia Vaccine  Completed   Hepatitis C Screening: USPSTF Recommendation to screen - Ages 18-79 yo.  Completed   HPV Vaccine  Aged Out    Advanced directives: Please bring a copy of your health care power of attorney and living will to the office to be added to your chart at your convenience.   Conditions/risks identified: Aim for 30 minutes of exercise or brisk walking, 6-8 glasses of water, and 5 servings of fruits and vegetables each day.   Next appointment: Follow up in one year for your annual wellness visit    Preventive Care 65 Years and Older, Female Preventive care refers to lifestyle choices and visits with your health care provider that can promote health and wellness. What does preventive care include? A yearly physical exam. This is also called an annual well check. Dental exams once or twice a year. Routine eye exams. Ask your health care provider how often you should have your eyes checked. Personal lifestyle choices, including: Daily care of your teeth and gums. Regular physical activity. Eating a healthy diet. Avoiding tobacco and drug use. Limiting  alcohol use. Practicing safe sex. Taking low-dose aspirin every day. Taking vitamin and mineral supplements as recommended by your health care provider. What happens during an annual well check? The services and screenings done by your health care provider during your annual well check will depend on your age, overall health, lifestyle risk factors, and family history of disease. Counseling  Your health care provider may ask you questions about your: Alcohol use. Tobacco use. Drug use. Emotional well-being. Home and relationship well-being. Sexual activity. Eating habits. History of falls. Memory and ability to understand (cognition). Work and work Astronomer. Reproductive health. Screening  You may have the following tests or measurements: Height, weight, and BMI. Blood pressure. Lipid and cholesterol levels. These may be checked every 5 years, or more frequently if you are over 100 years old. Skin check. Lung cancer screening. You may have this screening every year starting at age 27 if you have a 30-pack-year history of smoking and currently smoke or have quit within the past 15 years. Fecal occult blood test (FOBT) of the stool. You may have this test every year starting at age 61. Flexible sigmoidoscopy or colonoscopy. You may have a sigmoidoscopy every 5 years or a colonoscopy every 10 years starting at age 64. Hepatitis C blood test. Hepatitis B blood test. Sexually transmitted disease (STD) testing. Diabetes screening. This is done by checking your blood sugar (glucose)  after you have not eaten for a while (fasting). You may have this done every 1-3 years. Bone density scan. This is done to screen for osteoporosis. You may have this done starting at age 32. Mammogram. This may be done every 1-2 years. Talk to your health care provider about how often you should have regular mammograms. Talk with your health care provider about your test results, treatment options, and if  necessary, the need for more tests. Vaccines  Your health care provider may recommend certain vaccines, such as: Influenza vaccine. This is recommended every year. Tetanus, diphtheria, and acellular pertussis (Tdap, Td) vaccine. You may need a Td booster every 10 years. Zoster vaccine. You may need this after age 51. Pneumococcal 13-valent conjugate (PCV13) vaccine. One dose is recommended after age 36. Pneumococcal polysaccharide (PPSV23) vaccine. One dose is recommended after age 71. Talk to your health care provider about which screenings and vaccines you need and how often you need them. This information is not intended to replace advice given to you by your health care provider. Make sure you discuss any questions you have with your health care provider. Document Released: 06/09/2015 Document Revised: 01/31/2016 Document Reviewed: 03/14/2015 Elsevier Interactive Patient Education  2017 [place] Prevention in the Home Falls can cause injuries. They can happen to people of all ages. There are many things you can do to make your home safe and to help prevent falls. What can I do on the outside of my home? Regularly fix the edges of walkways and driveways and fix any cracks. Remove anything that might make you trip as you walk through a door, such as a raised step or threshold. Trim any bushes or trees on the path to your home. Use bright outdoor lighting. Clear any walking paths of anything that might make someone trip, such as rocks or tools. Regularly check to see if handrails are loose or broken. Make sure that both sides of any steps have handrails. Any raised decks and porches should have guardrails on the edges. Have any leaves, snow, or ice cleared regularly. Use sand or salt on walking paths during winter. Clean up any spills in your garage right away. This includes oil or grease spills. What can I do in the bathroom? Use night lights. Install grab bars by the toilet  and in the tub and shower. Do not use towel bars as grab bars. Use non-skid mats or decals in the tub or shower. If you need to sit down in the shower, use a plastic, non-slip stool. Keep the floor dry. Clean up any water that spills on the floor as soon as it happens. Remove soap buildup in the tub or shower regularly. Attach bath mats securely with double-sided non-slip rug tape. Do not have throw rugs and other things on the floor that can make you trip. What can I do in the bedroom? Use night lights. Make sure that you have a light by your bed that is easy to reach. Do not use any sheets or blankets that are too big for your bed. They should not hang down onto the floor. Have a firm chair that has side arms. You can use this for support while you get dressed. Do not have throw rugs and other things on the floor that can make you trip. What can I do in the kitchen? Clean up any spills right away. Avoid walking on wet floors. Keep items that you use a lot in easy-to-reach places. If  you need to reach something above you, use a strong step stool that has a grab bar. Keep electrical cords out of the way. Do not use floor polish or wax that makes floors slippery. If you must use wax, use non-skid floor wax. Do not have throw rugs and other things on the floor that can make you trip. What can I do with my stairs? Do not leave any items on the stairs. Make sure that there are handrails on both sides of the stairs and use them. Fix handrails that are broken or loose. Make sure that handrails are as long as the stairways. Check any carpeting to make sure that it is firmly attached to the stairs. Fix any carpet that is loose or worn. Avoid having throw rugs at the top or bottom of the stairs. If you do have throw rugs, attach them to the floor with carpet tape. Make sure that you have a light switch at the top of the stairs and the bottom of the stairs. If you do not have them, ask someone to add  them for you. What else can I do to help prevent falls? Wear shoes that: Do not have high heels. Have rubber bottoms. Are comfortable and fit you well. Are closed at the toe. Do not wear sandals. If you use a stepladder: Make sure that it is fully opened. Do not climb a closed stepladder. Make sure that both sides of the stepladder are locked into place. Ask someone to hold it for you, if possible. Clearly mark and make sure that you can see: Any grab bars or handrails. First and last steps. Where the edge of each step is. Use tools that help you move around (mobility aids) if they are needed. These include: Canes. Walkers. Scooters. Crutches. Turn on the lights when you go into a dark area. Replace any light bulbs as soon as they burn out. Set up your furniture so you have a clear path. Avoid moving your furniture around. If any of your floors are uneven, fix them. If there are any pets around you, be aware of where they are. Review your medicines with your doctor. Some medicines can make you feel dizzy. This can increase your chance of falling. Ask your doctor what other things that you can do to help prevent falls. This information is not intended to replace advice given to you by your health care provider. Make sure you discuss any questions you have with your health care provider. Document Released: 03/09/2009 Document Revised: 10/19/2015 Document Reviewed: 06/17/2014 Elsevier Interactive Patient Education  2017 [employer].

## 2022-09-11 ENCOUNTER — Encounter: Payer: Self-pay | Admitting: Family Medicine

## 2022-09-11 DIAGNOSIS — M1712 Unilateral primary osteoarthritis, left knee: Secondary | ICD-10-CM | POA: Diagnosis not present

## 2022-10-03 ENCOUNTER — Other Ambulatory Visit: Payer: Self-pay | Admitting: Family Medicine

## 2022-10-10 NOTE — Progress Notes (Signed)
[facility] [address] [place] [address] Phone: [phone] Subjective:   INadine Counts, am serving as a scribe for Dr. Antoine Primas.  I'm seeing this patient by the request  of:  Pincus Sanes, MD  CC: Right-sided low back pain  [id number]  08/13/2022 Patient continues to have the radicular symptoms.  Positive straight leg test noted, weakness noted with dorsi flexion of the right foot.  Deep tendon reflexes are intact but patient is having worsening symptoms with failure of conservative therapy.  At this point I do feel advanced imaging is warranted to further evaluate if there is any nerve impingement that is contributing.  Failed all other conservative therapy including formal physical therapy and medications.  Discussed that patient could be a candidate for possible injections.  Discussed icing regimen and home exercises, follow-up with me again in 6 to 8 weeks      Update 10/11/2022 Maryn Moher is a 77 y.o. female coming in with complaint of R sided lumbar radiculopathy. Patient states doing much better than before. Not really having any pain. Wanted to go over MRI.       Past Medical History:  Diagnosis Date   Aortic sclerosis 2000   on ECHO   Arthritis    knees   Colon polyps    Dr Jarold Motto   Diverticulosis of colon (without mention of hemorrhage)    Esophageal reflux    Eye abnormality    OD ocular freckle   H/O hiatal hernia    Hyperlipidemia 1999   LDL 158, resolved with diet   Premature ventricular contractions    Stricture and stenosis of esophagus    dilation X 3   SVT (supraventricular tachycardia)    slow pathway ablated   Past Surgical History:  Procedure Laterality Date   ANKLE SURGERY     right   COLONOSCOPY  10/14/2014   pyrtle   COLONOSCOPY W/ POLYPECTOMY  2011   Dr Jarold Motto   KNEE ARTHROSCOPY     bilateral   ovarian wedge resection     to allow pregnancy   REPLACEMENT TOTAL KNEE Right  10/31/2020   SUPRAVENTRICULAR TACHYCARDIA ABLATION N/A 06/08/2013   Procedure: SUPRAVENTRICULAR TACHYCARDIA ABLATION;  Surgeon: Gardiner Rhyme, MD;  Location: [facility];  Service: Cardiovascular;  Laterality: N/A;   TONSILLECTOMY     UPPER GASTROINTESTINAL ENDOSCOPY     [facility] yearly   wisdom teeth ext     Social History   Socioeconomic History   Marital status: Married    Spouse name: Not on file   Number of children: 2   Years of education: Not on file   Highest education level: Not on file  Occupational History   Occupation: Producer, 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[employer]  Tobacco Use   Smoking status: Never   Smokeless tobacco: Never  Vaping Use   Vaping Use: Never used  Substance and Sexual Activity   Alcohol use: Not Currently   Drug use: No   Sexual activity: Yes    Partners: Male    Birth control/protection: Post-menopausal    Comment: older than 16, less than 5  Other Topics Concern   Not on file  Social History Narrative   Not on file   Social Determinants of Health   Financial Resource Strain: Low Risk  (09/10/2022)   Overall Financial Resource Strain (CARDIA)    Difficulty of Paying Living Expenses: Not hard at all  Food  Insecurity: No Food Insecurity (09/10/2022)   Hunger Vital Sign    Worried About Running Out of Food in the Last Year: Never true    Ran Out of Food in the Last Year: Never true  Transportation Needs: No Transportation Needs (09/10/2022)   PRAPARE - Administrator, Civil Service (Medical): No    Lack of Transportation (Non-Medical): No  Physical Activity: Insufficiently Active (09/10/2022)   Exercise Vital Sign    Days of Exercise per Week: 3 days    Minutes of Exercise per Session: 30 min  Stress: No Stress Concern Present (09/10/2022)   [employer] of Occupational Health - Occupational Stress Questionnaire    Feeling of Stress : Not at all  Social Connections: Unknown (09/10/2022)   Social Connection and Isolation  Panel [NHANES]    Frequency of Communication with Friends and Family: Once a week    Frequency of Social Gatherings with Friends and Family: Patient declined    Attends Religious Services: 1 to 4 times per year    Active Member of [employer] or Organizations: No    Attends Engineer, structural: Never    Marital Status: Married   Allergies  Allergen Reactions   Dexilant [Dexlansoprazole]     REACTION: pressure in ears   Prevacid [Lansoprazole]     REACTION: pressure in ears   Prilosec [Omeprazole]     REACTION: pressure in ears   Family History  Problem Relation Age of Onset   Stroke Father 79   Arthritis Mother        OA   Hypertension Mother    Hypothyroidism Mother    Stroke Mother 24       ischemic   Heart attack Sister 58       smoker   Ovarian cancer Sister    Osteopenia Sister    Hypertrophic cardiomyopathy Sister    COPD Sister    Heart failure Sister    Hypothyroidism Sister    Cardiomyopathy Sister    Emphysema Sister    Diabetes Maternal Grandmother    Colon cancer Neg Hx    Esophageal cancer Neg Hx    Liver disease Neg Hx    Stomach cancer Neg Hx    Rectal cancer Neg Hx     Current Outpatient Medications (Endocrine & Metabolic):    predniSONE (DELTASONE) 20 MG tablet, Take 1 tablet (20 mg total) by mouth daily with breakfast.      Current Outpatient Medications (Other):    pantoprazole (PROTONIX) 40 MG tablet, Take 40 mg by mouth daily.   tiZANidine (ZANAFLEX) 2 MG tablet, TAKE 1 TABLET BY MOUTH AT BEDTIME.    Objective  Blood pressure 130/80, pulse 74, height 5\' 2"  (1.575 m), weight 174 lb (78.9 kg), last menstrual period 05/27/2006, SpO2 96 %.   General: No apparent distress alert and oriented x3 mood and affect normal, dressed appropriately.  HEENT: Pupils equal, extraocular movements intact  Respiratory: Patient's speak in full sentences and does not appear short of breath  Cardiovascular: No lower extremity edema, non tender, no  erythema  Patient is sitting comfortably at this time.  Still has some tightness noted with FABER test on the right side.  Negative straight leg test.  Patient is able to walk without any significant difficulty.    Impression and Recommendations:     The above documentation has been reviewed and is accurate and complete Judi Saa, DO

## 2022-10-11 ENCOUNTER — Ambulatory Visit (INDEPENDENT_AMBULATORY_CARE_PROVIDER_SITE_OTHER): Payer: Medicare Other | Admitting: Family Medicine

## 2022-10-11 VITALS — BP 130/80 | HR 74 | Ht 62.0 in | Wt 174.0 lb

## 2022-10-11 DIAGNOSIS — M5416 Radiculopathy, lumbar region: Secondary | ICD-10-CM

## 2022-10-11 NOTE — Assessment & Plan Note (Signed)
Doing well at this time.  Discussed with her as well as her husband about that if any worsening pain could be a very good candidate for epidural.  Discussed the prognosis.  Patient's findings on MRI.  Increase activity slowly otherwise.  Follow-up with me again as needed

## 2022-10-22 DIAGNOSIS — K2970 Gastritis, unspecified, without bleeding: Secondary | ICD-10-CM | POA: Diagnosis not present

## 2022-10-22 DIAGNOSIS — K222 Esophageal obstruction: Secondary | ICD-10-CM | POA: Diagnosis not present

## 2022-10-22 DIAGNOSIS — Z09 Encounter for follow-up examination after completed treatment for conditions other than malignant neoplasm: Secondary | ICD-10-CM | POA: Diagnosis not present

## 2022-10-22 DIAGNOSIS — K449 Diaphragmatic hernia without obstruction or gangrene: Secondary | ICD-10-CM | POA: Diagnosis not present

## 2022-10-22 DIAGNOSIS — K2289 Other specified disease of esophagus: Secondary | ICD-10-CM | POA: Diagnosis not present

## 2022-10-22 DIAGNOSIS — K2270 Barrett's esophagus without dysplasia: Secondary | ICD-10-CM | POA: Diagnosis not present

## 2022-12-31 DIAGNOSIS — M1712 Unilateral primary osteoarthritis, left knee: Secondary | ICD-10-CM | POA: Diagnosis not present

## 2023-01-22 DIAGNOSIS — Z23 Encounter for immunization: Secondary | ICD-10-CM | POA: Diagnosis not present

## 2023-01-24 ENCOUNTER — Telehealth: Payer: Self-pay

## 2023-01-24 ENCOUNTER — Ambulatory Visit (INDEPENDENT_AMBULATORY_CARE_PROVIDER_SITE_OTHER): Payer: Medicare Other | Admitting: Obstetrics and Gynecology

## 2023-01-24 ENCOUNTER — Other Ambulatory Visit: Payer: Self-pay | Admitting: Family Medicine

## 2023-01-24 ENCOUNTER — Encounter: Payer: Self-pay | Admitting: Obstetrics and Gynecology

## 2023-01-24 VITALS — BP 118/82 | HR 77

## 2023-01-24 DIAGNOSIS — N814 Uterovaginal prolapse, unspecified: Secondary | ICD-10-CM

## 2023-01-24 DIAGNOSIS — N949 Unspecified condition associated with female genital organs and menstrual cycle: Secondary | ICD-10-CM | POA: Diagnosis not present

## 2023-01-24 DIAGNOSIS — N390 Urinary tract infection, site not specified: Secondary | ICD-10-CM | POA: Diagnosis not present

## 2023-01-24 LAB — WET PREP FOR TRICH, YEAST, CLUE

## 2023-01-24 MED ORDER — NITROFURANTOIN MONOHYD MACRO 100 MG PO CAPS: 100.0000 mg | ORAL_CAPSULE | Freq: Two times a day (BID) | ORAL | 0 refills | Status: DC

## 2023-01-24 MED ORDER — ESTRADIOL 0.1 MG/GM VA CREA: TOPICAL_CREAM | VAGINAL | 5 refills | Status: DC

## 2023-01-24 MED ORDER — FLUCONAZOLE 150 MG PO TABS: 150.0000 mg | ORAL_TABLET | Freq: Once | ORAL | 0 refills | Status: AC

## 2023-01-24 MED ORDER — ESTRADIOL 0.1 MG/GM VA CREA: 1.0000 | TOPICAL_CREAM | Freq: Every day | VAGINAL | 12 refills | Status: DC

## 2023-01-24 NOTE — Telephone Encounter (Signed)
Rx sent per Dr. Karma Greaser.   Pt notified and voiced appreciation.  Encounter closed.

## 2023-01-24 NOTE — Patient Instructions (Addendum)
It was such a pleasure seeing you today. Here is a wrap-up of the visit  A/p UTI, pelvic prolapse  Referral placed to Pelvic PT and urogyn for prolpase.  To also begin vaginal estrogen nightly for 2 weeks then 3 times a week thereafter Macrobid sent for 7 days Diflucan to take after the macrobid to prevent a yeast infection Looking forward to seeing you in November!  Safe travels Dr. Karma Greaser   Dr. Sheppard Plumber and Dr. Florian Buff at [facility] with [facility] office: [address], [address] [id number] [phone] The referral was placed but it may take a couple of weeks for the visit to be coordinated.  Pelvic Floor Therapy Referral Several locations: most commonly used is [facility] location Eulis Foster, PT [address] [place], [place] [phone]

## 2023-01-24 NOTE — Telephone Encounter (Signed)
Pt LVM in triage line requesting rx for estrace cream to be sent to [facility] pharmacy since cheaper [facility].   Rx pend.

## 2023-01-24 NOTE — Progress Notes (Signed)
    01/24/2023  Patient seen at bedside for complaints of pulling in her pelvis and burning and irritation. She reports she get this feeling annually when she goes to her daughter's condo and she has a pool.  She is leaving for a cruise and does not want to have this resolved before then, ideally. She denies any GU or GI complaints or VB.  Blood pressure 118/82, pulse 77, last menstrual period 05/27/2006, SpO2 98%.  SVE: pelvic prolapse stage 2 noted, atrophic vaginitis. No lesions seen on cervix. No abnormal discharge No CMT  UA with LE and WC Wet mount: neg  A/p UTI, pelvic prolapse  Referral placed to Pelvic PT and urogyn for prolpase.  To also begin vaginal estrogen nightly for 2 weeks then 3 times a week thereafter Macrobid sent for 7 days Diflucan to take after the macrobid to prevent a yeast infection  Dr. Karma Greaser

## 2023-01-27 LAB — URINALYSIS, COMPLETE W/RFL CULTURE
Bilirubin Urine: NEGATIVE
Glucose, UA: NEGATIVE
Hyaline Cast: NONE SEEN /LPF
Ketones, ur: NEGATIVE
Nitrites, Initial: NEGATIVE
Protein, ur: NEGATIVE
RBC / HPF: NONE SEEN /HPF (ref 0–2)
Specific Gravity, Urine: 1.02 (ref 1.001–1.035)
pH: 5 (ref 5.0–8.0)

## 2023-01-27 LAB — URINE CULTURE
MICRO NUMBER:: 15405341
SPECIMEN QUALITY:: ADEQUATE

## 2023-01-27 LAB — CULTURE INDICATED

## 2023-01-28 ENCOUNTER — Other Ambulatory Visit: Payer: Self-pay

## 2023-01-28 DIAGNOSIS — N760 Acute vaginitis: Secondary | ICD-10-CM

## 2023-01-28 DIAGNOSIS — N390 Urinary tract infection, site not specified: Secondary | ICD-10-CM

## 2023-01-28 MED ORDER — AMPICILLIN 500 MG PO CAPS
500.0000 mg | ORAL_CAPSULE | Freq: Two times a day (BID) | ORAL | 0 refills | Status: AC
Start: 2023-01-28 — End: 2023-02-04

## 2023-01-28 MED ORDER — FLUCONAZOLE 150 MG PO TABS
150.0000 mg | ORAL_TABLET | Freq: Once | ORAL | 0 refills | Status: AC
Start: 2023-01-28 — End: 2023-01-28

## 2023-01-28 MED ORDER — ESTRADIOL 0.1 MG/GM VA CREA: TOPICAL_CREAM | VAGINAL | 5 refills | Status: DC

## 2023-01-28 NOTE — Telephone Encounter (Signed)
FYI. [facility] due to [facility] price [facility]/ [facility] turning out to be cheaper there.   Rx resent and link to coupon forwarded to pt via mychart msg.   Routing to provider for review.

## 2023-01-28 NOTE — Addendum Note (Signed)
Addended by: Jodelle Red D on: 01/28/2023 12:12 PM   Modules accepted: Orders

## 2023-01-30 DIAGNOSIS — D3131 Benign neoplasm of right choroid: Secondary | ICD-10-CM | POA: Diagnosis not present

## 2023-02-21 ENCOUNTER — Ambulatory Visit: Payer: Medicare Other | Admitting: Obstetrics

## 2023-03-04 ENCOUNTER — Ambulatory Visit (INDEPENDENT_AMBULATORY_CARE_PROVIDER_SITE_OTHER): Payer: Medicare Other

## 2023-03-04 ENCOUNTER — Ambulatory Visit (INDEPENDENT_AMBULATORY_CARE_PROVIDER_SITE_OTHER): Payer: Medicare Other | Admitting: Internal Medicine

## 2023-03-04 ENCOUNTER — Encounter: Payer: Self-pay | Admitting: Internal Medicine

## 2023-03-04 VITALS — BP 130/80 | HR 82 | Temp 98.6°F | Ht 62.0 in

## 2023-03-04 DIAGNOSIS — R0789 Other chest pain: Secondary | ICD-10-CM

## 2023-03-04 DIAGNOSIS — R0602 Shortness of breath: Secondary | ICD-10-CM | POA: Diagnosis not present

## 2023-03-04 DIAGNOSIS — Z8616 Personal history of COVID-19: Secondary | ICD-10-CM | POA: Diagnosis not present

## 2023-03-04 DIAGNOSIS — R0609 Other forms of dyspnea: Secondary | ICD-10-CM | POA: Diagnosis not present

## 2023-03-04 DIAGNOSIS — R079 Chest pain, unspecified: Secondary | ICD-10-CM | POA: Diagnosis not present

## 2023-03-04 DIAGNOSIS — E782 Mixed hyperlipidemia: Secondary | ICD-10-CM | POA: Diagnosis not present

## 2023-03-04 LAB — CBC WITH DIFFERENTIAL/PLATELET
Basophils Absolute: 0 10*3/uL (ref 0.0–0.1)
Basophils Relative: 0.5 % (ref 0.0–3.0)
Eosinophils Absolute: 0.1 10*3/uL (ref 0.0–0.7)
Eosinophils Relative: 0.8 % (ref 0.0–5.0)
HCT: 44.1 % (ref 36.0–46.0)
Hemoglobin: 14.5 g/dL (ref 12.0–15.0)
Lymphocytes Relative: 31.6 % (ref 12.0–46.0)
Lymphs Abs: 2.5 10*3/uL (ref 0.7–4.0)
MCHC: 32.9 g/dL (ref 30.0–36.0)
MCV: 87.8 fL (ref 78.0–100.0)
Monocytes Absolute: 0.4 10*3/uL (ref 0.1–1.0)
Monocytes Relative: 4.8 % (ref 3.0–12.0)
Neutro Abs: 4.9 10*3/uL (ref 1.4–7.7)
Neutrophils Relative %: 62.3 % (ref 43.0–77.0)
Platelets: 321 10*3/uL (ref 150.0–400.0)
RBC: 5.03 Mil/uL (ref 3.87–5.11)
RDW: 13.7 % (ref 11.5–15.5)
WBC: 7.9 10*3/uL (ref 4.0–10.5)

## 2023-03-04 NOTE — Patient Instructions (Addendum)
    An EKG was done   Blood work was ordered.   Have a chest xray downstairs.    Medications changes include :   none    A referral was ordered for cardiology and someone will call you to schedule an appointment.     Return if symptoms worsen or fail to improve.

## 2023-03-04 NOTE — Assessment & Plan Note (Signed)
Acute Experiencing dyspnea with exertion since COVID Also is having some chest pain with exertion ?  Cardiac, pulmonary is possible but seems a little bit less likely Chest x-ray today EKG today-NSR at 69 bpm, possible LAE, questionable Q waves in lead III and aVF, but baseline is poor was difficult to know for sure.  This is new compared to EKG from 2023. Referral to cardiology CBC, CMP

## 2023-03-04 NOTE — Progress Notes (Signed)
Subjective:    Patient ID: Toni [name], female    DOB: 09-Jul-1945, 77 y.o.   MRN: [id number]      HPI Toni [name] is here for  Chief Complaint  Patient presents with   Fall    Patient fell 6 days ago. Left knee is banged up; bruised right arm   Post Covid breathing    Still having difficulty breathing/catching breath at times    Had covid 3 weeks ago.  Was taking mucinex.  Thought it was a cold.  Was feeling better - has foggy head and soreness in nose.  In chest has soreness and chest pain with exertion.  She also has shortness of breath with exertion.  Of the day she was mowing her lawn which is a push mower, but by the end she was getting chest pain and shortness of breath with exertion.  She only gets the chest pain with exertion.   She was concerned some the symptoms were side effects related to having COVID  Medications and allergies reviewed with patient and updated if appropriate.  Current Outpatient Medications on File Prior to Visit  Medication Sig Dispense Refill   estradiol (ESTRACE VAGINAL) 0.1 MG/GM vaginal cream Insert one gram intravaginally qhs for the first 2 weeks then use 3 times a week there after. 42.5 g 5   pantoprazole (PROTONIX) 40 MG tablet Take 40 mg by mouth daily.     tiZANidine (ZANAFLEX) 2 MG tablet TAKE 1 TABLET BY MOUTH EVERYDAY AT BEDTIME (Patient not taking: Reported on 03/04/2023) 90 tablet 1   No current facility-administered medications on file prior to visit.    Review of Systems  Constitutional:  Negative for fever.  Eyes:  Negative for visual disturbance.  Respiratory:  Positive for shortness of breath (soreness with exertion, soreness in chest). Negative for cough, chest tightness and wheezing.   Cardiovascular:  Positive for chest pain (soreness in chest with exertion). Negative for palpitations and leg swelling.  Neurological:  Positive for headaches (from fall). Negative for dizziness and light-headedness (when she bends over  and gets up).       Objective:   Vitals:   03/04/23 1451  BP: 130/80  Pulse: 82  Temp: 98.6 F (37 C)  SpO2: 94%   BP Readings from Last 3 Encounters:  03/04/23 130/80  01/24/23 118/82  10/11/22 130/80   Wt Readings from Last 3 Encounters:  10/11/22 174 lb (78.9 kg)  09/10/22 178 lb (80.7 kg)  08/13/22 178 lb (80.7 kg)   Body mass index is 31.83 kg/m.    Physical Exam Constitutional:      General: She is not in acute distress.    Appearance: Normal appearance.  HENT:     Head: Normocephalic and atraumatic.     Mouth/Throat:     Mouth: Mucous membranes are moist.     Pharynx: No posterior oropharyngeal erythema.  Eyes:     Conjunctiva/sclera: Conjunctivae normal.  Cardiovascular:     Rate and Rhythm: Normal rate and regular rhythm.     Heart sounds: Normal heart sounds.  Pulmonary:     Effort: Pulmonary effort is normal. No respiratory distress.     Breath sounds: Normal breath sounds. No wheezing.  Chest:     Chest wall: No tenderness.  Musculoskeletal:     Cervical back: Neck supple.     Right lower leg: No edema.     Left lower leg: No edema.  Lymphadenopathy:     Cervical:  No cervical adenopathy.  Skin:    General: Skin is warm and dry.     Findings: Bruising (Right upper arm from fall, left knee from fall) present. No rash.  Neurological:     Mental Status: She is alert. Mental status is at baseline.  Psychiatric:        Mood and Affect: Mood normal.        Behavior: Behavior normal.        Chest x-ray results pending.  I did review the chest x-ray myself and no evidence of pneumonia, effusion or pneumothorax.  Will wait for official read by radiology.    Assessment & Plan:    See Problem List for Assessment and Plan of chronic medical problems.    I spent 20 minutes dedicated to the care of this patient on the date of this encounter including review of last labs, imaging and procedures-including echocardiogram from 2023, obtaining  history, communicating with the patient, ordering referral, tests, and documenting clinical information in the EHR

## 2023-03-04 NOTE — Assessment & Plan Note (Signed)
Acute Experiencing dyspnea with exertion and chest pain with exertion ?  Cardiac, pulmonary is possible but seems a little bit less likely Chest x-ray today EKG today-NSR at 69 bpm, possible LAE, questionable Q waves in lead III and aVF, but baseline is poor was difficult to know for sure.  This is new compared to EKG from 2023. Referral to cardiology CBC, CMP

## 2023-03-04 NOTE — Assessment & Plan Note (Signed)
Chronic Check lipid panel, CMP Continue lifestyle controlled

## 2023-03-05 LAB — LIPID PANEL
Cholesterol: 227 mg/dL — ABNORMAL HIGH (ref 0–200)
HDL: 75.9 mg/dL (ref >39.00)
LDL Cholesterol: 119 mg/dL — ABNORMAL HIGH (ref 0–99)
NonHDL: 151.22
Total CHOL/HDL Ratio: 3
Triglycerides: 160 mg/dL — ABNORMAL HIGH (ref 0.0–149.0)
VLDL: 32 mg/dL (ref 0.0–40.0)

## 2023-03-05 LAB — COMPREHENSIVE METABOLIC PANEL
ALT: 10 U/L (ref 0–35)
AST: 13 U/L (ref 0–37)
Albumin: 4.4 g/dL (ref 3.5–5.2)
Alkaline Phosphatase: 95 U/L (ref 39–117)
BUN: 21 mg/dL (ref 6–23)
CO2: 31 meq/L (ref 19–32)
Calcium: 10 mg/dL (ref 8.4–10.5)
Chloride: 103 meq/L (ref 96–112)
Creatinine, Ser: 0.88 mg/dL (ref 0.40–1.20)
GFR: 64.19 mL/min (ref >60.00)
Glucose, Bld: 102 mg/dL — ABNORMAL HIGH (ref 70–99)
Potassium: 4.6 meq/L (ref 3.5–5.1)
Sodium: 140 meq/L (ref 135–145)
Total Bilirubin: 0.6 mg/dL (ref 0.2–1.2)
Total Protein: 7 g/dL (ref 6.0–8.3)

## 2023-03-10 ENCOUNTER — Encounter: Payer: Self-pay | Admitting: Obstetrics

## 2023-03-10 ENCOUNTER — Ambulatory Visit (INDEPENDENT_AMBULATORY_CARE_PROVIDER_SITE_OTHER): Payer: Medicare Other | Admitting: Obstetrics

## 2023-03-10 VITALS — BP 165/85 | HR 75 | Ht 61.97 in | Wt 177.0 lb

## 2023-03-10 DIAGNOSIS — N816 Rectocele: Secondary | ICD-10-CM | POA: Diagnosis not present

## 2023-03-10 DIAGNOSIS — R030 Elevated blood-pressure reading, without diagnosis of hypertension: Secondary | ICD-10-CM | POA: Diagnosis not present

## 2023-03-10 DIAGNOSIS — N952 Postmenopausal atrophic vaginitis: Secondary | ICD-10-CM | POA: Diagnosis not present

## 2023-03-10 NOTE — Assessment & Plan Note (Signed)
For treatment of pelvic organ prolapse, we discussed options for management including expectant management, conservative management, and surgical management, such as Kegels, a pessary, pelvic floor physical therapy, and specific surgical procedures. - asymptomatic - encouraged Kegel exercises with handout and instructions reviewed

## 2023-03-10 NOTE — Progress Notes (Unsigned)
Cardiology Office Note Date:  03/11/2023  Patient ID:  Toni, [name] 17-Dec-1945, MRN [id number] PCP:  Pincus Sanes, MD  Cardiologist:  None Electrophysiologist: Hillis Range, MD > Sherryl Manges, MD    Chief Complaint: SOB  History of Present Illness: Toni [name] is a 77 y.o. female with PMH notable for SVT, PVC, SOB; seen today for Sherryl Manges, MD for acute visit due to CP and SOB with bending over.   She is s/p SVT ablation 2015 with Dr. Johney Frame She last saw Dr. Graciela Husbands 07/2021 - having DOE on stairs and slight incline, no edema. She has positive familial history of HCM. He recommended updated echo and genetic testing. Updated TTE 08/2021 showed moderate LVH. She saw Dr. Jomarie Longs 11/2021 who thought it likely that patient's sister had de novo mutation that was inherited by her children and grandchildren, but that genetic testing of patient was not recommended though could be performed. No further follow-up with Dr. Jomarie Longs.  Today, she tells me that she had Covid about a month ago. Since then, she has increased SOB, chest pain with exertion (like mowing the lawn), and bendopnea. She had some degree of SOB and bendopnea prior to covid, but it is more pronounced since. She does not have chest pain at rest. Denies edema, weight gain, or early satiety. No syncope.  She does not check BP regularly at home, but it was elevated at appt yesterday in 160s/80s. She has never been diagnosed with HTN. Has never smoked, no h/o diabetes.     Past Medical History:  Diagnosis Date   Aortic sclerosis 2000   on ECHO   Arthritis    knees   Colon polyps    Dr Jarold Motto   Diverticulosis of colon (without mention of hemorrhage)    Esophageal reflux    Eye abnormality    OD ocular freckle   H/O hiatal hernia    Hyperlipidemia 1999   LDL 158, resolved with diet   Premature ventricular contractions    Stricture and stenosis of esophagus    dilation X 3   SVT (supraventricular  tachycardia) (HCC)    slow pathway ablated    Past Surgical History:  Procedure Laterality Date   ANKLE SURGERY     right   COLONOSCOPY  10/14/2014   pyrtle   COLONOSCOPY W/ POLYPECTOMY  2011   Dr Jarold Motto   KNEE ARTHROSCOPY     bilateral   ovarian wedge resection     to allow pregnancy   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   SUPRAVENTRICULAR TACHYCARDIA ABLATION N/A 06/08/2013   Procedure: SUPRAVENTRICULAR TACHYCARDIA ABLATION;  Surgeon: Gardiner Rhyme, MD;  Location: [facility];  Service: Cardiovascular;  Laterality: N/A;   TONSILLECTOMY     UPPER GASTROINTESTINAL ENDOSCOPY     [facility] yearly   wisdom teeth ext      Current Outpatient Medications  Medication Instructions   Cholecalciferol (VITAMIN D) 50 MCG (2000 UT) CAPS Oral   estradiol (ESTRACE VAGINAL) 0.1 MG/GM vaginal cream Insert one gram intravaginally qhs for the first 2 weeks then use 3 times a week there after.   pantoprazole (PROTONIX) 40 mg, Oral, Daily   tiZANidine (ZANAFLEX) 2 MG tablet TAKE 1 TABLET BY MOUTH EVERYDAY AT BEDTIME   vitamin C 1,000 mg, Oral, Daily    Social History:  The patient  reports that she has never smoked. She has never used smokeless tobacco. She reports that she does not currently use alcohol.  She reports that she does not use drugs.   Family History:  The patient's family history includes Arthritis in her mother; COPD in her sister; Cardiomyopathy in her sister; Diabetes in her maternal grandmother; Emphysema in her sister; Heart attack (age of onset: 34) in her sister; Heart failure in her sister; Hypertension in her mother; Hypertrophic cardiomyopathy in her sister; Hypothyroidism in her mother and sister; Osteopenia in her sister; Ovarian cancer in her sister; Stroke (age of onset: 23) in her father; Stroke (age of onset: 31) in her mother.  ROS:  Please see the history of present illness. All other systems are reviewed and otherwise negative.   PHYSICAL EXAM:  VS:   BP (!) 144/80 (BP Location: Left Arm, Patient Position: Sitting, Cuff Size: Normal)   Pulse 64   Ht 5\' 2"  (1.575 m)   Wt 179 lb (81.2 kg)   LMP 05/27/2006 (LMP Unknown)   SpO2 97%   BMI 32.74 kg/m  BMI: Body mass index is 32.74 kg/m.  GEN- The patient is well appearing, alert and oriented x 3 today.   Lungs- Clear to ausculation bilaterally, normal work of breathing.  Heart- Regular rate and rhythm, no murmurs, rubs or gallops Extremities- No peripheral edema, warm, dry   EKG is not ordered. Personal review of EKG from  03/04/2023  shows:  NSR, rate 72bpm QRS 72ms       Recent Labs: 03/04/2023: ALT 10; BUN 21; Creatinine, Ser 0.88; Hemoglobin 14.5; Platelets 321.0; Potassium 4.6; Sodium 140  03/04/2023: Cholesterol 227; HDL 75.90; LDL Cholesterol 119; Total CHOL/HDL Ratio 3; Triglycerides 160.0; VLDL 32.0   Estimated Creatinine Clearance: 54.5 mL/min (by C-G formula based on SCr of 0.88 mg/dL).   Wt Readings from Last 3 Encounters:  03/11/23 179 lb (81.2 kg)  03/10/23 177 lb (80.3 kg)  10/11/22 174 lb (78.9 kg)     Additional studies reviewed include: Previous EP, cardiology notes.   TTE, 09/11/2021  1. Left ventricular ejection fraction, by estimation, is 70 to 75%. The left ventricle has hyperdynamic function. The left ventricle has no regional wall motion abnormalities. There is moderate left ventricular hypertrophy. Left ventricular diastolic parameters are consistent with Grade I diastolic dysfunction (impaired  relaxation).   2. Right ventricular systolic function is normal. The right ventricular size is normal.   3. The mitral valve is normal in structure. Mild mitral valve regurgitation. No evidence of mitral stenosis.   4. The aortic valve is tricuspid. Aortic valve regurgitation is not visualized. Aortic valve sclerosis is present, with no evidence of aortic valve stenosis.   5. The inferior vena cava is normal in size with greater than 50% respiratory variability,  suggesting right atrial pressure of 3 mmHg.   Comparison(s): No prior Echocardiogram.     ASSESSMENT AND PLAN:  #) mild-Moderate LVH #) SOB #) Chest Pain w exertion Patient had long-standing baseline SOB, but SOB has increased since recent covid diagnsos and is now associated with chest pain with exertion Recommended coronary CTA to eval cors and ventricular thickness If coronaries are abnormal, will proceed with LHC If ventricular thickness is abnormal, proceed with cardiac MRI vs referral to Dr. Izora Ribas Two week monitor to eval for arrhythmia Update fasting lipids  #) SVT S/p SVT ablation No reoccurrence  #) elevated BP reading without HTN Recommended patient measure BP 3-4 times per week and record measurements. Bring recordings to follow-up appts   Current medicines are reviewed at length with the patient today.   The patient  does not have concerns regarding her medicines.  The following changes were made today:  none  Labs/ tests ordered today include:  Orders Placed This Encounter  Procedures   CT CORONARY MORPH W/CTA COR W/SCORE W/CA W/CM &/OR WO/CM   Lipid panel     Disposition: Follow up with Dr. Graciela Husbands   as scheduled in November    Signed, Toni Sherrow, NP  03/11/23  1:04 PM  Electrophysiology [facility]

## 2023-03-10 NOTE — Progress Notes (Signed)
New Patient Evaluation and Consultation  Referring Provider: Pincus Sanes, MD PCP: Pincus Sanes, MD Date of Service: 03/10/2023  SUBJECTIVE Chief Complaint: New Patient (Initial Visit) (Dr Karma Greaser referral - pelvic prolapse noticed by her, recent UTI without symptoms, feels like she empties bladder )  History of Present Illness: Toni [name] is a 77 y.o. White or Caucasian female seen in consultation at the request of Dr Karma Greaser for evaluation of pelvic organ prolapse.    Stage II pelvic organ prolapse noted on exam 01/24/23, reports pulling in pelvis with burning and irritation after pool use. Referral placed for pelvic floor PT Rx macrobid for strep agalactiae on urine culture, sample contaminated with skin cells. Vaginal irritation resolved after macrobid.  Rx provided for vaginal estrogen, started but discontinued due to concerns of breast cancer.   Review of records significant for: SVT, aortic sclerosis, diverticulosis  Urinary Symptoms: Does not leak urine.   Day time voids 3-4.  Nocturia: 0-1 times per night to void. Voiding dysfunction:  empties bladder well.  Patient does not use a catheter to empty bladder.  When urinating, patient feels she has no difficulties Drinks: <64 oz water per day  UTIs: 1 UTI's in the last year.   Denies history of blood in urine, kidney or bladder stones, pyelonephritis, bladder cancer, and kidney cancer No results found for the last 90 days.   Pelvic Organ Prolapse Symptoms:                  Patient Denies a feeling of a bulge the vaginal area.  Patient Denies seeing a bulge.  This bulge is not bothersome.  Bowel Symptom: Bowel movements: 1 time(s) per day Stool consistency: solid or soft  Straining: no.  Splinting: no.  Incomplete evacuation: no.  Patient Denies accidental bowel leakage / fecal incontinence Bowel regimen: diet Last colonoscopy:  [facility] Colonoscopy          Colonoscopy (Every 5 Years) Next due on  12/05/2024    12/06/2019  COLONOSCOPY   Only the first 1 history entries have been loaded, but more history exists.            Sexual Function Sexually active: yes.  Sexual orientation: Straight Pain with sex: No  Pelvic Pain Denies pelvic pain   Past Medical History:  Past Medical History:  Diagnosis Date   Aortic sclerosis 2000   on ECHO   Arthritis    knees   Colon polyps    Dr Jarold Motto   Diverticulosis of colon (without mention of hemorrhage)    Esophageal reflux    Eye abnormality    OD ocular freckle   H/O hiatal hernia    Hyperlipidemia 1999   LDL 158, resolved with diet   Premature ventricular contractions    Stricture and stenosis of esophagus    dilation X 3   SVT (supraventricular tachycardia) (HCC)    slow pathway ablated     Past Surgical History:   Past Surgical History:  Procedure Laterality Date   ANKLE SURGERY     right   COLONOSCOPY  10/14/2014   pyrtle   COLONOSCOPY W/ POLYPECTOMY  2011   Dr Jarold Motto   KNEE ARTHROSCOPY     bilateral   ovarian wedge resection     to allow pregnancy   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   SUPRAVENTRICULAR TACHYCARDIA ABLATION N/A 06/08/2013   Procedure: SUPRAVENTRICULAR TACHYCARDIA ABLATION;  Surgeon: Gardiner Rhyme, MD;  Location: [facility];  Service: Cardiovascular;  Laterality: N/A;   TONSILLECTOMY     UPPER GASTROINTESTINAL ENDOSCOPY     [facility] yearly   wisdom teeth ext       Past OB/GYN History: OB History  Gravida Para Term Preterm AB Living  2 2       2   SAB IAB Ectopic Multiple Live Births               # Outcome Date GA Lbr Len/2nd Weight Sex Type Anes PTL Lv  2 Para           1 Para             Obstetric Comments  7lbs 12oz & 6lbs 10oz    Vaginal deliveries: 2, largest infant 7lb 10oz,  Forceps/ Vacuum deliveries: 1 with forceps, Cesarean section: 0 Menopausal: Yes, at age 29 Contraception: none. Last pap smear was NILM.  Any history of abnormal pap smears:  no.    Component Value Date/Time   DIAGPAP  04/16/2021 1029    - Negative for intraepithelial lesion or malignancy (NILM)   ADEQPAP  04/16/2021 1029    Satisfactory for evaluation; transformation zone component PRESENT.    Medications: Patient has a current medication list which includes the following prescription(s): vitamin c, vitamin d, pantoprazole, estradiol, and tizanidine.   Allergies: Patient is allergic to dexilant [dexlansoprazole], prevacid [lansoprazole], and prilosec [omeprazole].   Social History:  Social History   Tobacco Use   Smoking status: Never   Smokeless tobacco: Never  Vaping Use   Vaping status: Never Used  Substance Use Topics   Alcohol use: Not Currently   Drug use: No    Relationship status: married Patient lives with her husabnd.   Patient is not employed. Regular exercise: No History of abuse: No  Family History:   Family History  Problem Relation Age of Onset   Stroke Father 35   Arthritis Mother        OA   Hypertension Mother    Hypothyroidism Mother    Stroke Mother 59       ischemic   Heart attack Sister 46       smoker   Ovarian cancer Sister    Osteopenia Sister    Hypertrophic cardiomyopathy Sister    COPD Sister    Heart failure Sister    Hypothyroidism Sister    Cardiomyopathy Sister    Emphysema Sister    Diabetes Maternal Grandmother    Colon cancer Neg Hx    Esophageal cancer Neg Hx    Liver disease Neg Hx    Stomach cancer Neg Hx    Rectal cancer Neg Hx      Review of Systems: Review of Systems  Constitutional:  Negative for fever, malaise/fatigue and weight loss.  Respiratory:  Positive for shortness of breath. Negative for cough and wheezing.   Cardiovascular:  Positive for palpitations. Negative for chest pain and leg swelling.  Gastrointestinal:  Negative for abdominal pain and blood in stool.  Genitourinary:  Negative for hematuria.  Skin:  Negative for rash.  Neurological:  Negative for dizziness,  weakness and headaches.  Endo/Heme/Allergies:  Bruises/bleeds easily.  Psychiatric/Behavioral:  Negative for depression. The patient is not nervous/anxious.      OBJECTIVE Physical Exam: Vitals:   03/10/23 1255  BP: (!) 165/85  Pulse: 75  Weight: 177 lb (80.3 kg)  Height: 5' 1.97" (1.574 m)    Physical Exam Vitals reviewed. Exam conducted with a chaperone present.  Constitutional:      General: She is not in acute distress. Cardiovascular:     Rate and Rhythm: Normal rate.  Pulmonary:     Effort: Pulmonary effort is normal. No respiratory distress.  Abdominal:     General: There is no distension.     Palpations: Abdomen is soft.     Tenderness: There is no abdominal tenderness.    Genitourinary:    Labia:        Right: No rash, tenderness, lesion or injury.        Left: No rash, tenderness, lesion or injury.      Urethra: No prolapse, urethral pain, urethral swelling or urethral lesion.     Vagina: No signs of injury and foreign body. No vaginal discharge, erythema, tenderness, bleeding or lesions.     Cervix: No cervical motion tenderness, discharge, friability, lesion, erythema or cervical bleeding.     Uterus: Not deviated, not enlarged, not fixed and not tender.      Adnexa:        Right: No mass, tenderness or fullness.         Left: No mass, tenderness or fullness.    Neurological:     Mental Status: She is alert.      GU / Detailed Urogynecologic Evaluation:  Pelvic Exam: Normal external female genitalia; Bartholin's and Skene's glands normal in appearance; urethral meatus normal in appearance, no urethral masses or discharge.   CST: negative  Reflexes: bulbocavernosis present, anocutaneous present bilaterally.  Speculum exam reveals normal vaginal mucosa with atrophy.   With apex supported, anterior compartment defect was reduced  Pelvic floor strength IV/V, puborectalis IV/V   Pelvic floor musculature: Right levator non-tender, Right obturator  non-tender, Left levator non-tender, Left obturator non-tender  POP-Q:   POP-Q  -2                                            Aa   -2                                           Ba  -5                                              C   2                                            Gh  3                                            Pb  8                                            tvl   -1  Ap  -1                                            Bp  -6                                              D    Post-Void Residual (PVR) by Bladder Scan: In order to evaluate bladder emptying, we discussed obtaining a postvoid residual and patient agreed to this procedure.  Procedure: The ultrasound unit was placed on the patient's abdomen in the suprapubic region after the patient had voided.    Post Void Residual - 03/10/23 1306       Post Void Residual   Post Void Residual 19 mL            Laboratory Results: Lab Results  Component Value Date   BILIRUBINUR NEGATIVE 01/26/2021   PROTEINUR NEGATIVE 01/24/2023   UROBILINOGEN 0.2 01/26/2021   LEUKOCYTESUR NEGATIVE 02/06/2022    Lab Results  Component Value Date   CREATININE 0.88 03/04/2023   CREATININE 0.65 08/17/2021   CREATININE 0.76 09/13/2020    Lab Results  Component Value Date   HGBA1C 5.3 08/17/2021    Lab Results  Component Value Date   HGB 14.5 03/04/2023     ASSESSMENT AND PLAN Toni [name] is a 77 y.o. with:  1. Pelvic organ prolapse quantification stage 2 rectocele   2. Vaginal atrophy   3. Elevated BP without diagnosis of hypertension    Pelvic organ prolapse quantification stage 2 rectocele Assessment & Plan: For treatment of pelvic organ prolapse, we discussed options for management including expectant management, conservative management, and surgical management, such as Kegels, a pessary, pelvic floor physical therapy, and specific surgical procedures. -  asymptomatic - encouraged Kegel exercises with handout and instructions reviewed   Vaginal atrophy Assessment & Plan: - For symptomatic vaginal atrophy options include lubrication with a water-based lubricant, personal hygiene measures and barrier protection against wetness, and estrogen replacement in the form of vaginal cream, vaginal tablets, or a time-released vaginal ring.   - encouraged topical barrier ointment around pool use due to exacerbation of symptoms - encouraged to resume vaginal estrogen if persistent symptoms - reassured patient regarding safety information of vaginal estrogen, unlike oral estrogen   Elevated BP without diagnosis of hypertension Assessment & Plan: - pending cardiology evaluation due to intermittent DOE since COVID - pt denies elevated BP at home - encouraged repeat at home and follow-up, urgent evaluation if she develops chest pain, SOB, or headache.    Time spent: I spent 52 minutes dedicated to the care of this patient on the date of this encounter to include pre-visit review of records, face-to-face time with the patient discussing pelvic organ prolapse, vaginal atrophy and post visit documentation.   Loleta Chance, MD

## 2023-03-10 NOTE — Patient Instructions (Signed)
You have a stage 2 (out of 4) prolapse.  We discussed the fact that it is not life threatening but there are several treatment options. For treatment of pelvic organ prolapse, we discussed options for management including expectant management, conservative management, and surgical management, such as Kegels, a pessary, pelvic floor physical therapy, and specific surgical procedures.     Start Kegel exercises  Trial of topical Vaseline prior and after pool use to reassess symptoms.  If no symptomatic relief, restart vaginal estrogen therapy nightly for two weeks then 2 times weekly at night for treatment of vaginal atrophy (dryness of the vaginal tissues).  Please let us know if the prescription is too expensive and we can look for alternative options.    Vulvovaginal moisturizer Options: Vitamin E oil (pump or capsule) or cream (Gene's Vit E Cream) Coconut oil Silicone-based lubricant for use during intercourse ("wet platinum" is a brand available at most drugstores) Crisco Consider the ingredients of the product - the fewer the ingredients the better!  Directions for Use: Clean and dry your hands Gently dab the vulvar/vaginal area dry as needed Apply a "pea-sized" amount of the moisturizer onto your fingertip Using you other hand, open the labia  Apply the moisturizer to the vulvar/vaginal tissues Wear loose fitting underwear/clothing if possible following application Use moisturize up to 3 times daily as desired.

## 2023-03-10 NOTE — Assessment & Plan Note (Signed)
-   For symptomatic vaginal atrophy options include lubrication with a water-based lubricant, personal hygiene measures and barrier protection against wetness, and estrogen replacement in the form of vaginal cream, vaginal tablets, or a time-released vaginal ring.   - encouraged topical barrier ointment around pool use due to exacerbation of symptoms - encouraged to resume vaginal estrogen if persistent symptoms - reassured patient regarding safety information of vaginal estrogen, unlike oral estrogen

## 2023-03-10 NOTE — Assessment & Plan Note (Signed)
-   pending cardiology evaluation due to intermittent DOE since COVID - pt denies elevated BP at home - encouraged repeat at home and follow-up, urgent evaluation if she develops chest pain, SOB, or headache.

## 2023-03-11 ENCOUNTER — Encounter: Payer: Self-pay | Admitting: Cardiology

## 2023-03-11 ENCOUNTER — Ambulatory Visit: Payer: Medicare Other | Attending: Cardiology | Admitting: Cardiology

## 2023-03-11 VITALS — BP 144/80 | HR 64 | Ht 62.0 in | Wt 179.0 lb

## 2023-03-11 DIAGNOSIS — Z8249 Family history of ischemic heart disease and other diseases of the circulatory system: Secondary | ICD-10-CM | POA: Insufficient documentation

## 2023-03-11 DIAGNOSIS — I517 Cardiomegaly: Secondary | ICD-10-CM | POA: Diagnosis not present

## 2023-03-11 DIAGNOSIS — I471 Supraventricular tachycardia, unspecified: Secondary | ICD-10-CM | POA: Diagnosis not present

## 2023-03-11 DIAGNOSIS — R079 Chest pain, unspecified: Secondary | ICD-10-CM | POA: Insufficient documentation

## 2023-03-11 NOTE — Patient Instructions (Addendum)
Medication Instructions:   Your physician recommends that you continue on your current medications as directed. Please refer to the Current Medication list given to you today.  *If you need a refill on your cardiac medications before your next appointment, please call your pharmacy*   Lab Work:  Your provider would like for you to return when Available to have the following labs drawn: LIPID.   Please go to [facility] [address] ([facility] [address], [place] [address] You do not need an appointment.  They are open from 7:30 am-4 pm.  Lunch from 1:00 pm- 2:00 pm You WILL need to be fasting.     If you have labs (blood work) drawn today and your tests are completely normal, you will receive your results only by: MyChart Message (if you have MyChart) OR A paper copy in the mail If you have any lab test that is abnormal or we need to change your treatment, we will call you to review the results.   Testing/Procedures:    Your cardiac CT will be scheduled at one of the below locations:   [facility] [address] [place], [place] [address] [phone]  OR   [facility] [address] [place], [place] [address] [phone]  If scheduled at [facility] or [facility], please arrive 15 mins early for check-in and test prep.  There is spacious parking and easy access to the radiology department from the [facility] entrance. Please enter here and check-in with the desk attendant.   Please follow these instructions carefully (unless otherwise directed):  An IV will be required for this test and Nitroglycerin will be given.  Hold all erectile dysfunction medications at least 3 days (72 hrs) prior to test. (Ie viagra, cialis, sildenafil, tadalafil, etc)   On the Day of the Test: Drink plenty of water until 1 hour prior  to the test. Do not eat any food 1 hour prior to test. You may take your regular medications prior to the test.  FEMALES- please wear underwire-free bra if available, avoid dresses & tight clothing  After the Test: Drink plenty of water. After receiving IV contrast, you may experience a mild flushed feeling. This is normal. On occasion, you may experience a mild rash up to 24 hours after the test. This is not dangerous. If this occurs, you can take Benadryl 25 mg and increase your fluid intake. If you experience trouble breathing, this can be serious. If it is severe call 911 IMMEDIATELY. If it is mild, please call our office. If you take any of these medications: Glipizide/Metformin, Avandament, Glucavance, please do not take 48 hours after completing test unless otherwise instructed.  We will call to schedule your test 2-4 weeks out understanding that some insurance companies will need an authorization prior to the service being performed.   For more information and frequently asked questions, please visit our website : [email or web address]  For non-scheduling related questions, please contact the cardiac imaging nurse navigator should you have any questions/concerns: Cardiac Imaging Nurse Navigators Direct Office Dial: [phone]   For scheduling needs, including cancellations and rescheduling, please call [place], [phone].    Follow-Up: At [facility], you and your health needs are our priority.  As part of our continuing mission to provide you with exceptional heart care, we have created designated Provider Care Teams.  These Care Teams include your primary Cardiologist (physician)  and Advanced Practice Providers (APPs -  Physician Assistants and Nurse Practitioners) who all work together to provide you with the care you need, when you need it.  We recommend signing up for the patient portal called "MyChart".  Sign up information is provided on this  After Visit Summary.  MyChart is used to connect with patients for Virtual Visits (Telemedicine).  Patients are able to view lab/test results, encounter notes, upcoming appointments, etc.  Non-urgent messages can be sent to your provider as well.   To learn more about what you can do with MyChart, go to [email or web address].    Your next appointment:    As already scheduled with Dr. Graciela Husbands

## 2023-03-18 ENCOUNTER — Encounter: Payer: Self-pay | Admitting: Internal Medicine

## 2023-03-18 ENCOUNTER — Other Ambulatory Visit: Payer: Self-pay

## 2023-03-18 ENCOUNTER — Other Ambulatory Visit (INDEPENDENT_AMBULATORY_CARE_PROVIDER_SITE_OTHER): Payer: Medicare Other

## 2023-03-18 DIAGNOSIS — E782 Mixed hyperlipidemia: Secondary | ICD-10-CM | POA: Diagnosis not present

## 2023-03-18 LAB — LIPID PANEL
Cholesterol: 188 mg/dL (ref 0–200)
HDL: 66.9 mg/dL (ref >39.00)
LDL Cholesterol: 109 mg/dL — ABNORMAL HIGH (ref 0–99)
NonHDL: 121.04
Total CHOL/HDL Ratio: 3
Triglycerides: 60 mg/dL (ref 0.0–149.0)
VLDL: 12 mg/dL (ref 0.0–40.0)

## 2023-03-20 ENCOUNTER — Ambulatory Visit: Payer: Medicare Other | Attending: Cardiology

## 2023-03-20 ENCOUNTER — Telehealth (HOSPITAL_COMMUNITY): Payer: Self-pay | Admitting: *Deleted

## 2023-03-20 ENCOUNTER — Other Ambulatory Visit: Payer: Self-pay

## 2023-03-20 DIAGNOSIS — I471 Supraventricular tachycardia, unspecified: Secondary | ICD-10-CM

## 2023-03-20 NOTE — Telephone Encounter (Signed)
Reaching out to patient to offer assistance regarding upcoming cardiac imaging study; pt verbalizes understanding of appt date/time, parking situation and where to check in, pre-test NPO status, and verified current allergies; name and call back number provided for further questions should they arise  Larey Brick RN Navigator Cardiac Imaging [facility] [phone] office [phone] cell  Patient confirms resting HR in the mid 60's and is aware to arrive at 1:30 PM.

## 2023-03-21 ENCOUNTER — Encounter: Payer: Self-pay | Admitting: Internal Medicine

## 2023-03-24 ENCOUNTER — Ambulatory Visit (HOSPITAL_COMMUNITY)
Admission: RE | Admit: 2023-03-24 | Discharge: 2023-03-24 | Disposition: A | Discharge status: Home / Self Care | Payer: Medicare Other | Source: Ambulatory Visit | Attending: Cardiology | Admitting: Cardiology

## 2023-03-24 DIAGNOSIS — R079 Chest pain, unspecified: Secondary | ICD-10-CM | POA: Diagnosis not present

## 2023-03-24 DIAGNOSIS — I517 Cardiomegaly: Secondary | ICD-10-CM | POA: Insufficient documentation

## 2023-03-24 MED ORDER — NITROGLYCERIN 0.4 MG SL SUBL
SUBLINGUAL_TABLET | SUBLINGUAL | Status: AC
Start: 2023-03-24 — End: ?
  Filled 2023-03-24: qty 2

## 2023-03-24 MED ORDER — IOHEXOL 350 MG/ML SOLN
95.0000 mL | Freq: Once | INTRAVENOUS | Status: AC | PRN
  Administered 2023-03-24: 95 mL via INTRAVENOUS

## 2023-03-24 MED ORDER — NITROGLYCERIN 0.4 MG SL SUBL
0.8000 mg | SUBLINGUAL_TABLET | Freq: Once | SUBLINGUAL | Status: AC
  Administered 2023-03-24: 0.8 mg via SUBLINGUAL

## 2023-04-17 ENCOUNTER — Encounter: Payer: Self-pay | Admitting: Family Medicine

## 2023-04-21 ENCOUNTER — Encounter: Payer: Medicare Other | Admitting: Obstetrics and Gynecology

## 2023-04-22 DIAGNOSIS — M1712 Unilateral primary osteoarthritis, left knee: Secondary | ICD-10-CM | POA: Diagnosis not present

## 2023-04-23 ENCOUNTER — Encounter: Payer: Self-pay | Admitting: Internal Medicine

## 2023-04-23 ENCOUNTER — Ambulatory Visit: Payer: Medicare Other | Attending: Internal Medicine | Admitting: Internal Medicine

## 2023-04-23 VITALS — BP 152/84 | HR 69 | Ht 62.0 in | Wt 174.6 lb

## 2023-04-23 DIAGNOSIS — I471 Supraventricular tachycardia, unspecified: Secondary | ICD-10-CM | POA: Diagnosis not present

## 2023-04-23 DIAGNOSIS — Z8249 Family history of ischemic heart disease and other diseases of the circulatory system: Secondary | ICD-10-CM | POA: Diagnosis not present

## 2023-04-23 MED ORDER — AMLODIPINE BESYLATE 2.5 MG PO TABS: 2.5000 mg | ORAL_TABLET | Freq: Every day | ORAL | 3 refills | Status: DC

## 2023-04-23 NOTE — Progress Notes (Signed)
Patient Care Team: Pincus Sanes, MD as PCP - General (Internal Medicine) Duke Salvia, MD as PCP - Electrophysiology (Cardiology) Davina Poke as Consulting Physician (Optometry) Pyrtle, Carie Caddy, MD as Consulting Physician (Gastroenterology) Enrigue Catena Ronda Fairly, MD as Consulting Physician (Gastroenterology) Genia Del, MD as Consulting Physician (Obstetrics and Gynecology) Judi Saa, DO as Consulting Physician (Family Medicine)   HPI  Toni [name] is a 77 y.o. female Seen in follow-up for dyspnea on exertion in the setting of mild left ventricular hypertrophy  Interval COVID Afterwards, developed chest pain and dyspnea with bendopnea.  Saw SR-NP undertook evaluation as below.  Symptoms have abated.  Denies dyspnea.  Noted to have elevated blood pressure brings in blood pressure recordings over the last month which are in average are over 140-100 50s  No edema nocturnal dyspnea orthopnea.   DATE TEST EF    3/13 Echo   60-65 % MR mild LVH mild 12 mm   4/23 Echo  70-75%  LVH 12 mm  10/24 CTA  CaScore 38    Date Cr K Hgb  3/23 0.64 3.7 12.9   10/24 0.88 4.6 14.5    Records and Results Reviewed   Past Medical History:  Diagnosis Date   Aortic sclerosis 2000   on ECHO   Arthritis    knees   Colon polyps    Dr Jarold Motto   Diverticulosis of colon (without mention of hemorrhage)    Esophageal reflux    Eye abnormality    OD ocular freckle   H/O hiatal hernia    Hyperlipidemia 1999   LDL 158, resolved with diet   Premature ventricular contractions    Stricture and stenosis of esophagus    dilation X 3   SVT (supraventricular tachycardia) (HCC)    slow pathway ablated    Past Surgical History:  Procedure Laterality Date   ANKLE SURGERY     right   COLONOSCOPY  10/14/2014   pyrtle   COLONOSCOPY W/ POLYPECTOMY  2011   Dr Jarold Motto   KNEE ARTHROSCOPY     bilateral   ovarian wedge resection     to allow pregnancy    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   SUPRAVENTRICULAR TACHYCARDIA ABLATION N/A 06/08/2013   Procedure: SUPRAVENTRICULAR TACHYCARDIA ABLATION;  Surgeon: Gardiner Rhyme, MD;  Location: [facility];  Service: Cardiovascular;  Laterality: N/A;   TONSILLECTOMY     UPPER GASTROINTESTINAL ENDOSCOPY     [facility] yearly   wisdom teeth ext      Current Meds  Medication Sig   Ascorbic Acid (VITAMIN C) 1000 MG tablet Take 1,000 mg by mouth daily.   Cholecalciferol (VITAMIN D) 50 MCG (2000 UT) CAPS Take by mouth.   pantoprazole (PROTONIX) 40 MG tablet Take 40 mg by mouth daily.   tiZANidine (ZANAFLEX) 2 MG tablet TAKE 1 TABLET BY MOUTH EVERYDAY AT BEDTIME (Patient taking differently: Take 2 mg by mouth as needed.)    Allergies  Allergen Reactions   Dexilant [Dexlansoprazole]     REACTION: pressure in ears   Prevacid [Lansoprazole]     REACTION: pressure in ears   Prilosec [Omeprazole]     REACTION: pressure in ears      Review of Systems negative except from HPI and PMH  Physical Exam BP (!) 152/84   Pulse 69   Ht 5\' 2"  (1.575 m)   Wt 174 lb 9.6 oz (79.2 kg)   LMP 05/27/2006 (  LMP Unknown)   SpO2 97%   BMI 31.93 kg/m  Well developed and well nourished in no acute distress HENT normal E scleral and icterus clear Neck Supple JVP flat; carotids brisk and full Clear to ausculation Regular rate and rhythm, no murmurs gallops or rub Soft with active bowel sounds No clubbing cyanosis  Edema Alert and oriented, grossly normal motor and sensory function Skin Warm and Dry  ECG sinus at 69 17/08/39  CrCl cannot be calculated (Patient's most recent lab result is older than the maximum 21 days allowed.).   Assessment and  Plan dyspnea on exertion   Family history of hypertrophic cardiomyopathy   Mild left ventricular hypertrophy in the absence of hypertension  Hypertension    Dyspnea is largely improved. Saw Dr. Jomarie Longs and recommendation was no further evaluation  of her left ventricular hypertrophy. Blood pressure is elevated.  Will begin her on amlodipine 2.5 mg.  Reviewed side effects.    Current medicines are reviewed at length with the patient today .  The patient does not have concerns regarding medicines.

## 2023-04-23 NOTE — Patient Instructions (Signed)
Medication Instructions:  Your physician has recommended you make the following change in your medication:   ** Begin Amlodipine 2.5mg  - 1 tablet by mouth daily  *If you need a refill on your cardiac medications before your next appointment, please call your pharmacy*   Lab Work: None ordered.  If you have labs (blood work) drawn today and your tests are completely normal, you will receive your results only by: MyChart Message (if you have MyChart) OR A paper copy in the mail If you have any lab test that is abnormal or we need to change your treatment, we will call you to review the results.   Testing/Procedures: None ordered.    Follow-Up: At [facility], you and your health needs are our priority.  As part of our continuing mission to provide you with exceptional heart care, we have created designated Provider Care Teams.  These Care Teams include your primary Cardiologist (physician) and Advanced Practice Providers (APPs -  Physician Assistants and Nurse Practitioners) who all work together to provide you with the care you need, when you need it.  We recommend signing up for the patient portal called "MyChart".  Sign up information is provided on this After Visit Summary.  MyChart is used to connect with patients for Virtual Visits (Telemedicine).  Patients are able to view lab/test results, encounter notes, upcoming appointments, etc.  Non-urgent messages can be sent to your provider as well.   To learn more about what you can do with MyChart, go to [email or web address].    Your next appointment:   12 months with Dr Graciela Husbands

## 2023-04-28 ENCOUNTER — Ambulatory Visit (INDEPENDENT_AMBULATORY_CARE_PROVIDER_SITE_OTHER): Payer: Medicare Other | Admitting: Obstetrics and Gynecology

## 2023-04-28 ENCOUNTER — Encounter: Payer: Self-pay | Admitting: Obstetrics and Gynecology

## 2023-04-28 VITALS — BP 136/84 | HR 77 | Ht 62.0 in | Wt 174.0 lb

## 2023-04-28 DIAGNOSIS — M8589 Other specified disorders of bone density and structure, multiple sites: Secondary | ICD-10-CM

## 2023-04-28 DIAGNOSIS — Z01419 Encounter for gynecological examination (general) (routine) without abnormal findings: Secondary | ICD-10-CM | POA: Diagnosis not present

## 2023-04-28 DIAGNOSIS — N819 Female genital prolapse, unspecified: Secondary | ICD-10-CM

## 2023-04-28 NOTE — Progress Notes (Signed)
77 y.o. y.o. female here for annual exam. Patient's last menstrual period was 05/27/2006 (lmp unknown).  DXA 06/12/22 osteopenia. On calcium and vit D repeat in 2 years 05/29/22 MMG birads 1 neg 12/06/19 colonoscopy. Aged out for repeat   Was given estrogen cream but decided not to take it over fear for cancer.  Has seen urogyn and to do kegels for prolapse and coconut oil Uterus intact. No PMB Traveling to [place] for 57th anniversary soon. Developed HTN after covid.  Started medication for this  Body mass index is 31.83 kg/m.     09/10/2022   10:33 AM 08/22/2021   11:39 AM 08/17/2021    9:42 AM  Depression screen PHQ 2/9  Decreased Interest 0 0 0  Down, Depressed, Hopeless 0 0 0  PHQ - 2 Score 0 0 0    Blood pressure 136/84, pulse 77, height 5\' 2"  (1.575 m), weight 174 lb (78.9 kg), last menstrual period 05/27/2006, SpO2 97%.     Component Value Date/Time   DIAGPAP  04/16/2021 1029    - Negative for intraepithelial lesion or malignancy (NILM)   ADEQPAP  04/16/2021 1029    Satisfactory for evaluation; transformation zone component PRESENT.    GYN HISTORY:    Component Value Date/Time   DIAGPAP  04/16/2021 1029    - Negative for intraepithelial lesion or malignancy (NILM)   ADEQPAP  04/16/2021 1029    Satisfactory for evaluation; transformation zone component PRESENT.    OB History  Gravida Para Term Preterm AB Living  2 2       2   SAB IAB Ectopic Multiple Live Births               # Outcome Date GA Lbr Len/2nd Weight Sex Type Anes PTL Lv  2 Para           1 Para             Obstetric Comments  7lbs 12oz & 6lbs 10oz    Past Medical History:  Diagnosis Date   Aortic sclerosis 2000   on ECHO   Arthritis    knees   Colon polyps    Dr Jarold Motto   Diverticulosis of colon (without mention of hemorrhage)    Esophageal reflux    Eye abnormality    OD ocular freckle   H/O hiatal hernia    Hyperlipidemia 1999   LDL 158, resolved with diet   Premature  ventricular contractions    Stricture and stenosis of esophagus    dilation X 3   SVT (supraventricular tachycardia) (HCC)    slow pathway ablated    Past Surgical History:  Procedure Laterality Date   ANKLE SURGERY     right   COLONOSCOPY  10/14/2014   pyrtle   COLONOSCOPY W/ POLYPECTOMY  2011   Dr Jarold Motto   KNEE ARTHROSCOPY     bilateral   ovarian wedge resection     to allow pregnancy   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   SUPRAVENTRICULAR TACHYCARDIA ABLATION N/A 06/08/2013   Procedure: SUPRAVENTRICULAR TACHYCARDIA ABLATION;  Surgeon: Gardiner Rhyme, MD;  Location: [facility] CATH LAB;  Service: Cardiovascular;  Laterality: N/A;   TONSILLECTOMY     UPPER GASTROINTESTINAL ENDOSCOPY     [facility] yearly   wisdom teeth ext      Current Outpatient Medications on File Prior to Visit  Medication Sig Dispense Refill   amLODipine (NORVASC) 2.5 MG tablet Take 1 tablet (2.5 mg total) by  mouth daily. 90 tablet 3   Ascorbic Acid (VITAMIN C) 1000 MG tablet Take 1,000 mg by mouth daily.     Cholecalciferol (VITAMIN D) 50 MCG (2000 UT) CAPS Take by mouth.     pantoprazole (PROTONIX) 40 MG tablet Take 40 mg by mouth daily.     tiZANidine (ZANAFLEX) 2 MG tablet TAKE 1 TABLET BY MOUTH EVERYDAY AT BEDTIME (Patient taking differently: Take 2 mg by mouth as needed.) 90 tablet 1   estradiol (ESTRACE VAGINAL) 0.1 MG/GM vaginal cream Insert one gram intravaginally qhs for the first 2 weeks then use 3 times a week there after. (Patient not taking: Reported on 04/23/2023) 42.5 g 5   No current facility-administered medications on file prior to visit.    Social History   Socioeconomic History   Marital status: Married    Spouse name: Not on file   Number of children: 2   Years of education: Not on file   Highest education level: Not on file  Occupational History   Occupation: Producer, 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[employer]  Tobacco Use   Smoking status: Never   Smokeless tobacco: Never   Vaping Use   Vaping status: Never Used  Substance and Sexual Activity   Alcohol use: Not Currently   Drug use: No   Sexual activity: Yes    Partners: Male    Birth control/protection: Post-menopausal    Comment: older than 16, less than 5  Other Topics Concern   Not on file  Social History Narrative   Not on file   Social Determinants of Health   Financial Resource Strain: Low Risk  (09/10/2022)   Overall Financial Resource Strain (CARDIA)    Difficulty of Paying Living Expenses: Not hard at all  Food Insecurity: No Food Insecurity (09/10/2022)   Hunger Vital Sign    Worried About Running Out of Food in the Last Year: Never true    Ran Out of Food in the Last Year: Never true  Transportation Needs: No Transportation Needs (09/10/2022)   PRAPARE - Administrator, Civil Service (Medical): No    Lack of Transportation (Non-Medical): No  Physical Activity: Insufficiently Active (09/10/2022)   Exercise Vital Sign    Days of Exercise per Week: 3 days    Minutes of Exercise per Session: 30 min  Stress: No Stress Concern Present (09/10/2022)   [employer] of Occupational Health - Occupational Stress Questionnaire    Feeling of Stress : Not at all  Social Connections: Unknown (09/10/2022)   Social Connection and Isolation Panel [NHANES]    Frequency of Communication with Friends and Family: Once a week    Frequency of Social Gatherings with Friends and Family: Patient declined    Attends Religious Services: 1 to 4 times per year    Active Member of [employer] or Organizations: No    Attends Banker Meetings: Never    Marital Status: Married  Catering manager Violence: Not At Risk (09/10/2022)   Humiliation, Afraid, Rape, and Kick questionnaire    Fear of Current or Ex-Partner: No    Emotionally Abused: No    Physically Abused: No    Sexually Abused: No    Family History  Problem Relation Age of Onset   Stroke Father 62   Arthritis Mother        OA    Hypertension Mother    Hypothyroidism Mother    Stroke Mother 37       ischemic  Heart attack Sister 48       smoker   Ovarian cancer Sister    Osteopenia Sister    Hypertrophic cardiomyopathy Sister    COPD Sister    Heart failure Sister    Hypothyroidism Sister    Cardiomyopathy Sister    Emphysema Sister    Diabetes Maternal Grandmother    Colon cancer Neg Hx    Esophageal cancer Neg Hx    Liver disease Neg Hx    Stomach cancer Neg Hx    Rectal cancer Neg Hx      Allergies  Allergen Reactions   Dexilant [Dexlansoprazole]     REACTION: pressure in ears   Prevacid [Lansoprazole]     REACTION: pressure in ears   Prilosec [Omeprazole]     REACTION: pressure in ears      Patient's last menstrual period was Patient's last menstrual period was 05/27/2006 (lmp unknown)..             Review of Systems Alls systems reviewed and are negative.     Physical Exam Constitutional:      Appearance: Normal appearance.  Genitourinary:     Vulva and urethral meatus normal.     No lesions in the vagina.     Right Labia: No rash, lesions or skin changes.    Left Labia: No lesions, skin changes or rash.    No vaginal discharge or tenderness.     No vaginal prolapse present.    No vaginal atrophy present.     Right Adnexa: not tender, not palpable and no mass present.    Left Adnexa: not tender, not palpable and no mass present.    No cervical motion tenderness or discharge.     Uterus is not enlarged, tender or irregular.  Breasts:    Right: Normal.     Left: Normal.  HENT:     Head: Normocephalic.  Neck:     Thyroid: No thyroid mass, thyromegaly or thyroid tenderness.  Cardiovascular:     Rate and Rhythm: Normal rate and regular rhythm.     Heart sounds: Normal heart sounds, S1 normal and S2 normal.  Pulmonary:     Effort: Pulmonary effort is normal.     Breath sounds: Normal breath sounds and air entry.  Abdominal:     General: There is no distension.      Palpations: Abdomen is soft. There is no mass.     Tenderness: There is no abdominal tenderness. There is no guarding or rebound.  Musculoskeletal:        General: Normal range of motion.     Cervical back: Full passive range of motion without pain, normal range of motion and neck supple. No tenderness.     Right lower leg: No edema.     Left lower leg: No edema.  Neurological:     Mental Status: She is alert.  Skin:    General: Skin is warm.  Psychiatric:        Mood and Affect: Mood normal.        Behavior: Behavior normal.        Thought Content: Thought content normal.  Vitals and nursing note reviewed. Exam conducted with a chaperone present.       A:         Well Woman GYN exam  P:        Pap smear not indicated Encouraged annual mammogram screening Colon cancer screening aged out DXA up-to-date Labs and immunizations to do with PMD Discussed breast self exams Encouraged healthy lifestyle practices Encouraged Vit D and Calcium   No follow-ups on file.  Earley Favor

## 2023-06-12 DIAGNOSIS — S81019A Laceration without foreign body, unspecified knee, initial encounter: Secondary | ICD-10-CM | POA: Diagnosis not present

## 2023-06-12 DIAGNOSIS — S81012A Laceration without foreign body, left knee, initial encounter: Secondary | ICD-10-CM | POA: Diagnosis not present

## 2023-06-19 DIAGNOSIS — S81002A Unspecified open wound, left knee, initial encounter: Secondary | ICD-10-CM | POA: Diagnosis not present

## 2023-07-25 ENCOUNTER — Other Ambulatory Visit: Payer: Self-pay | Admitting: Internal Medicine

## 2023-07-25 DIAGNOSIS — Z1231 Encounter for screening mammogram for malignant neoplasm of breast: Secondary | ICD-10-CM

## 2023-08-15 ENCOUNTER — Ambulatory Visit
Admission: RE | Admit: 2023-08-15 | Discharge: 2023-08-15 | Disposition: A | Discharge status: Home / Self Care | Payer: Medicare Other | Source: Ambulatory Visit | Attending: Internal Medicine | Admitting: Internal Medicine

## 2023-08-15 DIAGNOSIS — Z1231 Encounter for screening mammogram for malignant neoplasm of breast: Secondary | ICD-10-CM | POA: Diagnosis not present

## 2023-08-20 ENCOUNTER — Other Ambulatory Visit: Payer: Self-pay | Admitting: Internal Medicine

## 2023-08-20 DIAGNOSIS — R928 Other abnormal and inconclusive findings on diagnostic imaging of breast: Secondary | ICD-10-CM

## 2023-08-22 ENCOUNTER — Ambulatory Visit
Admission: RE | Admit: 2023-08-22 | Discharge: 2023-08-22 | Disposition: A | Discharge status: Home / Self Care | Source: Ambulatory Visit | Attending: Internal Medicine | Admitting: Internal Medicine

## 2023-08-22 ENCOUNTER — Other Ambulatory Visit: Payer: Self-pay | Admitting: Internal Medicine

## 2023-08-22 DIAGNOSIS — R921 Mammographic calcification found on diagnostic imaging of breast: Secondary | ICD-10-CM | POA: Diagnosis not present

## 2023-08-22 DIAGNOSIS — R928 Other abnormal and inconclusive findings on diagnostic imaging of breast: Secondary | ICD-10-CM

## 2023-09-10 ENCOUNTER — Encounter

## 2023-09-11 ENCOUNTER — Ambulatory Visit (INDEPENDENT_AMBULATORY_CARE_PROVIDER_SITE_OTHER): Payer: Medicare Other

## 2023-09-11 VITALS — Ht 62.0 in | Wt 174.0 lb

## 2023-09-11 DIAGNOSIS — Z0000 Encounter for general adult medical examination without abnormal findings: Secondary | ICD-10-CM | POA: Diagnosis not present

## 2023-09-11 NOTE — Patient Instructions (Addendum)
 Toni [name] , Thank you for taking time to come for your Medicare Wellness Visit. I appreciate your ongoing commitment to your health goals. Please review the following plan we discussed and let me know if I can assist you in the future.   Referrals/Orders/Follow-Ups/Clinician Recommendations: It was nice to speaking to you today.  Keep up the good work and Happy Easter.  This is a list of the screening recommended for you and due dates:  Health Maintenance  Topic Date Due   Zoster (Shingles) Vaccine (1 of 2) 07/12/1997   DTaP/Tdap/Td vaccine (4 - Td or Tdap) 02/25/2021   COVID-19 Vaccine (3 - 2024-25 season) 01/26/2023   Flu Shot  12/26/2023   DEXA scan (bone density measurement)  06/12/2024   Medicare Annual Wellness Visit  09/10/2024   Colon Cancer Screening  12/05/2024   Pneumonia Vaccine  Completed   Hepatitis C Screening  Completed   HPV Vaccine  Aged Out   Meningitis B Vaccine  Aged Out    Advanced directives: (Copy Requested) Please bring a copy of your health care power of attorney and living will to the office to be added to your chart at your convenience. You can mail to [facility] [address] [place], [place] [address] or email to [email or web address]  Next Medicare Annual Wellness Visit scheduled for next year: Yes

## 2023-09-11 NOTE — Progress Notes (Signed)
 Subjective:   Toni [name] is a 78 y.o. who presents for a Medicare Wellness preventive visit.  Visit Complete: Virtual I connected with  Toni [name] on 09/11/23 by a audio enabled telemedicine application and verified that I am speaking with the correct person using two identifiers.  Patient Location: Home  Provider Location: Home Office  I discussed the limitations of evaluation and management by telemedicine. The patient expressed understanding and agreed to proceed.  Vital Signs: Because this visit was a virtual/telehealth visit, some criteria may be missing or patient reported. Any vitals not documented were not able to be obtained and vitals that have been documented are patient reported.  VideoDeclined- This patient declined Librarian, academic. Therefore the visit was completed with audio only.  Persons Participating in Visit: Patient.  AWV Questionnaire: Yes: Patient Medicare AWV questionnaire was completed by the patient on 09/10/2023; I have confirmed that all information answered by patient is correct and no changes since this date.  Cardiac Risk Factors include: advanced age (>54men, >45 women);dyslipidemia;Other (see comment), Risk factor comments: SVT,     Objective:    Today's Vitals   09/11/23 1014  Weight: 174 lb (78.9 kg)  Height: 5\' 2"  (1.575 m)   Body mass index is 31.83 kg/m.     09/11/2023   10:19 AM 09/10/2022   10:40 AM 08/22/2021   11:34 AM 08/10/2020    2:07 PM 12/23/2018   11:18 AM 11/04/2017    9:30 AM 06/14/2016    9:40 AM  Advanced Directives  Does Patient Have a Medical Advance Directive? Yes Yes Yes Yes Yes Yes No  Type of Estate agent of [place];Living will Healthcare Power of [place];Living will Living will;Healthcare Power of [employer] Power of [place];Living will Healthcare Power of [place];Living will   Does patient want to make changes to medical advance  directive?  No - Patient declined No - Patient declined No - Patient declined     Copy of Healthcare Power of Attorney in Chart? No - copy requested No - copy requested No - copy requested  No - copy requested No - copy requested     Current Medications (verified) Outpatient Encounter Medications as of 09/11/2023  Medication Sig   amLODipine (NORVASC) 2.5 MG tablet Take 1 tablet (2.5 mg total) by mouth daily.   Ascorbic Acid (VITAMIN C) 1000 MG tablet Take 1,000 mg by mouth daily.   Cholecalciferol (VITAMIN D) 50 MCG (2000 UT) CAPS Take by mouth.   pantoprazole (PROTONIX) 40 MG tablet Take 40 mg by mouth daily.   tiZANidine (ZANAFLEX) 2 MG tablet TAKE 1 TABLET BY MOUTH EVERYDAY AT BEDTIME (Patient taking differently: Take 2 mg by mouth as needed.)   No facility-administered encounter medications on file as of 09/11/2023.    Allergies (verified) Dexilant [dexlansoprazole], Prevacid [lansoprazole], and Prilosec [omeprazole]   History: Past Medical History:  Diagnosis Date   Aortic sclerosis 2000   on ECHO   Arthritis    knees   Colon polyps    Dr Jarold Motto   Diverticulosis of colon (without mention of hemorrhage)    Esophageal reflux    Eye abnormality    OD ocular freckle   H/O hiatal hernia    Hyperlipidemia 1999   LDL 158, resolved with diet   Premature ventricular contractions    Stricture and stenosis of esophagus    dilation X 3   SVT (supraventricular tachycardia) (HCC)    slow pathway ablated  Past Surgical History:  Procedure Laterality Date   ANKLE SURGERY     right   COLONOSCOPY  10/14/2014   pyrtle   COLONOSCOPY W/ POLYPECTOMY  2011   Dr Jarold Motto   KNEE ARTHROSCOPY     bilateral   ovarian wedge resection     to allow pregnancy   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   SUPRAVENTRICULAR TACHYCARDIA ABLATION N/A 06/08/2013   Procedure: SUPRAVENTRICULAR TACHYCARDIA ABLATION;  Surgeon: Gardiner Rhyme, MD;  Location: [facility] CATH LAB;  Service: Cardiovascular;   Laterality: N/A;   TONSILLECTOMY     UPPER GASTROINTESTINAL ENDOSCOPY     [facility] yearly   wisdom teeth ext     Family History  Problem Relation Age of Onset   Stroke Father 37   Arthritis Mother        OA   Hypertension Mother    Hypothyroidism Mother    Stroke Mother 3       ischemic   Heart attack Sister 56       smoker   Ovarian cancer Sister    Osteopenia Sister    Hypertrophic cardiomyopathy Sister    COPD Sister    Heart failure Sister    Hypothyroidism Sister    Cardiomyopathy Sister    Emphysema Sister    Diabetes Maternal Grandmother    Colon cancer Neg Hx    Esophageal cancer Neg Hx    Liver disease Neg Hx    Stomach cancer Neg Hx    Rectal cancer Neg Hx    Social History   Socioeconomic History   Marital status: Married    Spouse name: Toni [name]   Number of children: 2   Years of education: Not on file   Highest education level: Some college, no degree  Occupational History   Occupation: Producer, 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  Tobacco Use   Smoking status: Never   Smokeless tobacco: Never  Vaping Use   Vaping status: Never Used  Substance and Sexual Activity   Alcohol use: Not Currently   Drug use: No   Sexual activity: Yes    Partners: Male    Birth control/protection: Post-menopausal    Comment: older than 16, less than 5  Other Topics Concern   Not on file  Social History Narrative   Lives with husband   Social Drivers of Health   Financial Resource Strain: Low Risk  (09/10/2023)   Overall Financial Resource Strain (CARDIA)    Difficulty of Paying Living Expenses: Not hard at all  Food Insecurity: No Food Insecurity (09/10/2023)   Hunger Vital Sign    Worried About Running Out of Food in the Last Year: Never true    Ran Out of Food in the Last Year: Never true  Transportation Needs: No Transportation Needs (09/10/2023)   PRAPARE - Administrator, Civil Service (Medical): No    Lack of  Transportation (Non-Medical): No  Physical Activity: Insufficiently Active (09/10/2023)   Exercise Vital Sign    Days of Exercise per Week: 3 days    Minutes of Exercise per Session: 20 min  Stress: No Stress Concern Present (09/10/2023)   [employer] of Occupational Health - Occupational Stress Questionnaire    Feeling of Stress : Not at all  Social Connections: Unknown (09/10/2023)   Social Connection and Isolation Panel [NHANES]    Frequency of Communication with Friends and Family: Once a week    Frequency of Social Gatherings  with Friends and Family: Patient declined    Attends Religious Services: More than 4 times per year    Active Member of Clubs or Organizations: No    Attends Engineer, structural: Not on file    Marital Status: Married    Tobacco Counseling Counseling given: Not Answered    Clinical Intake:  Pre-visit preparation completed: Yes  Pain : No/denies pain     BMI - recorded: 31.83 Nutritional Status: BMI > 30  Obese Nutritional Risks: None Diabetes: No  Lab Results  Component Value Date   HGBA1C 5.3 08/17/2021   HGBA1C 5.1 09/13/2020   HGBA1C 5.4 12/05/2017     How often do you need to have someone help you when you read instructions, pamphlets, or other written materials from your doctor or pharmacy?: 1 - Never  Interpreter Needed?: No  Information entered by :: Tanairi Cypert, RMA   Activities of Daily Living     09/10/2023   10:48 AM  In your present state of health, do you have any difficulty performing the following activities:  Hearing? 0  Vision? 0  Difficulty concentrating or making decisions? 0  Walking or climbing stairs? 0  Dressing or bathing? 0  Doing errands, shopping? 0  Preparing Food and eating ? N  Using the Toilet? N  In the past six months, have you accidently leaked urine? N  Do you have problems with loss of bowel control? N  Managing your Medications? N  Managing your Finances? N  Housekeeping or  managing your Housekeeping? N    Patient Care Team: Colene Dauphin, MD as PCP - General (Internal Medicine) Verona Goodwill, MD as PCP - Electrophysiology (Cardiology) Garfield Jungling as Consulting Physician (Optometry) Pyrtle, Amber Bail, MD as Consulting Physician (Gastroenterology) Garold Kail Arna Better, MD as Consulting Physician (Gastroenterology) Percy Bracken, MD as Consulting Physician (Obstetrics and Gynecology) Isidro Margo, DO as Consulting Physician (Family Medicine)  Indicate any recent Medical Services you may have received from other than [facility] providers in the past year (date may be approximate).     Assessment:   This is a routine wellness examination for [place].  Hearing/Vision screen Hearing Screening - Comments:: Denies hearing difficulties   Vision Screening - Comments:: Wears eyeglasses   Goals Addressed             This Visit's Progress    Patient Stated   On track    I want to lose weight by eating healthy and exercise. I want to enjoy life, family and travel.       Depression Screen     09/11/2023   10:22 AM 09/10/2022   10:33 AM 08/22/2021   11:39 AM 08/17/2021    9:42 AM 08/17/2021    9:39 AM 08/10/2020    2:48 PM 12/23/2018   11:41 AM  PHQ 2/9 Scores  PHQ - 2 Score 0 0 0 0 0 0 0  PHQ- 9 Score 0          Fall Risk     09/10/2023   10:48 AM 01/24/2023    8:08 AM 09/10/2022   10:19 AM 09/07/2022    1:19 PM 06/17/2022    2:26 PM  Fall Risk   Falls in the past year? 1 0 0 0 0  Number falls in past yr: 0 0 0 0 0  Injury with Fall? 1 0 0 0 0  Risk for fall due to :  No Fall Risks No Fall  Risks  No Fall Risks  Follow up Falls evaluation completed;Falls prevention discussed Falls evaluation completed Falls prevention discussed;Education provided;Falls evaluation completed  Falls evaluation completed    MEDICARE RISK AT HOME:  Medicare Risk at Home Any stairs in or around the home?: (Patient-Rptd) Yes If so, are there any without handrails?:  (Patient-Rptd) No Home free of loose throw rugs in walkways, pet beds, electrical cords, etc?: (Patient-Rptd) Yes Adequate lighting in your home to reduce risk of falls?: (Patient-Rptd) Yes Life alert?: (Patient-Rptd) No Use of a cane, walker or w/c?: (Patient-Rptd) No Grab bars in the bathroom?: (Patient-Rptd) No Shower chair or bench in shower?: (Patient-Rptd) No Elevated toilet seat or a handicapped toilet?: (Patient-Rptd) No  TIMED UP AND GO:  Was the test performed?  No  Cognitive Function: 6CIT completed        09/11/2023   10:20 AM 09/10/2022   10:40 AM  6CIT Screen  What Year? 0 points 0 points  What month? 0 points 0 points  What time? 0 points 0 points  Count back from 20 0 points 0 points  Months in reverse 0 points 0 points  Repeat phrase 2 points 0 points  Total Score 2 points 0 points    Immunizations Immunization History  Administered Date(s) Administered   Fluad Quad(high Dose 65+) 01/31/2022, 01/14/2023   Influenza, High Dose Seasonal PF 02/21/2017, 02/06/2018   Influenza,inj,Quad PF,6+ Mos 02/24/2013, 02/07/2015   Influenza-Unspecified 02/10/2014, 01/11/2019   PFIZER(Purple Top)SARS-COV-2 Vaccination 06/21/2019, 07/12/2019   Pneumococcal Conjugate-13 02/16/2014   Pneumococcal Polysaccharide-23 06/08/2015   Td 05/27/2001   Tdap 06/27/2010, 02/26/2011, 06/13/2023   Zoster, Live 04/12/2010    Screening Tests Health Maintenance  Topic Date Due   Zoster Vaccines- Shingrix (1 of 2) 07/12/1997   COVID-19 Vaccine (3 - 2024-25 season) 01/26/2023   INFLUENZA VACCINE  12/26/2023   DEXA SCAN  06/12/2024   Medicare Annual Wellness (AWV)  09/10/2024   Colonoscopy  12/05/2024   DTaP/Tdap/Td (5 - Td or Tdap) 06/12/2033   Pneumonia Vaccine 73+ Years old  Completed   Hepatitis C Screening  Completed   HPV VACCINES  Aged Out   Meningococcal B Vaccine  Aged Out    Health Maintenance  Health Maintenance Due  Topic Date Due   Zoster Vaccines- Shingrix (1 of  2) 07/12/1997   COVID-19 Vaccine (3 - 2024-25 season) 01/26/2023   Health Maintenance Items Addressed: See Nurse Notes  Additional Screening:  Vision Screening: Recommended annual ophthalmology exams for early detection of glaucoma and other disorders of the eye.  Dental Screening: Recommended annual dental exams for proper oral hygiene  Community Resource Referral / Chronic Care Management: CRR required this visit?  No   CCM required this visit?  No     Plan:     I have personally reviewed and noted the following in the patient's chart:   Medical and social history Use of alcohol, tobacco or illicit drugs  Current medications and supplements including opioid prescriptions. Patient is not currently taking opioid prescriptions. Functional ability and status Nutritional status Physical activity Advanced directives List of other physicians Hospitalizations, surgeries, and ER visits in previous 12 months Vitals Screenings to include cognitive, depression, and falls Referrals and appointments  In addition, I have reviewed and discussed with patient certain preventive protocols, quality metrics, and best practice recommendations. A written personalized care plan for preventive services as well as general preventive health recommendations were provided to patient.     Lakyla Biswas L Makylah Bossard, CMA  09/11/2023   After Visit Summary: (MyChart) Due to this being a telephonic visit, the after visit summary with patients personalized plan was offered to patient via MyChart   Notes: Please refer to Routing Comments.

## 2023-09-12 ENCOUNTER — Ambulatory Visit
Admission: RE | Admit: 2023-09-12 | Discharge: 2023-09-12 | Disposition: A | Discharge status: Home / Self Care | Source: Ambulatory Visit | Attending: Internal Medicine | Admitting: Internal Medicine

## 2023-09-12 ENCOUNTER — Ambulatory Visit
Admission: RE | Admit: 2023-09-12 | Discharge: 2023-09-12 | Disposition: A | Discharge status: Home / Self Care | Source: Ambulatory Visit | Attending: Internal Medicine

## 2023-09-12 DIAGNOSIS — D0511 Intraductal carcinoma in situ of right breast: Secondary | ICD-10-CM | POA: Diagnosis not present

## 2023-09-12 DIAGNOSIS — R921 Mammographic calcification found on diagnostic imaging of breast: Secondary | ICD-10-CM

## 2023-09-12 DIAGNOSIS — R92321 Mammographic fibroglandular density, right breast: Secondary | ICD-10-CM | POA: Diagnosis not present

## 2023-09-12 DIAGNOSIS — R920 Mammographic microcalcification found on diagnostic imaging of breast: Secondary | ICD-10-CM | POA: Diagnosis not present

## 2023-09-12 HISTORY — PX: BREAST BIOPSY: SHX20

## 2023-09-15 ENCOUNTER — Telehealth: Payer: Self-pay | Admitting: *Deleted

## 2023-09-15 ENCOUNTER — Telehealth: Payer: Self-pay | Admitting: Internal Medicine

## 2023-09-15 DIAGNOSIS — M25562 Pain in left knee: Secondary | ICD-10-CM | POA: Diagnosis not present

## 2023-09-15 LAB — SURGICAL PATHOLOGY

## 2023-09-15 NOTE — Telephone Encounter (Signed)
 Copied from CRM [id number]. Topic: Clinical - Lab/Test Results >> Sep 15, 2023 12:59 PM Kita Perish H wrote: Reason for CRM: Patient would like to speak with Dr. Donnette Gal nurse has questions regarding her lab results, please reach out thanks.  Haskell Linker [phone]

## 2023-09-15 NOTE — Telephone Encounter (Signed)
 Spoke to patient to confirm upcoming afternoon [facility] clinic appointment on 4/30, paperwork will be sent via mail.  Gave location and time, also informed patient that the surgeon's office would be calling as well to get information from them similar to the packet that they will be receiving so make sure to do both.  Reminded patient that all providers will be coming to the clinic to see them HERE and if they had any questions to not hesitate to reach back out to myself or their navigators.

## 2023-09-16 NOTE — Telephone Encounter (Signed)
 I would recommend Dr Delane Fear

## 2023-09-17 NOTE — Telephone Encounter (Signed)
 Called and LM for patient with info.

## 2023-09-22 ENCOUNTER — Encounter: Payer: Self-pay | Admitting: *Deleted

## 2023-09-22 DIAGNOSIS — D0511 Intraductal carcinoma in situ of right breast: Secondary | ICD-10-CM | POA: Insufficient documentation

## 2023-09-24 ENCOUNTER — Inpatient Hospital Stay (HOSPITAL_BASED_OUTPATIENT_CLINIC_OR_DEPARTMENT_OTHER): Attending: Hematology and Oncology | Admitting: Hematology and Oncology

## 2023-09-24 ENCOUNTER — Encounter: Payer: Self-pay | Admitting: General Practice

## 2023-09-24 ENCOUNTER — Ambulatory Visit: Payer: Self-pay | Admitting: General Surgery

## 2023-09-24 ENCOUNTER — Encounter: Payer: Self-pay | Admitting: Genetic Counselor

## 2023-09-24 ENCOUNTER — Inpatient Hospital Stay: Admitting: Genetic Counselor

## 2023-09-24 ENCOUNTER — Inpatient Hospital Stay

## 2023-09-24 ENCOUNTER — Ambulatory Visit
Admission: RE | Admit: 2023-09-24 | Discharge: 2023-09-24 | Disposition: A | Discharge status: Home / Self Care | Source: Ambulatory Visit | Attending: Radiation Oncology | Admitting: Radiation Oncology

## 2023-09-24 ENCOUNTER — Ambulatory Visit: Admitting: Physical Therapy

## 2023-09-24 ENCOUNTER — Encounter: Payer: Self-pay | Admitting: Hematology and Oncology

## 2023-09-24 VITALS — BP 166/68 | HR 86 | Temp 98.3°F | Resp 16 | Ht 62.01 in | Wt 175.7 lb

## 2023-09-24 DIAGNOSIS — D0511 Intraductal carcinoma in situ of right breast: Secondary | ICD-10-CM | POA: Insufficient documentation

## 2023-09-24 DIAGNOSIS — I10 Essential (primary) hypertension: Secondary | ICD-10-CM | POA: Diagnosis not present

## 2023-09-24 DIAGNOSIS — M129 Arthropathy, unspecified: Secondary | ICD-10-CM | POA: Diagnosis not present

## 2023-09-24 DIAGNOSIS — E785 Hyperlipidemia, unspecified: Secondary | ICD-10-CM | POA: Diagnosis not present

## 2023-09-24 DIAGNOSIS — Z860101 Personal history of adenomatous and serrated colon polyps: Secondary | ICD-10-CM | POA: Insufficient documentation

## 2023-09-24 DIAGNOSIS — Z8041 Family history of malignant neoplasm of ovary: Secondary | ICD-10-CM | POA: Diagnosis not present

## 2023-09-24 DIAGNOSIS — I471 Supraventricular tachycardia, unspecified: Secondary | ICD-10-CM | POA: Insufficient documentation

## 2023-09-24 DIAGNOSIS — Z170 Estrogen receptor positive status [ER+]: Secondary | ICD-10-CM | POA: Diagnosis not present

## 2023-09-24 DIAGNOSIS — Z171 Estrogen receptor negative status [ER-]: Secondary | ICD-10-CM | POA: Diagnosis not present

## 2023-09-24 DIAGNOSIS — Z79899 Other long term (current) drug therapy: Secondary | ICD-10-CM | POA: Insufficient documentation

## 2023-09-24 LAB — CMP (CANCER CENTER ONLY)
ALT: 15 U/L (ref 0–44)
AST: 11 U/L — ABNORMAL LOW (ref 15–41)
Albumin: 4 g/dL (ref 3.5–5.0)
Alkaline Phosphatase: 79 U/L (ref 38–126)
Anion gap: 6 (ref 5–15)
BUN: 17 mg/dL (ref 8–23)
CO2: 26 mmol/L (ref 22–32)
Calcium: 8.8 mg/dL — ABNORMAL LOW (ref 8.9–10.3)
Chloride: 109 mmol/L (ref 98–111)
Creatinine: 0.72 mg/dL (ref 0.44–1.00)
GFR, Estimated: >60 mL/min (ref >60)
Glucose, Bld: 119 mg/dL — ABNORMAL HIGH (ref 70–99)
Potassium: 3.8 mmol/L (ref 3.5–5.1)
Sodium: 141 mmol/L (ref 135–145)
Total Bilirubin: 0.5 mg/dL (ref 0.0–1.2)
Total Protein: 6.6 g/dL (ref 6.5–8.1)

## 2023-09-24 LAB — CBC WITH DIFFERENTIAL (CANCER CENTER ONLY)
Abs Immature Granulocytes: 0.06 10*3/uL (ref 0.00–0.07)
Basophils Absolute: 0 10*3/uL (ref 0.0–0.1)
Basophils Relative: 0 %
Eosinophils Absolute: 0.1 10*3/uL (ref 0.0–0.5)
Eosinophils Relative: 1 %
HCT: 39.6 % (ref 36.0–46.0)
Hemoglobin: 13.5 g/dL (ref 12.0–15.0)
Immature Granulocytes: 1 %
Lymphocytes Relative: 21 %
Lymphs Abs: 1.8 10*3/uL (ref 0.7–4.0)
MCH: 28.7 pg (ref 26.0–34.0)
MCHC: 34.1 g/dL (ref 30.0–36.0)
MCV: 84.3 fL (ref 80.0–100.0)
Monocytes Absolute: 0.5 10*3/uL (ref 0.1–1.0)
Monocytes Relative: 6 %
Neutro Abs: 6 10*3/uL (ref 1.7–7.7)
Neutrophils Relative %: 71 %
Platelet Count: 257 10*3/uL (ref 150–400)
RBC: 4.7 MIL/uL (ref 3.87–5.11)
RDW: 13.1 % (ref 11.5–15.5)
WBC Count: 8.6 10*3/uL (ref 4.0–10.5)
nRBC: 0 % (ref 0.0–0.2)

## 2023-09-24 LAB — GENETIC SCREENING ORDER

## 2023-09-24 MED ORDER — KETOROLAC TROMETHAMINE 15 MG/ML IJ SOLN: 15.0000 mg | INTRAMUSCULAR | Status: AC

## 2023-09-24 NOTE — Progress Notes (Signed)
 [facility] Spiritual Care Note  Met with Yaret and her husband in Breast Multidisciplinary Clinic to introduce Support Center team/resources.  She completed SDOH screening; results follow below.  SDOH Interventions    Flowsheet Row Clinical Support from 09/11/2023 in [facility] at [facility] Clinical Support from 09/10/2022 in [facility] at [facility] Clinical Support from 08/22/2021 in [facility] at [place]  SDOH Interventions     Food Insecurity Interventions Intervention Not Indicated Intervention Not Indicated Intervention Not Indicated  Housing Interventions Intervention Not Indicated Intervention Not Indicated Intervention Not Indicated  Transportation Interventions Intervention Not Indicated Intervention Not Indicated Intervention Not Indicated  Utilities Interventions Intervention Not Indicated Intervention Not Indicated --  Alcohol Usage Interventions Intervention Not Indicated (Score <7) Intervention Not Indicated (Score <7) --  Depression Interventions/Treatment  PHQ2-9 Score <4 Follow-up Not Indicated -- --  Financial Strain Interventions Intervention Not Indicated Intervention Not Indicated Intervention Not Indicated  Physical Activity Interventions Intervention Not Indicated Intervention Not Indicated Intervention Not Indicated  Stress Interventions Intervention Not Indicated Intervention Not Indicated Intervention Not Indicated  Social Connections Interventions Intervention Not Indicated Intervention Not Indicated Intervention Not Indicated  Health Literacy Interventions Intervention Not Indicated -- --       SDOH Screenings   Food Insecurity: No Food Insecurity (09/24/2023)  Housing: Low Risk  (09/24/2023)  Transportation Needs: No Transportation Needs (09/10/2023)  Utilities: Not At Risk (09/24/2023)  Alcohol Screen: Low Risk  (09/10/2023)  Depression (PHQ2-9): Low Risk  (09/11/2023)   Financial Resource Strain: Low Risk  (09/10/2023)  Physical Activity: Insufficiently Active (09/10/2023)  Social Connections: Unknown (09/10/2023)  Stress: No Stress Concern Present (09/10/2023)  Tobacco Use: Low Risk  (09/24/2023)  Health Literacy: Adequate Health Literacy (09/11/2023)    Chaplain and patient discussed common feelings and emotions when being diagnosed with cancer, and the importance of support during treatment.  Chaplain informed patient of the support team and support services at [facility].  Chaplain provided contact information and encouraged patient to call with any questions or concerns.  Ms Hazelbaker reports relief at learning the scope of her diagnosis/treatment and appreciation of her new [facility] team. 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 She and her husband travel/enjoy cruises regularly.  Follow up needed: No. Ms Kohan is aware of ongoing chaplain availability and has direct Spiritual Care number in case needs arise or circumstances change.   [address] [place]ice [place], [place], [facility] Pager [phone] Voicemail [phone]

## 2023-09-24 NOTE — Assessment & Plan Note (Signed)
 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 It is considered a precancerous lesion. DCIS is classified as a 0. It is generally detected through mammograms as calcifications. We discussed the significance of grades and its impact on prognosis. We also discussed the importance of ER and PR receptors and their implications to adjuvant treatment options. Prognosis of DCIS dependence on grade, comedo necrosis. It is anticipated that if not treated, 20-30% of DCIS can develop into invasive breast cancer.  Recommendation: 1. Breast conserving surgery 2. Followed by adjuvant radiation therapy   Return to clinic after surgery to discuss the final pathology report and come up with an adjuvant treatment plan.

## 2023-09-24 NOTE — Progress Notes (Signed)
 [facility] CONSULT NOTE  Patient Care Team: Colene Dauphin, MD as PCP - General (Internal Medicine) Verona Goodwill, MD as PCP - Electrophysiology (Cardiology) Garfield Jungling as Consulting Physician (Optometry) Pyrtle, Amber Bail, MD as Consulting Physician (Gastroenterology) Garold Kail Arna Better, MD as Consulting Physician (Gastroenterology) Percy Bracken, MD as Consulting Physician (Obstetrics and Gynecology) Isidro Margo, DO as Consulting Physician (Family Medicine) Alane Hsu, RN as Oncology Nurse Navigator Auther Bo, RN as Oncology Nurse Navigator Caralyn Chandler, MD as Consulting Physician (General Surgery) Cameron Cea, MD as Consulting Physician (Hematology and Oncology) Johna Myers, MD as Consulting Physician (Radiation Oncology)  CHIEF COMPLAINTS/PURPOSE OF CONSULTATION:  Newly diagnosed breast cancer  HISTORY OF PRESENTING ILLNESS:    History of Present Illness Toni [name] is a 78 year old female with ductal carcinoma in situ (DCIS) who presents for a follow-up consultation regarding her diagnosis and treatment options.  A routine mammogram detected a small disturbance in the breast, identified as ductal carcinoma in situ (DCIS), measuring approximately seven millimeters.  She has a history of precancerous cells in her esophagus due to GERD, treated with a procedure in [place] ten years ago. Follow-up appointments have shown no recurrence.  She maintains regular health screenings and has not taken hormone replacement therapy during menopause.     I reviewed her records extensively and collaborated the history with the patient.  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%, PR 0%   09/24/2023 Cancer Staging    Staging form: Breast, AJCC 8th Edition - Clinical: Stage 0 (cTis (DCIS), cN0, cM0, G3, ER-, PR-, HER2: Not Assessed) - Signed by Cameron Cea, MD on 09/24/2023 Stage prefix: Initial diagnosis Histologic grading system: 3 grade system      MEDICAL HISTORY:  Past Medical History:  Diagnosis Date   Aortic sclerosis 2000   on ECHO   Arthritis    knees   Breast cancer (HCC)    Colon polyps    Dr Adan Holms   Diverticulosis of colon (without mention of hemorrhage)    Esophageal reflux    Eye abnormality    OD ocular freckle   H/O hiatal hernia    Hyperlipidemia 1999   LDL 158, resolved with diet   Hypertension    Premature ventricular contractions    Stricture and stenosis of esophagus    dilation X 3   SVT (supraventricular tachycardia) (HCC)    slow pathway ablated    SURGICAL HISTORY: Past Surgical History:  Procedure Laterality Date   ANKLE SURGERY     right   BREAST BIOPSY Right 09/12/2023   MM RT BREAST BX W LOC DEV 1ST LESION IMAGE BX SPEC STEREO GUIDE 09/12/2023 [facility]MO[facility]   COLONOSCOPY  10/14/2014   pyrtle   COLONOSCOPY W/ POLYPECTOMY  2011   Dr Adan Holms   KNEE ARTHROSCOPY     bilateral   ovarian wedge resection     to allow pregnancy   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   SUPRAVENTRICULAR TACHYCARDIA ABLATION N/A 06/08/2013   Procedure: SUPRAVENTRICULAR TACHYCARDIA ABLATION;  Surgeon: Ellaree Gunther, MD;  Location: [facility];  Service: Cardiovascular;  Laterality: N/A;   TONSILLECTOMY     UPPER GASTROINTESTINAL ENDOSCOPY     [facility] yearly   wisdom teeth ext  SOCIAL HISTORY: Social History   Socioeconomic History   Marital status: Married    Spouse name: Micheal   Number of children: 2   Years of education: Not on file   Highest education level: Some college, no degree  Occupational History   Occupation: Producer, 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  Tobacco Use   Smoking status: Never   Smokeless  tobacco: Never  Vaping Use   Vaping status: Never Used  Substance and Sexual Activity   Alcohol use: Not Currently   Drug use: No   Sexual activity: Yes    Partners: Male    Birth control/protection: Post-menopausal    Comment: older than 16, less than 5  Other Topics Concern   Not on file  Social History Narrative   Lives with husband   Social Drivers of Health   Financial Resource Strain: Low Risk  (09/10/2023)   Overall Financial Resource Strain (CARDIA)    Difficulty of Paying Living Expenses: Not hard at all  Food Insecurity: No Food Insecurity (09/10/2023)   Hunger Vital Sign    Worried About Running Out of Food in the Last Year: Never true    Ran Out of Food in the Last Year: Never true  Transportation Needs: No Transportation Needs (09/10/2023)   PRAPARE - Administrator, Civil Service (Medical): No    Lack of Transportation (Non-Medical): No  Physical Activity: Insufficiently Active (09/10/2023)   Exercise Vital Sign    Days of Exercise per Week: 3 days    Minutes of Exercise per Session: 20 min  Stress: No Stress Concern Present (09/10/2023)   [employer] of Occupational Health - Occupational Stress Questionnaire    Feeling of Stress : Not at all  Social Connections: Unknown (09/10/2023)   Social Connection and Isolation Panel [NHANES]    Frequency of Communication with Friends and Family: Once a week    Frequency of Social Gatherings with Friends and Family: Patient declined    Attends Religious Services: More than 4 times per year    Active Member of [employer] or Organizations: No    Attends Engineer, structural: Not on file    Marital Status: Married  Catering manager Violence: Not At Risk (09/11/2023)   Humiliation, Afraid, Rape, and Kick questionnaire    Fear of Current or Ex-Partner: No    Emotionally Abused: No    Physically Abused: No    Sexually Abused: No    FAMILY HISTORY: Family History  Problem Relation Age of Onset    Arthritis Mother        OA   Hypertension Mother    Hypothyroidism Mother    Stroke Mother 7       ischemic   Stroke Father 40   Heart attack Sister 5       smoker   Ovarian cancer Sister        dx 67s   Osteopenia Sister    Hypertrophic cardiomyopathy Sister    COPD Sister    Heart failure Sister    Hypothyroidism Sister    Cardiomyopathy Sister    Emphysema Sister    Diabetes Maternal Grandmother    Colon cancer Neg Hx    Esophageal cancer Neg Hx    Liver disease Neg Hx    Stomach cancer Neg Hx    Rectal cancer Neg Hx     ALLERGIES:  is allergic to dexilant [dexlansoprazole], prevacid [lansoprazole], and prilosec [omeprazole].  MEDICATIONS:  Current Outpatient Medications  Medication Sig Dispense Refill   amLODipine  (NORVASC ) 2.5 MG tablet Take 1 tablet (2.5 mg total) by mouth daily. 90 tablet 3   Ascorbic Acid (VITAMIN C) 1000 MG tablet Take 1,000 mg by mouth daily.     Cholecalciferol (VITAMIN D) 50 MCG (2000 UT) CAPS Take by mouth.     pantoprazole  (PROTONIX ) 40 MG tablet Take 40 mg by mouth daily.     tiZANidine  (ZANAFLEX ) 2 MG tablet TAKE 1 TABLET BY MOUTH EVERYDAY AT BEDTIME (Patient taking differently: Take 2 mg by mouth as needed.) 90 tablet 1   No current facility-administered medications for this visit.   Facility-Administered Medications Ordered in Other Visits  Medication Dose Route Frequency Provider Last Rate Last Admin   [START ON 09/25/2023] ketorolac (TORADOL) 15 MG/ML injection 15 mg  15 mg Intravenous On Call to OR Lillette Reid III, MD        REVIEW OF SYSTEMS:   Constitutional: Denies fevers, chills or abnormal night sweats Breast:  Denies any palpable lumps or discharge All other systems were reviewed with the patient and are negative.  PHYSICAL EXAMINATION: ECOG PERFORMANCE STATUS: 1 - Symptomatic but completely ambulatory  Vitals:   09/24/23 1254  BP: (!) 166/68  Pulse: 86  Resp: 16  Temp: 98.3 F (36.8 C)  SpO2: 97%   Filed Weights    09/24/23 1254  Weight: 175 lb 11.2 oz (79.7 kg)    GENERAL:alert, no distress and comfortable    LABORATORY DATA:  I have reviewed the data as listed Lab Results  Component Value Date   WBC 8.6 09/24/2023   HGB 13.5 09/24/2023   HCT 39.6 09/24/2023   MCV 84.3 09/24/2023   PLT 257 09/24/2023   Lab Results  Component Value Date   NA 141 09/24/2023   K 3.8 09/24/2023   CL 109 09/24/2023   CO2 26 09/24/2023    RADIOGRAPHIC STUDIES: I have personally reviewed the radiological reports and agreed with the findings in the report.  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 It is considered a precancerous lesion. DCIS is classified as a 0. It is generally detected through mammograms as calcifications. We discussed the significance of grades and its impact on prognosis. We also discussed the importance of ER and PR receptors and their implications to adjuvant treatment options. Prognosis of DCIS dependence on grade, comedo necrosis. It is anticipated that if not treated, 20-30% of DCIS can develop into invasive breast cancer.  Recommendation: 1. Breast conserving surgery 2. Followed by adjuvant radiation therapy   Return to clinic after surgery to discuss the final pathology report and come up with an adjuvant treatment plan.  Assessment and Plan Assessment & Plan Ductal carcinoma in situ (DCIS) of breast DCIS identified via mammogram, 7 mm, grade 3, 20% risk of invasive transformation if untreated. ER/PR negative, stage 0, non-invasive. - Proceed with lumpectomy to remove DCIS. - Review final pathology post-surgery to confirm absence of invasive cancer. - Consider radiation therapy post-surgery  if pathology confirms DCIS only. - Discuss further treatment options if invasive cancer is detected post-surgery.     All questions were answered. The patient knows to call the clinic with any problems, questions or concerns.    Viinay K  Alis Sawchuk, MD 09/24/23

## 2023-09-24 NOTE — Progress Notes (Signed)
 REFERRING PROVIDER: Cameron Cea, MD [address] [place],  [place] [address]  PRIMARY PROVIDER:  Colene Dauphin, MD  PRIMARY REASON FOR VISIT:  1. Ductal carcinoma in situ (DCIS) of right breast   2. Family history of ovarian cancer     HISTORY OF PRESENT ILLNESS:   Toni [name], a 78 y.o. female, was seen for a [place] cancer genetics consultation at the request of Dr. Lee Public  due to a personal history of breast cancer.  Toni [name] presents to clinic today to discuss the possibility of a hereditary predisposition to cancer, to discuss genetic testing, and to further clarify her future cancer risks, as well as potential cancer risks for family members.   In April 2025, at the age of 78, Toni [name] was diagnosed with ductal carcinoma in situ of the right breast (ER-/PR-).  The treatment plan includes breast conserving surgery and adjuvant radiation.     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%, PR 0%   09/24/2023 Cancer Staging   Staging form: Breast, AJCC 8th Edition - Clinical: Stage 0 (cTis (DCIS), cN0, cM0, G3, ER-, PR-, HER2: Not Assessed) - Signed by Cameron Cea, MD on 09/24/2023 Stage prefix: Initial diagnosis Histologic grading system: 3 grade system       Past Medical History:  Diagnosis Date   Aortic sclerosis 2000   on ECHO   Arthritis    knees   Breast cancer (HCC)    Colon polyps    Dr Adan Holms   Diverticulosis of colon (without mention of hemorrhage)    Esophageal reflux    Eye abnormality    OD ocular freckle   H/O hiatal hernia    Hyperlipidemia 1999   LDL 158, resolved with diet   Hypertension    Premature ventricular contractions    Stricture and stenosis of esophagus    dilation X 3   SVT (supraventricular  tachycardia) (HCC)    slow pathway ablated    Past Surgical History:  Procedure Laterality Date   ANKLE SURGERY     right   BREAST BIOPSY Right 09/12/2023   MM RT BREAST BX W LOC DEV 1ST LESION IMAGE BX SPEC STEREO GUIDE 09/12/2023 [facility]AMMO[facility]   COLONOSCOPY  10/14/2014   pyrtle   COLONOSCOPY W/ POLYPECTOMY  2011   Dr Adan Holms   KNEE ARTHROSCOPY     bilateral   ovarian wedge resection     to allow pregnancy   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   SUPRAVENTRICULAR TACHYCARDIA ABLATION N/A 06/08/2013   Procedure: SUPRAVENTRICULAR TACHYCARDIA ABLATION;  Surgeon: Ellaree Gunther, MD;  Location: [facility];  Service: Cardiovascular;  Laterality: N/A;   TONSILLECTOMY     UPPER GASTROINTESTINAL ENDOSCOPY     [facility] yearly   wisdom teeth ext      FAMILY HISTORY:  We obtained a detailed, 4-generation family history.  Significant diagnoses are listed below: Family History  Problem Relation Age of Onset   Ovarian cancer Sister        dx 20s     Toni [name] is unaware of previous family history of genetic testing for hereditary cancer risks. There is no known Ashkenazi Jewish ancestry. There is no known consanguinity.  GENETIC COUNSELING ASSESSMENT: Toni [name] is a 78 y.o. female with a personal and family  history which is somewhat suggestive of a hereditary cancer syndrome and predisposition to cancer given her history of breast cancer and her sister's reported history of ovarian cancer. We, therefore, discussed and recommended the following at today's visit.   DISCUSSION: We discussed that 5 - 10% of cancer is hereditary.  Most cases of hereditary breast and ovarian cancer are associated with mutations in BRCA1/2.  There are other genes that can be associated with hereditary breast, ovarian, or other cancer syndromes..  We discussed that testing is beneficial for several reasons including knowing how to follow individuals for their cancer risks and understanding if  other family members could be at an increased risk for cancer and allowing them to undergo genetic testing.   We reviewed the characteristics, features and inheritance patterns of hereditary cancer syndromes. We also discussed genetic testing, including the appropriate family members to test, the process of testing, insurance coverage and turn-around-time for results. We discussed the implications of a negative, positive, carrier and/or variant of uncertain significant result. We recommended Toni [name] pursue genetic testing for a panel that includes genes associated with breast, ovarian, and other cancer.   Toni [name]  was offered a common hereditary cancer panel (~40 genes) and an expanded pan-cancer panel (~70 genes). Toni [name] was informed of the benefits and limitations of each panel, including that expanded pan-cancer panels contain genes that do not have clear management guidelines at this point in time.  We also discussed that as the number of genes included on a panel increases, the chances of variants of uncertain significance increases.  After considering the benefits and limitations of each gene panel, Toni [name]  elected to have an expanded pan-cancer panel through [employer] [facility] [employer]  The CancerNext-Expanded gene panel offered by [facility] and includes sequencing, rearrangement, and RNA analysis for the following 77 genes: AIP, ALK, APC, ATM, AXIN2, BAP1, BARD1, BMPR1A, BRCA1, BRCA2, BRIP1, CDC73, CDH1, CDK4, CDKN1B, CDKN2A, CEBPA, CHEK2, CTNNA1, DDX41, DICER1, ETV6, FH, FLCN, GATA2, LZTR1, MAX, MBD4, MEN1, MET, MLH1, MSH2, MSH3, MSH6, MUTYH, NF1, NF2, NTHL1, PALB2, PHOX2B, PMS2, POT1, PRKAR1A, PTCH1, PTEN, RAD51C, RAD51D, RB1, RET, RUNX1, RSP20, SDHA, SDHAF2, SDHB, SDHC, SDHD, SMAD4, SMARCA4, SMARCB1, SMARCE1, STK11, SUFU, TMEM127, TP53, TSC1, TSC2, VHL, and WT1 (sequencing and deletion/duplication); EGFR, HOXB13, KIT, MITF, PDGFRA, POLD1, and POLE (sequencing only); EPCAM and GREM1  (deletion/duplication only).   Based on Toni [name] personal history of breast cancer and her sister's reported history of ovarian cancer, she meets NCCN medical criteria for genetic testing. She is the most informative relative available for genetic testing.   We discussed the Genetic Information Non-Discrimination Act (GINA) of 2008, which helps protect individuals against genetic discrimination based on their genetic test results.  It impacts both health insurance and employment.  With health insurance, it protects against genetic test results being used for increased premiums or policy termination. For employment, it protects against hiring, firing and promoting decisions based on genetic test results.  GINA does not apply to those in the [employer], those who work for companies with less than 15 employees, and new life insurance or long-term disability insurance policies.  Health status due to a cancer diagnosis is not protected under GINA.  PLAN: After considering the risks, benefits, and limitations, Toni [name] provided informed consent to pursue genetic testing and the blood sample was sent to [facility] for analysis of the [facility]-Expanded +RNAinsight. Results should be available within approximately 3 weeks, at which point they will be disclosed by telephone to  Toni [name], as will any additional recommendations warranted by these results. Toni [name] will receive a summary of her genetic counseling visit and a copy of her results once available. This information will also be available in Epic.   Toni [name] questions were answered to her satisfaction today. Our contact information was provided should additional questions or concerns arise. Thank you for the referral and allowing us  to share in the care of your patient.   Toni [name] M. Ora Billing, MS, [facility] Genetic Counselor [email or web address] (P) [phone]   35 minutes were spent on the date of the encounter in service  to the patient including preparation, face-to-face consultation, documentation and care coordination.  The patient was accompanied by her husband. Dr. Gudena was available to discuss this case as needed.    _______________________________________________________________________ For Office Staff:  Number of people involved in session: 2 Was an Intern/ student involved with case: no

## 2023-09-24 NOTE — Progress Notes (Signed)
 Radiation Oncology         [phone]) [phone] ________________________________  Name: Toni [name]        MRN: [id number]  Date of Service: 09/24/2023 DOB: Aug 03, 1945  WJ:[id number], Beckey Bourgeois, MD  Caralyn Chandler, MD     REFERRING PHYSICIAN: Lillette Reid III, MD   DIAGNOSIS: The encounter diagnosis was Ductal carcinoma in situ (DCIS) of right breast.   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  I have personally performed a face to face diagnostic evaluation on this patient and devised the following assessment and plan.  The patient was seen today in clinic preoperatively for her diagnosis of DCIS of the right breast, grade 3 which is estrogen receptor negative and progesterone receptor negative. The patient appears to be a good candidate for breast conservation treatment. We discussed the recommendation to subsequently proceed with adjuvant radiation treatment approximately 4-6 weeks after surgery. We discussed the potential benefit of treatment as well as possible side effects and risks. All of the patient's questions were answered. I look forward to seeing the patient at the postoperatively to further coordinate her care. I would anticipate treating the patient with tangent fields to the right breast radiation treatment fields for 4 weeks.    Alix Isaac, MD,  PhD ----------------------------------------------------------------------------------------------------------------------------------      HISTORY OF PRESENT ILLNESS: Toni [name] is a 78 y.o. female seen in the multidisciplinary breast clinic for a new diagnosis of right breast cancer. The patient was noted to have an asymmetry on screening mammogram. She proceeded with diagnostic mammogram on 08/22/2023 that showed a 0.7 cm group of calcifications in the right breast. Right axilla ultrasound was not indicated. Accordingly, patient underwent a biopsy on 09/12/2023 that revealed grade 3 ductal carcinoma in situ that was ER and PR negative.  She is seen today to discuss treatment recommendations of her cancer.       PREVIOUS RADIATION THERAPY: No   PAST MEDICAL HISTORY:  Past Medical History:  Diagnosis Date   Aortic sclerosis 2000   on ECHO   Arthritis    knees   Colon polyps    Dr Adan Holms   Diverticulosis of colon (without mention of hemorrhage)    Esophageal reflux    Eye abnormality    OD ocular freckle   H/O hiatal hernia    Hyperlipidemia 1999   LDL 158, resolved with diet   Premature ventricular contractions    Stricture and stenosis of esophagus    dilation X 3   SVT (supraventricular tachycardia) (HCC)    slow pathway ablated       PAST SURGICAL HISTORY: Past Surgical History:  Procedure Laterality Date   ANKLE SURGERY     right   BREAST BIOPSY Right 09/12/2023   MM RT BREAST BX W LOC DEV 1ST LESION IMAGE BX SPEC STEREO GUIDE 09/12/2023 [facility]   COLONOSCOPY  10/14/2014   pyrtle   COLONOSCOPY W/ POLYPECTOMY  2011   Dr Adan Holms   KNEE ARTHROSCOPY     bilateral  ovarian wedge resection     to allow pregnancy   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   SUPRAVENTRICULAR TACHYCARDIA ABLATION N/A 06/08/2013   Procedure: SUPRAVENTRICULAR TACHYCARDIA ABLATION;  Surgeon: Ellaree Gunther, MD;  Location: [facility]ATH [facility];  Service: Cardiovascular;   Laterality: N/A;   TONSILLECTOMY     UPPER GASTROINTESTINAL ENDOSCOPY     [facility] yearly   wisdom teeth ext       FAMILY HISTORY:  Family History  Problem Relation Age of Onset   Stroke Father 80   Arthritis Mother        OA   Hypertension Mother    Hypothyroidism Mother    Stroke Mother 69       ischemic   Heart attack Sister 66       smoker   Ovarian cancer Sister    Osteopenia Sister    Hypertrophic cardiomyopathy Sister    COPD Sister    Heart failure Sister    Hypothyroidism Sister    Cardiomyopathy Sister    Emphysema Sister    Diabetes Maternal Grandmother    Colon cancer Neg Hx    Esophageal cancer Neg Hx    Liver disease Neg Hx    Stomach cancer Neg Hx    Rectal cancer Neg Hx      SOCIAL HISTORY:  reports that she has never smoked. She has never used smokeless tobacco. She reports that she does not currently use alcohol. She reports that she does not use drugs.   ALLERGIES: Dexilant [dexlansoprazole], Prevacid [lansoprazole], and Prilosec [omeprazole]   MEDICATIONS:  Current Outpatient Medications  Medication Sig Dispense Refill   amLODipine  (NORVASC ) 2.5 MG tablet Take 1 tablet (2.5 mg total) by mouth daily. 90 tablet 3   Ascorbic Acid (VITAMIN C) 1000 MG tablet Take 1,000 mg by mouth daily.     Cholecalciferol (VITAMIN D) 50 MCG (2000 UT) CAPS Take by mouth.     pantoprazole  (PROTONIX ) 40 MG tablet Take 40 mg by mouth daily.     tiZANidine  (ZANAFLEX ) 2 MG tablet TAKE 1 TABLET BY MOUTH EVERYDAY AT BEDTIME (Patient taking differently: Take 2 mg by mouth as needed.) 90 tablet 1   No current facility-administered medications for this visit.     REVIEW OF SYSTEMS: On review of systems, the patient reports that she is doing well overall. No breast specific complaints are verbalized.       PHYSICAL EXAM:  Wt Readings from Last 3 Encounters:  09/11/23 174 lb (78.9 kg)  04/28/23 174 lb (78.9 kg)  04/23/23 174 lb 9.6 oz (79.2 kg)   Temp  Readings from Last 3 Encounters:  03/04/23 98.6 F (37 C) (Oral)  06/17/22 98.7 F (37.1 C) (Oral)  08/17/21 98.4 F (36.9 C) (Oral)   BP Readings from Last 3 Encounters:  04/28/23 136/84  04/23/23 (!) 152/84  03/24/23 (!) 153/75   Pulse Readings from Last 3 Encounters:  04/28/23 77  04/23/23 69  03/24/23 75    In general this is a well appearing female in no acute distress. She's alert and oriented x4 and appropriate throughout the examination. Cardiopulmonary assessment is negative for acute distress and she exhibits normal effort. Bilateral breast exam is deferred.    ECOG = 0  0 - Asymptomatic (Fully active, able to carry on all predisease activities without restriction)  1 - Symptomatic but completely ambulatory (Restricted in physically strenuous activity but ambulatory and able to carry out work of a light or sedentary nature.  For example, light housework, office work)  2 - Symptomatic, <50% in bed during the day (Ambulatory and capable of all self care but unable to carry out any work activities. Up and about more than 50% of waking hours)  3 - Symptomatic, >50% in bed, but not bedbound (Capable of only limited self-care, confined to bed or chair 50% or more of waking hours)  4 - Bedbound (Completely disabled. Cannot carry on any self-care. Totally confined to bed or chair)  5 - Death   Aurea Blossom MM, Creech RH, Tormey DC, et al. [phone]). "Toxicity and response criteria of the [facility]". Am. Hillard Lowes. Oncol. 5 (6): 649-55    LABORATORY DATA:  Lab Results  Component Value Date   WBC 7.9 03/04/2023   HGB 14.5 03/04/2023   HCT 44.1 03/04/2023   MCV 87.8 03/04/2023   PLT 321.0 03/04/2023   Lab Results  Component Value Date   NA 140 03/04/2023   K 4.6 03/04/2023   CL 103 03/04/2023   CO2 31 03/04/2023   Lab Results  Component Value Date   ALT 10 03/04/2023   AST 13 03/04/2023   ALKPHOS 95 03/04/2023   BILITOT 0.6 03/04/2023       RADIOGRAPHY: MM RT BREAST BX W LOC DEV 1ST LESION IMAGE BX SPEC STEREO GUIDE Addendum Date: 09/16/2023 ADDENDUM REPORT: 09/16/2023 13:40 ADDENDUM: PATHOLOGY revealed: 1. Breast, right, needle core biopsy, UOQ; 7 mm ; group indeterm microcalcs - DUCTAL CARCINOMA IN SITU, HIGH NUCLEAR GRADE. NECROSIS: FOCAL. CALCIFICATIONS: PRESENT - USUAL DUCTAL HYPERPLASIA AND FIBROCYSTIC CHANGES. Pathology results are CONCORDANT with imaging findings, per Dr. Roda Cirri. Pathology results and recommendations were discussed with patient via telephone on 09/15/2023 by Ladonna Pickup RN. Patient reported biopsy site doing well with no adverse symptoms, and only slight tenderness at the site. Post biopsy care instructions were reviewed, questions were answered and my direct phone number was provided. Patient was instructed to call [facility] for any additional questions or concerns related to biopsy site. RECOMMENDATIONS: 1. Surgical and oncological consultation. Patient was referred to the [facility] with appointment on 09/24/2023. Patient aware of appointment details. 2. Recommend pretreatment bilateral breast MRI with and without contrast to determine extent of breast disease given diagnosis of high grade DCIS. Pathology results reported by Ladonna Pickup RN on 09/16/2023. Electronically Signed   By: Roda Cirri M.D.   On: 09/16/2023 13:40   Result Date: 09/16/2023 CLINICAL DATA:  Patient presents for stereotactic core needle biopsy of a 7 mm group of indeterminate microcalcifications over the right upper outer quadrant. EXAM: RIGHT BREAST STEREOTACTIC CORE NEEDLE BIOPSY COMPARISON:  Previous exam(s). FINDINGS: The patient and I discussed the procedure of stereotactic-guided biopsy including benefits and alternatives. We discussed the high likelihood of a successful procedure. We discussed the risks of the procedure including infection,  bleeding, tissue injury, clip migration, and inadequate sampling. Informed written consent was given. The usual time out protocol was performed immediately prior to the procedure. Using sterile technique and 1% Lidocaine  as local anesthetic, under stereotactic guidance, a 9 gauge vacuum assisted device was used to perform core needle biopsy of targeted indeterminate microcalcifications over the right upper outer quadrant using a superior to inferior approach. Specimen radiograph was performed showing several of the targeted microcalcifications. Specimens with calcifications are identified for pathology. Lesion quadrant: Right upper outer quadrant. At the conclusion of the procedure, an X shaped tissue  marker clip was deployed into the biopsy cavity. Follow-up 2-view mammogram was performed and dictated separately. IMPRESSION: Stereotactic-guided biopsy of indeterminate right breast microcalcifications. No apparent complications. Electronically Signed: By: Roda Cirri M.D. On: 09/12/2023 10:57   MM CLIP PLACEMENT RIGHT Result Date: 09/12/2023 CLINICAL DATA:  Patient is post stereotactic core needle biopsy of a 7 mm group of indeterminate microcalcifications over the right upper outer quadrant. EXAM: 3D DIAGNOSTIC RIGHT MAMMOGRAM POST ULTRASOUND BIOPSY COMPARISON:  Previous exam(s). ACR Breast Density Category b: There are scattered areas of fibroglandular density. FINDINGS: 3D Mammographic images were obtained following stereotactic guided biopsy of indeterminate microcalcifications over the right upper outer quadrant. The biopsy marking clip is in expected position at the site of biopsy on the cc image and likely approximately 1 cm inferior to the biopsy site on the ML image. IMPRESSION: Positioning of the X shaped biopsy marking clip at the site of biopsy in the right upper outer quadrant as note the clip is likely approximally 1 cm inferior to the biopsy site on the ML image. Final Assessment: Post Procedure  Mammograms for Marker Placement Electronically Signed   By: Roda Cirri M.D.   On: 09/12/2023 11:00       IMPRESSION/PLAN: 1. Stage 0 (cTis, N0, M0) high grade DCIS, ER/PR- Dr. Jeryl Moris discussed the pathology findings and reviewed the nature of early stage breast disease. The consensus from the breast conference includes lumpectomy followed by adjuvant radiation. Dr. Jeryl Moris recommends external beam radiotherapy to the breast following her lumpectomy to reduce risks of local recurrence. We discussed the risks, benefits, short, and long term effects of radiotherapy, as well as the curative intent, and the patient is interested in proceeding. Dr. Jeryl Moris discussed the delivery and logistics of radiotherapy and anticipates a course of 4 weeks of radiotherapy to the right breast. We will see her back a few weeks after surgery to discuss the simulation process and anticipate starting radiotherapy about 4-6 weeks after surgery.   2. Possible genetic predisposition to malignancy. The patient is a candidate for genetic testing given her family history. She will meet with a geneticist in the near future.   In a visit lasting 60 minutes, greater than 50% of the time was spent face to face reviewing her case, as well as in preparation of, discussing, and coordinating the patient's care.  The above documentation reflects my direct findings during this shared patient visit. Please see the separate note by Dr. Jeryl Moris on this date for the remainder of the patient's plan of care.    Amiel Kalata, [place]    **Disclaimer: This note was dictated with voice recognition software. Similar sounding words can inadvertently be transcribed and this note may contain transcription errors which may not have been corrected upon publication of note.**

## 2023-09-25 ENCOUNTER — Other Ambulatory Visit: Payer: Self-pay | Admitting: General Surgery

## 2023-09-25 DIAGNOSIS — D0511 Intraductal carcinoma in situ of right breast: Secondary | ICD-10-CM

## 2023-09-26 ENCOUNTER — Encounter: Payer: Self-pay | Admitting: *Deleted

## 2023-09-26 ENCOUNTER — Telehealth: Payer: Self-pay | Admitting: *Deleted

## 2023-09-26 DIAGNOSIS — D0511 Intraductal carcinoma in situ of right breast: Secondary | ICD-10-CM

## 2023-09-26 NOTE — Telephone Encounter (Signed)
 Spoke to pt concerning [facility] from 09/24/23. Denies questions or concerns regarding dx or treatment care plan. Encourage pt to call with needs. Received verbal understanding.

## 2023-09-29 ENCOUNTER — Telehealth: Payer: Self-pay | Admitting: Radiation Oncology

## 2023-09-29 NOTE — Telephone Encounter (Signed)
 Left message for patient to call back to schedule consult per 5/2 referral.

## 2023-10-10 ENCOUNTER — Ambulatory Visit: Payer: Self-pay | Admitting: Genetic Counselor

## 2023-10-10 ENCOUNTER — Encounter (HOSPITAL_BASED_OUTPATIENT_CLINIC_OR_DEPARTMENT_OTHER): Payer: Self-pay | Admitting: General Surgery

## 2023-10-10 ENCOUNTER — Other Ambulatory Visit: Payer: Self-pay

## 2023-10-10 ENCOUNTER — Encounter: Payer: Self-pay | Admitting: Genetic Counselor

## 2023-10-10 DIAGNOSIS — Z1379 Encounter for other screening for genetic and chromosomal anomalies: Secondary | ICD-10-CM

## 2023-10-10 DIAGNOSIS — D0511 Intraductal carcinoma in situ of right breast: Secondary | ICD-10-CM

## 2023-10-10 NOTE — Progress Notes (Signed)
 HPI:   Toni [name] was previously seen in the  Cancer Genetics clinic due to a personal history of breast cancer and concerns regarding a hereditary predisposition to cancer.    Toni [name] recent genetic test results were disclosed to her by telephone. These results and recommendations are discussed in more detail below.  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%, PR 0%   09/24/2023 Cancer Staging   Staging form: Breast, AJCC 8th Edition - Clinical: Stage 0 (cTis (DCIS), cN0, cM0, G3, ER-, PR-, HER2: Not Assessed) - Signed by Cameron Cea, MD on 09/24/2023 Stage prefix: Initial diagnosis Histologic grading system: 3 grade system   10/06/2023 Genetic Testing   Negative [facility] CancerNext-Expanded +RNAinsight Panel.  Report date is 10/06/2023.   The CancerNext-Expanded gene panel offered by [facility] and includes sequencing, rearrangement, and RNA analysis for the following 77 genes: AIP, ALK, APC, ATM, AXIN2, BAP1, BARD1, BMPR1A, BRCA1, BRCA2, BRIP1, CDC73, CDH1, CDK4, CDKN1B, CDKN2A, CEBPA, CHEK2, CTNNA1, DDX41, DICER1, ETV6, FH, FLCN, GATA2, LZTR1, MAX, MBD4, MEN1, MET, MLH1, MSH2, MSH3, MSH6, MUTYH, NF1, NF2, NTHL1, PALB2, PHOX2B, PMS2, POT1, PRKAR1A, PTCH1, PTEN, RAD51C, RAD51D, RB1, RET, RSP20, RUNX1, SDHA, SDHAF2, SDHB, SDHC, SDHD, SMAD4, SMARCA4, SMARCB1, SMARCE1, STK11, SUFU, TMEM127, TP53, TSC1, TSC2, VHL, and WT1 (sequencing and deletion/duplication); EGFR, HOXB13, KIT, MITF, PDGFRA, POLD1, and POLE (sequencing only); EPCAM and GREM1 (deletion/duplication only).      FAMILY HISTORY:  We obtained a detailed, 4-generation family history.  Significant diagnoses are listed below:      Family History  Problem Relation Age of Onset   Ovarian cancer Sister           dx 69s       Toni [name] is unaware of previous family history of genetic testing for hereditary cancer risks. There is no known Ashkenazi Jewish ancestry. There is no known consanguinity.    GENETIC TEST RESULTS:  The [facility] CancerNext-Expanded +RNAinsight Panel found no pathogenic mutations.   The CancerNext-Expanded gene panel offered by [facility] and includes sequencing, rearrangement, and RNA analysis for the following 77 genes: AIP, ALK, APC, ATM, AXIN2, BAP1, BARD1, BMPR1A, BRCA1, BRCA2, BRIP1, CDC73, CDH1, CDK4, CDKN1B, CDKN2A, CEBPA, CHEK2, CTNNA1, DDX41, DICER1, ETV6, FH, FLCN, GATA2, LZTR1, MAX, MBD4, MEN1, MET, MLH1, MSH2, MSH3, MSH6, MUTYH, NF1, NF2, NTHL1, PALB2, PHOX2B, PMS2, POT1, PRKAR1A, PTCH1, PTEN, RSP20, RAD51C, RAD51D, RB1, RET, RUNX1, SDHA, SDHAF2, SDHB, SDHC, SDHD, SMAD4, SMARCA4, SMARCB1, SMARCE1, STK11, SUFU, TMEM127, TP53, TSC1, TSC2, VHL, and WT1 (sequencing and deletion/duplication); EGFR, HOXB13, KIT, MITF, PDGFRA, POLD1, and POLE (sequencing only); EPCAM and GREM1 (deletion/duplication only).   The test report has been scanned into EPIC and is located under the Molecular Pathology section of the Results Review tab.  A portion of the result report is included below for reference. Genetic testing reported out on Oct 06, 2023.     Even though a pathogenic variant was not identified, possible explanations for the cancer in the family may include: There may be no hereditary risk for cancer in the family. The cancers in Toni [name] and/or her family may be sporadic/familial or due to other genetic and environmental factors.  Most cancer is not hereditary.  There may be a gene mutation in one of these genes that current testing methods cannot detect but that chance is  small. There could be another gene that has not yet been discovered, or that we have not yet tested, that is responsible for the cancer diagnoses in the family.  It is also possible there is a  hereditary cause for the cancer in the family that Toni [name] did not inherit.  Therefore, it is important to remain in touch with cancer genetics in the future so that we can continue to offer Toni [name] the most up to date genetic testing.    ADDITIONAL GENETIC TESTING:   Toni [name] genetic testing was fairly extensive.  If there are additional relevant genes identified to increase cancer risk that can be analyzed in the future, we would be happy to discuss and coordinate this testing at that time.     CANCER SCREENING RECOMMENDATIONS:  Toni [name] test result is considered negative (normal).  This means that we have not identified a hereditary cause for her personal history of breast cancer at this time.   An individual's cancer risk and medical management are not determined by genetic test results alone. Overall cancer risk assessment incorporates additional factors, including personal medical history, family history, and any available genetic information that may result in a personalized plan for cancer prevention and surveillance. Therefore, it is recommended she continue to follow the cancer management and screening guidelines provided by her oncology and primary healthcare provider.   RECOMMENDATIONS FOR FAMILY MEMBERS:   Since she did not inherit a identifiable mutation in a cancer predisposition gene included on this panel, her children could not have inherited a known mutation from her in one of these genes. Individuals in this family might be at some increased risk of developing cancer, over the general population risk, due to the family history of cancer.  Individuals in the family should notify their providers of the family history of cancer. We recommend women in this family have a yearly mammogram beginning at age 8, or 55 years younger than the earliest onset of cancer, an annual clinical breast exam, and perform monthly breast self-exams.  Risk models that take into  account family history and hormonal history may be helpful in determining appropriate breast cancer screening options for family members.  Other members of the family may still carry a pathogenic variant in one of these genes that Toni [name] did not inherit. Based on the family history, we recommend the children of her sister who had ovarian cancer, have genetic counseling and testing. Toni [name] can let us  know if we can be of any assistance in coordinating genetic counseling and/or testing for these family members.     FOLLOW-UP:  Cancer genetics is a rapidly advancing field and it is possible that new genetic tests will be appropriate for her and/or her family members in the future. We encourage Toni [name] to remain in contact with cancer genetics, so we can update her personal and family histories and let her know of advances in cancer genetics that may benefit this family.   Our contact number was provided.  They are welcome to call us  at anytime with additional questions or concerns.   Hafiz Irion M. Ora Billing, MS, [facility] Genetic Counselor [email or web address] (P) [phone]

## 2023-10-14 ENCOUNTER — Ambulatory Visit
Admission: RE | Admit: 2023-10-14 | Discharge: 2023-10-14 | Disposition: A | Discharge status: Home / Self Care | Source: Ambulatory Visit | Attending: General Surgery | Admitting: General Surgery

## 2023-10-14 DIAGNOSIS — D0511 Intraductal carcinoma in situ of right breast: Secondary | ICD-10-CM

## 2023-10-14 HISTORY — PX: BREAST BIOPSY: SHX20

## 2023-10-14 MED ORDER — CHLORHEXIDINE GLUCONATE CLOTH 2 % EX PADS: 6.0000 | MEDICATED_PAD | Freq: Once | CUTANEOUS | Status: DC

## 2023-10-14 NOTE — Progress Notes (Signed)

## 2023-10-17 ENCOUNTER — Ambulatory Visit (HOSPITAL_BASED_OUTPATIENT_CLINIC_OR_DEPARTMENT_OTHER)
Admission: RE | Admit: 2023-10-17 | Discharge: 2023-10-17 | Disposition: A | Discharge status: Home / Self Care | Attending: General Surgery | Admitting: General Surgery

## 2023-10-17 ENCOUNTER — Other Ambulatory Visit: Payer: Self-pay

## 2023-10-17 ENCOUNTER — Ambulatory Visit (HOSPITAL_BASED_OUTPATIENT_CLINIC_OR_DEPARTMENT_OTHER): Admitting: Anesthesiology

## 2023-10-17 ENCOUNTER — Encounter (HOSPITAL_BASED_OUTPATIENT_CLINIC_OR_DEPARTMENT_OTHER): Payer: Self-pay | Admitting: General Surgery

## 2023-10-17 ENCOUNTER — Ambulatory Visit
Admission: RE | Admit: 2023-10-17 | Discharge: 2023-10-17 | Disposition: A | Discharge status: Home / Self Care | Source: Ambulatory Visit | Attending: General Surgery | Admitting: General Surgery

## 2023-10-17 ENCOUNTER — Encounter (HOSPITAL_BASED_OUTPATIENT_CLINIC_OR_DEPARTMENT_OTHER)
Admission: RE | Disposition: A | Discharge status: Home / Self Care | Payer: Self-pay | Source: Home / Self Care | Attending: General Surgery

## 2023-10-17 DIAGNOSIS — N6089 Other benign mammary dysplasias of unspecified breast: Secondary | ICD-10-CM | POA: Insufficient documentation

## 2023-10-17 DIAGNOSIS — N6021 Fibroadenosis of right breast: Secondary | ICD-10-CM | POA: Diagnosis not present

## 2023-10-17 DIAGNOSIS — D0511 Intraductal carcinoma in situ of right breast: Secondary | ICD-10-CM

## 2023-10-17 DIAGNOSIS — K219 Gastro-esophageal reflux disease without esophagitis: Secondary | ICD-10-CM | POA: Insufficient documentation

## 2023-10-17 DIAGNOSIS — N6081 Other benign mammary dysplasias of right breast: Secondary | ICD-10-CM | POA: Diagnosis not present

## 2023-10-17 DIAGNOSIS — Z79899 Other long term (current) drug therapy: Secondary | ICD-10-CM | POA: Diagnosis not present

## 2023-10-17 DIAGNOSIS — C50411 Malignant neoplasm of upper-outer quadrant of right female breast: Secondary | ICD-10-CM | POA: Diagnosis not present

## 2023-10-17 DIAGNOSIS — I10 Essential (primary) hypertension: Secondary | ICD-10-CM | POA: Insufficient documentation

## 2023-10-17 DIAGNOSIS — N6091 Unspecified benign mammary dysplasia of right breast: Secondary | ICD-10-CM | POA: Diagnosis not present

## 2023-10-17 DIAGNOSIS — C50911 Malignant neoplasm of unspecified site of right female breast: Secondary | ICD-10-CM | POA: Diagnosis not present

## 2023-10-17 HISTORY — PX: BREAST LUMPECTOMY WITH RADIOACTIVE SEED LOCALIZATION: SHX6424

## 2023-10-17 SURGERY — BREAST LUMPECTOMY WITH RADIOACTIVE SEED LOCALIZATION
Anesthesia: General | Site: Breast | Laterality: Right

## 2023-10-17 MED ORDER — FENTANYL CITRATE (PF) 100 MCG/2ML IJ SOLN
INTRAMUSCULAR | Status: AC
  Filled 2023-10-17: qty 2

## 2023-10-17 MED ORDER — LACTATED RINGERS IV SOLN: INTRAVENOUS | Status: DC

## 2023-10-17 MED ORDER — PROPOFOL 500 MG/50ML IV EMUL
INTRAVENOUS | Status: DC | PRN
  Administered 2023-10-17: 150 ug/kg/min via INTRAVENOUS

## 2023-10-17 MED ORDER — OXYCODONE HCL 5 MG PO TABS: 5.0000 mg | ORAL_TABLET | Freq: Four times a day (QID) | ORAL | 0 refills | Status: DC | PRN

## 2023-10-17 MED ORDER — OXYCODONE HCL 5 MG/5ML PO SOLN: 5.0000 mg | Freq: Once | ORAL | Status: DC | PRN

## 2023-10-17 MED ORDER — ACETAMINOPHEN 10 MG/ML IV SOLN: 1000.0000 mg | Freq: Once | INTRAVENOUS | Status: DC | PRN

## 2023-10-17 MED ORDER — ACETAMINOPHEN 500 MG PO TABS
1000.0000 mg | ORAL_TABLET | ORAL | Status: AC
Start: 2023-10-17 — End: 2023-10-17
  Administered 2023-10-17: 1000 mg via ORAL

## 2023-10-17 MED ORDER — BUPIVACAINE-EPINEPHRINE (PF) 0.25% -1:200000 IJ SOLN
INTRAMUSCULAR | Status: DC | PRN
  Administered 2023-10-17: 20 mL via PERINEURAL

## 2023-10-17 MED ORDER — FENTANYL CITRATE (PF) 100 MCG/2ML IJ SOLN
INTRAMUSCULAR | Status: DC | PRN
Start: 2023-10-17 — End: 2023-10-17
  Administered 2023-10-17: 75 ug via INTRAVENOUS
  Administered 2023-10-17: 25 ug via INTRAVENOUS

## 2023-10-17 MED ORDER — LIDOCAINE 2% (20 MG/ML) 5 ML SYRINGE
INTRAMUSCULAR | Status: AC
  Filled 2023-10-17: qty 5

## 2023-10-17 MED ORDER — ONDANSETRON HCL 4 MG/2ML IJ SOLN
INTRAMUSCULAR | Status: AC
  Filled 2023-10-17: qty 2

## 2023-10-17 MED ORDER — GABAPENTIN 100 MG PO CAPS
100.0000 mg | ORAL_CAPSULE | ORAL | Status: AC
  Administered 2023-10-17: 100 mg via ORAL

## 2023-10-17 MED ORDER — PROPOFOL 10 MG/ML IV BOLUS
INTRAVENOUS | Status: DC | PRN
  Administered 2023-10-17: 110 mg via INTRAVENOUS
  Administered 2023-10-17: 20 mg via INTRAVENOUS

## 2023-10-17 MED ORDER — FENTANYL CITRATE (PF) 100 MCG/2ML IJ SOLN: 25.0000 ug | INTRAMUSCULAR | Status: DC | PRN

## 2023-10-17 MED ORDER — PROPOFOL 10 MG/ML IV BOLUS
INTRAVENOUS | Status: AC
  Filled 2023-10-17: qty 20

## 2023-10-17 MED ORDER — OXYCODONE HCL 5 MG PO TABS: 5.0000 mg | ORAL_TABLET | Freq: Once | ORAL | Status: DC | PRN

## 2023-10-17 MED ORDER — GABAPENTIN 100 MG PO CAPS
ORAL_CAPSULE | ORAL | Status: AC
  Filled 2023-10-17: qty 1

## 2023-10-17 MED ORDER — DEXAMETHASONE SODIUM PHOSPHATE 10 MG/ML IJ SOLN
INTRAMUSCULAR | Status: AC
  Filled 2023-10-17: qty 1

## 2023-10-17 MED ORDER — DEXAMETHASONE SODIUM PHOSPHATE 10 MG/ML IJ SOLN
INTRAMUSCULAR | Status: DC | PRN
  Administered 2023-10-17: 8 mg via INTRAVENOUS

## 2023-10-17 MED ORDER — LIDOCAINE 2% (20 MG/ML) 5 ML SYRINGE
INTRAMUSCULAR | Status: DC | PRN
  Administered 2023-10-17: 40 mg via INTRAVENOUS

## 2023-10-17 MED ORDER — ACETAMINOPHEN 500 MG PO TABS
ORAL_TABLET | ORAL | Status: AC
  Filled 2023-10-17: qty 2

## 2023-10-17 MED ORDER — CEFAZOLIN SODIUM-DEXTROSE 2-4 GM/100ML-% IV SOLN
2.0000 g | INTRAVENOUS | Status: AC
  Administered 2023-10-17: 2 g via INTRAVENOUS

## 2023-10-17 MED ORDER — CEFAZOLIN SODIUM-DEXTROSE 2-4 GM/100ML-% IV SOLN
INTRAVENOUS | Status: AC
  Filled 2023-10-17: qty 100

## 2023-10-17 MED ORDER — ONDANSETRON HCL 4 MG/2ML IJ SOLN
INTRAMUSCULAR | Status: DC | PRN
  Administered 2023-10-17: 4 mg via INTRAVENOUS

## 2023-10-17 SURGICAL SUPPLY — 32 items
BLADE SURG 15 STRL LF DISP TIS (BLADE) ×1 IMPLANT
CANISTER SUC SOCK COL 7IN (MISCELLANEOUS) ×1 IMPLANT
CANISTER SUCT 1200ML W/VALVE (MISCELLANEOUS) ×1 IMPLANT
CHLORAPREP W/TINT 26 (MISCELLANEOUS) ×1 IMPLANT
CLIP APPLIE 9.375 MED OPEN (MISCELLANEOUS) IMPLANT
COVER BACK TABLE 60X90IN (DRAPES) ×1 IMPLANT
COVER MAYO STAND STRL (DRAPES) ×1 IMPLANT
COVER PROBE CYLINDRICAL 5X96 (MISCELLANEOUS) ×1 IMPLANT
DERMABOND ADVANCED .7 DNX12 (GAUZE/BANDAGES/DRESSINGS) ×1 IMPLANT
DRAPE LAPAROSCOPIC ABDOMINAL (DRAPES) ×1 IMPLANT
DRAPE UTILITY XL STRL (DRAPES) ×1 IMPLANT
ELECT COATED BLADE 2.86 ST (ELECTRODE) ×1 IMPLANT
ELECTRODE REM PT RTRN 9FT ADLT (ELECTROSURGICAL) ×1 IMPLANT
GLOVE BIO SURGEON STRL SZ7.5 (GLOVE) ×2 IMPLANT
GOWN STRL REUS W/ TWL LRG LVL3 (GOWN DISPOSABLE) ×2 IMPLANT
KIT MARKER MARGIN INK (KITS) ×1 IMPLANT
NDL HYPO 25X1 1.5 SAFETY (NEEDLE) IMPLANT
NEEDLE HYPO 25X1 1.5 SAFETY (NEEDLE) IMPLANT
NS IRRIG 1000ML POUR BTL (IV SOLUTION) IMPLANT
PACK BASIN DAY SURGERY FS (CUSTOM PROCEDURE TRAY) ×1 IMPLANT
PENCIL SMOKE EVACUATOR (MISCELLANEOUS) ×1 IMPLANT
SLEEVE SCD COMPRESS KNEE MED (STOCKING) ×1 IMPLANT
SPIKE FLUID TRANSFER (MISCELLANEOUS) IMPLANT
SPONGE T-LAP 18X18 ~~LOC~~+RFID (SPONGE) ×1 IMPLANT
SUT MON AB 4-0 PC3 18 (SUTURE) ×1 IMPLANT
SUT SILK 2 0 SH (SUTURE) IMPLANT
SUT VICRYL 3-0 CR8 SH (SUTURE) ×1 IMPLANT
SYR CONTROL 10ML LL (SYRINGE) IMPLANT
TOWEL GREEN STERILE FF (TOWEL DISPOSABLE) ×1 IMPLANT
TRAY FAXITRON CT DISP (TRAY / TRAY PROCEDURE) ×1 IMPLANT
TUBE CONNECTING 20X1/4 (TUBING) ×1 IMPLANT
YANKAUER SUCT BULB TIP NO VENT (SUCTIONS) IMPLANT

## 2023-10-17 NOTE — Anesthesia Preprocedure Evaluation (Addendum)
 Anesthesia Evaluation  Patient identified by MRN, date of birth, ID band Patient awake    Reviewed: Allergy & Precautions, NPO status , Patient's Chart, lab work & pertinent test results  History of Anesthesia Complications Negative for: history of anesthetic complications  Airway Mallampati: III  TM Distance: >3 FB Neck ROM: Full    Dental  (+) Teeth Intact, Dental Advisory Given   Pulmonary neg shortness of breath, neg sleep apnea, neg COPD, neg recent URI   breath sounds clear to auscultation       Cardiovascular hypertension, Pt. on medications (-) angina (-) Past MI and (-) CHF + dysrhythmias Supra Ventricular Tachycardia  Rhythm:Regular  S/p ablation   Neuro/Psych negative neurological ROS  negative psych ROS   GI/Hepatic Neg liver ROS, hiatal hernia,GERD  Medicated and Controlled,,  Endo/Other  negative endocrine ROS    Renal/GU negative Renal ROS     Musculoskeletal  (+) Arthritis ,    Abdominal   Peds  Hematology negative hematology ROS (+) Lab Results      Component                Value               Date                      WBC                      8.6                 09/24/2023                HGB                      13.5                09/24/2023                HCT                      39.6                09/24/2023                MCV                      84.3                09/24/2023                PLT                      257                 09/24/2023              Anesthesia Other Findings   Reproductive/Obstetrics                             Anesthesia Physical Anesthesia Plan  ASA: 2  Anesthesia Plan: General   Post-op Pain Management: Tylenol  PO (pre-op)* and Gabapentin  PO (pre-op)*   Induction: Intravenous  PONV Risk Score and Plan: 4 or greater and Ondansetron , Dexamethasone , Propofol  infusion and TIVA  Airway Management Planned: LMA  Additional  Equipment: None  Intra-op Plan:   Post-operative Plan: Extubation in  OR  Informed Consent: I have reviewed the patients History and Physical, chart, labs and discussed the procedure including the risks, benefits and alternatives for the proposed anesthesia with the patient or authorized representative who has indicated his/her understanding and acceptance.     Dental advisory given  Plan Discussed with: CRNA  Anesthesia Plan Comments:        Anesthesia Quick Evaluation

## 2023-10-17 NOTE — Interval H&P Note (Signed)
 History and Physical Interval Note:  10/17/2023 9:26 AM  Toni [name]  has presented today for surgery, with the diagnosis of RIGHT BREAST DCIS.  The various methods of treatment have been discussed with the patient and family. After consideration of risks, benefits and other options for treatment, the patient has consented to  Procedure(s) with comments: BREAST LUMPECTOMY WITH RADIOACTIVE SEED LOCALIZATION (Right) - RIGHT BREAST RADIOACTIVE SEED LOCALIZED LUMPECTOMY as a surgical intervention.  The patient's history has been reviewed, patient examined, no change in status, stable for surgery.  I have reviewed the patient's chart and labs.  Questions were answered to the patient's satisfaction.     Lillette Reid III

## 2023-10-17 NOTE — Transfer of Care (Signed)
 Immediate Anesthesia Transfer of Care Note  Patient: Toni [name]  Procedure(s) Performed: Procedure(s) (LRB): BREAST LUMPECTOMY WITH RADIOACTIVE SEED LOCALIZATION (Right)  Patient Location: PACU  Anesthesia Type: General  Level of Consciousness: awake, oriented, sedated and patient cooperative  Airway & Oxygen Therapy: Patient Spontanous Breathing and Patient connected to face mask oxygen  Post-op Assessment: Report given to PACU RN and Post -op Vital signs reviewed and stable  Post vital signs: Reviewed and stable  Complications: No apparent anesthesia complications  Last Vitals:  Vitals Value Taken Time  BP 121/54 10/17/23 1054  Temp    Pulse 72 10/17/23 1056  Resp 25 10/17/23 1056  SpO2 98 % 10/17/23 1056  Vitals shown include unfiled device data.  Last Pain:  Vitals:   10/17/23 0756  TempSrc: Temporal  PainSc: 0-No pain         Complications: No notable events documented.

## 2023-10-17 NOTE — Op Note (Signed)
 10/17/2023  10:47 AM  PATIENT:  Toni [name]  78 y.o. female  PRE-OPERATIVE DIAGNOSIS:  RIGHT BREAST DCIS  POST-OPERATIVE DIAGNOSIS:  RIGHT BREAST DCIS  PROCEDURE:  Procedure(s) with comments: RIGHT BREAST LUMPECTOMY WITH RADIOACTIVE SEED LOCALIZATION (Right)   SURGEON:  Surgeons and Role:    Caralyn Chandler, MD - Primary  PHYSICIAN ASSISTANT:   ASSISTANTS: none   ANESTHESIA:   local and general  EBL:  5 mL   BLOOD ADMINISTERED:none  DRAINS: none   LOCAL MEDICATIONS USED:  MARCAINE      SPECIMEN:  Source of Specimen:  right breast tissue with additional superior and deep margins  DISPOSITION OF SPECIMEN:  PATHOLOGY  COUNTS:  YES  TOURNIQUET:  * No tourniquets in log *  DICTATION: .Dragon Dictation  After informed consent was obtained the patient was brought to the operating room and placed in the supine position on the operating table.  After adequate induction of general anesthesia the patient's right breast was prepped with ChloraPrep, allowed to dry, and draped in usual sterile manner.  An appropriate timeout was performed.  Previously an I-125 seed was placed in the upper outer quadrant of the right breast to mark an area of ductal carcinoma in situ.  The neoprobe was set to I-125 in the area of radioactivity was readily identified.  The area around this was infiltrated with quarter percent Marcaine .  A curvilinear incision was then made along the upper outer edge of the areola of the right breast with a 15 blade knife.  The incision was carried through the skin and subcutaneous tissue sharply with electrocautery.  Dissection was then carried out throughout the upper outer quadrant of the right breast between the breast tissue and the subcutaneous fat and skin.  Once this dissection was beyond the area of the radioactive seed I then removed a circular portion of breast tissue sharply with the electrocautery around the radioactive seed while checking the area of  radioactivity frequently.  Once the tissue was removed it was oriented with the appropriate paint colors.  A specimen radiograph was obtained that showed the clip and seed to be near the superior edge of the specimen.  I elected to take an additional superior and deep margin from this area and these were marked appropriately.  All of the tissue was then sent to pathology for further evaluation.  Hemostasis was achieved using the Bovie electrocautery.  The wound was then irrigated with saline and infiltrated with more quarter percent Marcaine .  The cavity was marked with clips.  The deep layer of the wound was then closed with layers of interrupted 3-0 Vicryl stitches.  The skin was closed with interrupted 4-0 Monocryl subcuticular stitches.  Dermabond dressings were applied.  The patient tolerated the procedure well.  At the end of the case all needle sponge and instrument counts were correct.  The patient was then awakened and taken to recovery in stable condition.  PLAN OF CARE: Discharge to home after PACU  PATIENT DISPOSITION:  PACU - hemodynamically stable.   Delay start of Pharmacological VTE agent (>24hrs) due to surgical blood loss or risk of bleeding: not applicable

## 2023-10-17 NOTE — Anesthesia Procedure Notes (Signed)
 Procedure Name: LMA Insertion Date/Time: 10/17/2023 10:58 AM  Performed by: Glo Larch, CRNAPre-anesthesia Checklist: Patient identified, Emergency Drugs available, Suction available and Patient being monitored Patient Re-evaluated:Patient Re-evaluated prior to induction Oxygen Delivery Method: Circle system utilized Preoxygenation: Pre-oxygenation with 100% oxygen Induction Type: IV induction Ventilation: Mask ventilation without difficulty LMA: LMA inserted LMA Size: 4.0 Number of attempts: 1 Airway Equipment and Method: Bite block Placement Confirmation: positive ETCO2 Tube secured with: Tape Dental Injury: Teeth and Oropharynx as per pre-operative assessment

## 2023-10-17 NOTE — Discharge Instructions (Addendum)
  Post Anesthesia Home Care Instructions  Activity: Get plenty of rest for the remainder of the day. A responsible individual must stay with you for 24 hours following the procedure.  For the next 24 hours, DO NOT: -Drive a car -Advertising copywriter -Drink alcoholic beverages -Take any medication unless instructed by your physician -Make any legal decisions or sign important papers.  Meals: Start with liquid foods such as gelatin or soup. Progress to regular foods as tolerated. Avoid greasy, spicy, heavy foods. If nausea and/or vomiting occur, drink only clear liquids until the nausea and/or vomiting subsides. Call your physician if vomiting continues.  Special Instructions/Symptoms: Your throat may feel dry or sore from the anesthesia or the breathing tube placed in your throat during surgery. If this causes discomfort, gargle with warm salt water. The discomfort should disappear within 24 hours.  Tylenol  can be taken after 2 pm if needed

## 2023-10-17 NOTE — H&P (Signed)
 REFERRING PHYSICIAN: [place], [place]* PROVIDER: Arlester Bence, MD MRN: [id number] DOB: Oct 02, 1945 Subjective    Chief Complaint: Breast Cancer  History of Present Illness: Toni [name] is a 78 y.o. female who is seen today as an office consultation for evaluation of Breast Cancer  We are asked to see the patient in consultation by Dr. Oma Bias to evaluate her for a new right breast cancer. The patient is a 78 year old white female who recently went for a routine screening mammogram. At that time she was found to have a 7 mm area of calcification in the upper outer quadrant of the right breast. 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 She may have a family history of ovarian cancer in a sister. She is otherwise in pretty good health and does not smoke.  Review of Systems: A complete review of systems was obtained from the patient. I have reviewed this information and discussed as appropriate with the patient. See HPI as well for other ROS.  ROS   Medical History: Past Medical History:  Diagnosis Date  Arthritis  GERD (gastroesophageal reflux disease)  Hypertension   Patient Active Problem List  Diagnosis  Aortic valve disorder  DOE (dyspnea on exertion)  Ductal carcinoma in situ (DCIS) of right breast  Elevated BP without diagnosis of hypertension  Esophageal reflux  SVT (supraventricular tachycardia) (CMS/HHS-HCC)  PVC's (premature ventricular contractions)  Other chest pain  Vaginal atrophy  Vertigo   Past Surgical History:  Procedure Laterality Date  SUPRAVENTRICULAR TACHYCARDIA ABLATION N/A 06/08/2013    Allergies  Allergen Reactions  Dexlansoprazole Other (See Comments)  REACTION: pressure in ears  dexlansoprazole  Lansoprazole Other (See Comments)  REACTION: pressure in ears  lansoprazole  Omeprazole Other (See Comments)  REACTION: pressure in ears   Current Outpatient Medications  on File Prior to Visit  Medication Sig Dispense Refill  amLODIPine  (NORVASC ) 2.5 MG tablet Take 1 tablet by mouth once daily  ascorbic acid, vitamin C, (VITAMIN C) 1000 MG tablet Take 1,000 mg by mouth once daily  cholecalciferol (VITAMIN D3) 2,000 unit capsule Take 2,000 Units by mouth once daily  pantoprazole  (PROTONIX ) 40 MG DR tablet Take 1 tablet by mouth once daily  tiZANidine  (ZANAFLEX ) 2 MG tablet Take 1 tablet by mouth at bedtime   No current facility-administered medications on file prior to visit.   Family History  Problem Relation Age of Onset  Hyperlipidemia (Elevated cholesterol) Mother  High blood pressure (Hypertension) Mother  Skin cancer Mother  Skin cancer Father  Coronary Artery Disease (Blocked arteries around heart) Sister  High blood pressure (Hypertension) Sister    Social History   Tobacco Use  Smoking Status Never  Smokeless Tobacco Never    Social History   Socioeconomic History  Marital status: Married  Tobacco Use  Smoking status: Never  Smokeless tobacco: Never  Vaping Use  Vaping status: Never Used  Substance and Sexual Activity  Alcohol use: Yes  Comment: socially  Drug use: Never   Social Drivers of Corporate investment banker Strain: Low Risk (09/10/2023)  Received from [facility]  Overall Financial Resource Strain (CARDIA)  Difficulty of Paying Living Expenses: Not hard at all  Food Insecurity: No Food Insecurity (09/10/2023)  Received from [facility]  Hunger Vital Sign  Worried About Running Out of Food in the Last Year: Never true  Ran Out of Food in the Last Year: Never true  Transportation Needs: No  Transportation Needs (09/10/2023)  Received from [facility] - Transportation  Lack of Transportation (Medical): No  Lack of Transportation (Non-Medical): No  Physical Activity: Insufficiently Active (09/10/2023)  Received from [facility]  Exercise Vital Sign  Days of Exercise per Week: 3 days  Minutes of Exercise  per Session: 20 min  Stress: No Stress Concern Present (09/10/2023)  Received from [facility] - Occupational Stress Questionnaire  Feeling of Stress : Not at all  Social Connections: Unknown (09/10/2023)  Received from [facility]  Social Connection and Isolation Panel [NHANES]  Frequency of Communication with Friends and Family: Once a week  Frequency of Social Gatherings with Friends and Family: Patient declined  Attends Religious Services: More than 4 times per year  Active Member of [employer] or Organizations: No  Marital Status: Married  Housing Stability: Low Risk (09/10/2023)  Received from [facility]  Housing Stability Vital Sign  Unable to Pay for Housing in the Last Year: No  Number of Times Moved in the Last Year: 0  Homeless in the Last Year: No   Objective:  There were no vitals filed for this visit.  There is no height or weight on file to calculate BMI.  Physical Exam Vitals reviewed.  Constitutional:  General: She is not in acute distress. Appearance: Normal appearance.  HENT:  Head: Normocephalic and atraumatic.  Right Ear: External ear normal.  Left Ear: External ear normal.  Nose: Nose normal.  Mouth/Throat:  Mouth: Mucous membranes are moist.  Pharynx: Oropharynx is clear.  Eyes:  General: No scleral icterus. Extraocular Movements: Extraocular movements intact.  Conjunctiva/sclera: Conjunctivae normal.  Pupils: Pupils are equal, round, and reactive to light.  Cardiovascular:  Rate and Rhythm: Normal rate and regular rhythm.  Pulses: Normal pulses.  Heart sounds: Normal heart sounds.  Pulmonary:  Effort: Pulmonary effort is normal. No respiratory distress.  Breath sounds: Normal breath sounds.  Abdominal:  General: Bowel sounds are normal.  Palpations: Abdomen is soft.  Tenderness: There is no abdominal tenderness.  Musculoskeletal:  General: No swelling, tenderness or deformity. Normal range of motion.   Cervical back: Normal range of motion and neck supple.  Skin: General: Skin is warm and dry.  Coloration: Skin is not jaundiced.  Neurological:  General: No focal deficit present.  Mental Status: She is alert and oriented to person, place, and time.  Psychiatric:  Mood and Affect: Mood normal.  Behavior: Behavior normal.     Breast: There is no palpable mass in either breast. There is no palpable axillary, supraclavicular, or cervical lymphadenopathy.  Labs, Imaging and Diagnostic Testing:  Assessment and Plan:   Diagnoses and all orders for this visit:  Ductal carcinoma in situ (DCIS) of right breast - [facility] Case Posting Request; Future   The patient appears to have a 7 mm area of ductal carcinoma in situ in the upper outer quadrant of the right breast. I have discussed with her in detail the different options for treatment and at this point she favors breast conservation which I feel is very reasonable. She will not need a node evaluation. I have discussed with her in detail the risks and benefits of the operation as well as some of the technical aspects including use of a radioactive seed for localization and she understands and wishes to proceed. She will meet with medical and radiation oncology to discuss adjuvant therapy. We will move forward with surgical scheduling.

## 2023-10-18 NOTE — Anesthesia Postprocedure Evaluation (Signed)
 Anesthesia Post Note  Patient: Toni [name]  Procedure(s) Performed: BREAST LUMPECTOMY WITH RADIOACTIVE SEED LOCALIZATION (Right: Breast)     Patient location during evaluation: PACU Anesthesia Type: General Level of consciousness: awake and alert Pain management: pain level controlled Vital Signs Assessment: post-procedure vital signs reviewed and stable Respiratory status: spontaneous breathing, nonlabored ventilation and respiratory function stable Cardiovascular status: blood pressure returned to baseline and stable Postop Assessment: no apparent nausea or vomiting Anesthetic complications: no   No notable events documented.  Last Vitals:  Vitals:   10/17/23 1115 10/17/23 1139  BP:  138/65  Pulse: 64 66  Resp: 15 18  Temp:  (!) 36.1 C  SpO2: 95% 99%    Last Pain:  Vitals:   10/17/23 1139  TempSrc:   PainSc: 0-No pain                 Murad Staples

## 2023-10-19 ENCOUNTER — Encounter (HOSPITAL_BASED_OUTPATIENT_CLINIC_OR_DEPARTMENT_OTHER): Payer: Self-pay | Admitting: General Surgery

## 2023-10-22 ENCOUNTER — Encounter: Payer: Self-pay | Admitting: *Deleted

## 2023-10-22 DIAGNOSIS — D485 Neoplasm of uncertain behavior of skin: Secondary | ICD-10-CM | POA: Diagnosis not present

## 2023-10-22 DIAGNOSIS — H0279 Other degenerative disorders of eyelid and periocular area: Secondary | ICD-10-CM | POA: Diagnosis not present

## 2023-10-22 DIAGNOSIS — L720 Epidermal cyst: Secondary | ICD-10-CM | POA: Diagnosis not present

## 2023-10-22 DIAGNOSIS — Z01818 Encounter for other preprocedural examination: Secondary | ICD-10-CM | POA: Diagnosis not present

## 2023-10-23 ENCOUNTER — Ambulatory Visit: Payer: Self-pay | Admitting: General Surgery

## 2023-10-23 LAB — SURGICAL PATHOLOGY

## 2023-11-05 ENCOUNTER — Encounter: Payer: Self-pay | Admitting: Obstetrics and Gynecology

## 2023-11-07 NOTE — Progress Notes (Signed)
 Location of Breast Cancer: Left breast  Histology per Pathology Report: FINAL MICROSCOPIC DIAGNOSIS:   A. BREAST, RIGHT, LUMPECTOMY:  - Two foci of microinvasive mammary carcinoma arising in a background of  ductal carcinoma in situ (DCIS).  - See cancer summary below.  - Prior biopsy site with associated clip.  - Radioactive seed.   B. BREAST, RIGHT SUPERIOR MARGIN, EXCISION:  - Benign breast tissue with sclerosing adenosis, apocrine metaplasia,  columnar cell change, and usual ductal hyperplasia.  - Negative for atypia and malignancy.  - See cancer summary below.   C. BREAST, RIGHT DEEP MARGIN, EXCISION:  - Benign breast tissue.  - Negative for atypia and malignancy.   Receptor Status: ER(0), PR (0), Her2-neu (), Ki-()  Did patient present with symptoms (if so, please note symptoms) or was this found on screening mammography?: Screening mammogram detected left breast calcifications UOQ 0.7 cm stereotactic biopsy: High-grade DCIS with calcifications necrosis ER 0%, PR 0%   Past/Anticipated interventions by surgeon, if any: Dr. Alethea Andes 10/17/2023 BREAST LUMPECTOMY WITH RADIOACTIVE SEED LOCALIZATION   Past/Anticipated interventions by medical oncology, if any: Assessment & Plan Ductal carcinoma in situ (DCIS) of breast DCIS identified via mammogram, 7 mm, grade 3, 20% risk of invasive transformation if untreated. ER/PR negative, stage 0, non-invasive. - Proceed with lumpectomy to remove DCIS. - Review final pathology post-surgery to confirm absence of invasive cancer. - Consider radiation therapy post-surgery if pathology confirms DCIS only. - Discuss further treatment options if invasive cancer is detected post-surgery.  Lymphedema issues, if any:  ***  Pain issues, if any:  ***  Skin issues if any ***  SAFETY ISSUES: Prior radiation? {:18581} Pacemaker/ICD? {:18581} Possible current pregnancy? No- Postmenopausal  Is the patient on methotrexate? {:18581}  Current Complaints  / other details:  ***    This concludes the interaction.  Avery Bodo, LPN

## 2023-11-10 NOTE — Progress Notes (Signed)
 Radiation Oncology         [phone] ________________________________  Outpatient Follow Up - Conducted via telephone at patient request.  I spoke with the patient to conduct this visit via telephone. The patient was notified in advance and was offered an in person or telemedicine meeting to allow for face to face communication but instead preferred to proceed with a telephone visit.  Name: Toni [name]        MRN: [id number]  Date of Service: 11/13/2023 DOB: 1945/09/06  WU:[id number], Beckey Bourgeois, MD  Cameron Cea, MD     REFERRING PHYSICIAN: Cameron Cea, MD   DIAGNOSIS: The encounter diagnosis was Ductal carcinoma in situ (DCIS) of right breast.   HISTORY OF PRESENT ILLNESS: Toni [name] is a 78 y.o. female originally seen in the multidisciplinary breast clinic for a new diagnosis of right breast cancer. The patient was noted to have an asymmetry on screening mammogram. She proceeded with diagnostic mammogram on 08/22/2023 that showed a 0.7 cm group of calcifications in the right breast. Right axilla ultrasound was not indicated. Accordingly, patient underwent a biopsy on 09/12/2023 that revealed grade 3 ductal carcinoma in situ that was ER and PR negative.  Since her last visit, she underwent a right lumpectomy on 10/17/2023 with Dr. Alethea Andes.  This revealed 2 foci of microinvasive mammary carcinoma arising in a background of high-grade DCIS, and her margins were negative.  There was insufficient tissue to test prognostics for the invasive component, but her DCIS was originally ER/PR negative. She's contacted by phone to discuss adjuvant radiotherapy.        PREVIOUS RADIATION THERAPY: No   PAST MEDICAL HISTORY:  Past Medical History:  Diagnosis Date   Aortic sclerosis 2000   on ECHO   Arthritis    knees   Breast cancer (HCC)    Colon polyps    Dr Adan Holms   Diverticulosis of colon (without mention of hemorrhage)    Esophageal reflux    Eye abnormality    OD ocular  freckle   H/O hiatal hernia    Hyperlipidemia 1999   LDL 158, resolved with diet   Hypertension    Osteopenia after menopause 2024   osteopenia with positive frax. LFN -2.3, frax 4.3% PMD managing considering medication   Premature ventricular contractions    Stricture and stenosis of esophagus    dilation X 3   SVT (supraventricular tachycardia) (HCC)    slow pathway ablated       PAST SURGICAL HISTORY: Past Surgical History:  Procedure Laterality Date   ANKLE SURGERY     right   BREAST BIOPSY Right 09/12/2023   MM RT BREAST BX W LOC DEV 1ST LESION IMAGE BX SPEC STEREO GUIDE 09/12/2023 [facility]   BREAST BIOPSY  10/14/2023   MM RT RADIOACTIVE SEED LOC MAMMO GUIDE 10/14/2023 [facility]   BREAST LUMPECTOMY WITH RADIOACTIVE SEED LOCALIZATION Right 10/17/2023   Procedure: BREAST LUMPECTOMY WITH RADIOACTIVE SEED LOCALIZATION;  Surgeon: Caralyn Chandler, MD;  Location: [facility];  Service: General;  Laterality: Right;  RIGHT BREAST RADIOACTIVE SEED LOCALIZED LUMPECTOMY   COLONOSCOPY  10/14/2014   pyrtle   COLONOSCOPY W/ POLYPECTOMY  2011   Dr Adan Holms   KNEE ARTHROSCOPY     bilateral   ovarian wedge resection     to allow pregnancy   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   SUPRAVENTRICULAR TACHYCARDIA ABLATION N/A 06/08/2013   Procedure: SUPRAVENTRICULAR TACHYCARDIA ABLATION;  Surgeon: Ellaree Gunther, MD;  Location: [facility];  Service: Cardiovascular;  Laterality: N/A;   TONSILLECTOMY     UPPER GASTROINTESTINAL ENDOSCOPY     [facility] yearly   wisdom teeth ext       FAMILY HISTORY:  Family History  Problem Relation Age of Onset   Arthritis Mother        OA   Hypertension Mother    Hypothyroidism Mother    Stroke Mother 46       ischemic   Stroke Father 9   Heart attack Sister 62       smoker   Ovarian cancer Sister        dx 23s   Osteopenia Sister    Hypertrophic cardiomyopathy Sister    COPD Sister    Heart failure  Sister    Hypothyroidism Sister    Cardiomyopathy Sister    Emphysema Sister    Diabetes Maternal Grandmother    Colon cancer Neg Hx    Esophageal cancer Neg Hx    Liver disease Neg Hx    Stomach cancer Neg Hx    Rectal cancer Neg Hx      SOCIAL HISTORY:  reports that she has never smoked. She has never used smokeless tobacco. She reports that she does not currently use alcohol. She reports that she does not use drugs. The patient is married and lives in [place]. She enjoys swimming and hopes to get back to this activity soon.   ALLERGIES: Dexilant [dexlansoprazole], Prevacid [lansoprazole], and Prilosec [omeprazole]   MEDICATIONS:  Current Outpatient Medications  Medication Sig Dispense Refill   amLODipine  (NORVASC ) 2.5 MG tablet Take 1 tablet (2.5 mg total) by mouth daily. 90 tablet 3   Cholecalciferol (VITAMIN D) 50 MCG (2000 UT) CAPS Take by mouth.     pantoprazole  (PROTONIX ) 40 MG tablet Take 40 mg by mouth daily.     No current facility-administered medications for this encounter.     REVIEW OF SYSTEMS: On review of systems, the patient reports that she is doing well overall. She's had some soreness of the prior biopsy site. No other complaints are verbalized but she is hoping to start swimming soon as this is her favorite type of exercise.     PHYSICAL EXAM:  Wt Readings from Last 3 Encounters:  10/17/23 172 lb 6.4 oz (78.2 kg)  09/24/23 175 lb 11.2 oz (79.7 kg)  09/11/23 174 lb (78.9 kg)   Temp Readings from Last 3 Encounters:  10/17/23 (!) 97 F (36.1 C)  09/24/23 98.3 F (36.8 C) (Temporal)  03/04/23 98.6 F (37 C) (Oral)   BP Readings from Last 3 Encounters:  10/17/23 138/65  09/24/23 (!) 166/68  04/28/23 136/84   Pulse Readings from Last 3 Encounters:  10/17/23 66  09/24/23 86  04/28/23 77    In general this is a well appearing female in no acute distress. She's alert and oriented x4 and appropriate throughout the examination. Cardiopulmonary  assessment is negative for acute distress and she exhibits normal effort. The right breast incision site is well healed without erythema, separation, or drainage.     ECOG = 0  0 - Asymptomatic (Fully active, able to carry on all predisease activities without restriction)  1 - Symptomatic but completely ambulatory (Restricted in physically strenuous activity but ambulatory and able to carry out work of a light or sedentary nature. For example, light housework, office work)  2 - Symptomatic, <50% in bed during the day (Ambulatory and capable of all self  care but unable to carry out any work activities. Up and about more than 50% of waking hours)  3 - Symptomatic, >50% in bed, but not bedbound (Capable of only limited self-care, confined to bed or chair 50% or more of waking hours)  4 - Bedbound (Completely disabled. Cannot carry on any self-care. Totally confined to bed or chair)  5 - Death   Aurea Blossom MM, Creech RH, Tormey DC, et al. [phone]). Toxicity and response criteria of the [facility]. Am. Hillard Lowes. Oncol. 5 (6): 649-55    LABORATORY DATA:  Lab Results  Component Value Date   WBC 8.6 09/24/2023   HGB 13.5 09/24/2023   HCT 39.6 09/24/2023   MCV 84.3 09/24/2023   PLT 257 09/24/2023   Lab Results  Component Value Date   NA 141 09/24/2023   K 3.8 09/24/2023   CL 109 09/24/2023   CO2 26 09/24/2023   Lab Results  Component Value Date   ALT 15 09/24/2023   AST 11 (L) 09/24/2023   ALKPHOS 79 09/24/2023   BILITOT 0.5 09/24/2023      RADIOGRAPHY: MM Breast Surgical Specimen Result Date: 10/17/2023 CLINICAL DATA:  Patient is post radioactive seed localization subsequent surgical excision right breast. EXAM: SPECIMEN RADIOGRAPH OF THE RIGHT BREAST COMPARISON:  Previous exam(s). FINDINGS: Status post excision of the right breast. The radioactive seed and biopsy marker clip are present, completely intact, and were marked for pathology. Results called to the  OR at the time of dictation. IMPRESSION: Specimen radiograph of the right breast. Electronically Signed   By: Roda Cirri M.D.   On: 10/17/2023 10:34       IMPRESSION/PLAN: 1. pT1micNxMx, unknown grade or prognostics following an initial diagnosis of high grade, ER/PR negative DCIS of the right breast. Dr. Jeryl Moris has reviewed her surgical results and course to date. She has done well surgically, and Dr. Jeryl Moris would recommend external beam radiotherapy to the breast to reduce risks of local recurrence. We discussed the risks, benefits, short, and long term effects of radiotherapy, as well as the curative intent, and the patient is interested in proceeding. I reviewed the delivery and logistics of radiotherapy and that Dr. Jeryl Moris recommends 4 weeks of radiotherapy to the right breast. She will simulate tomorrow and sign written consent to proceed.  I will also see if the patient needs any additional follow up with Dr. Gudena.   This encounter was conducted via telephone.  The patient has provided two factor identification and has given verbal consent for this type of encounter and has been advised to only accept a meeting of this type in a secure network environment. The time spent during this encounter was 45 minutes including preparation, discussion, and coordination of the patient's care. The attendants for this meeting include Kee Pastel, RN, Bettejane Brownie, and Jonelle Neri Quiocho.  During the encounter,  Kee Pastel, RN,  Bettejane Brownie were located at [facility] Radiation Oncology Department.  Estefania Jean Ropp was located at home.     Shelvia Dick, [facility]    **Disclaimer: This note was dictated with voice recognition software. Similar sounding words can inadvertently be transcribed and this note may contain transcription errors which may not have been corrected upon publication of note.**

## 2023-11-13 ENCOUNTER — Ambulatory Visit
Admission: RE | Admit: 2023-11-13 | Discharge: 2023-11-13 | Disposition: A | Discharge status: Home / Self Care | Source: Ambulatory Visit | Attending: Radiation Oncology | Admitting: Radiation Oncology

## 2023-11-13 DIAGNOSIS — D0511 Intraductal carcinoma in situ of right breast: Secondary | ICD-10-CM | POA: Insufficient documentation

## 2023-11-13 DIAGNOSIS — I10 Essential (primary) hypertension: Secondary | ICD-10-CM | POA: Insufficient documentation

## 2023-11-13 DIAGNOSIS — Z8041 Family history of malignant neoplasm of ovary: Secondary | ICD-10-CM | POA: Diagnosis not present

## 2023-11-13 DIAGNOSIS — Z171 Estrogen receptor negative status [ER-]: Secondary | ICD-10-CM | POA: Insufficient documentation

## 2023-11-13 DIAGNOSIS — M1990 Unspecified osteoarthritis, unspecified site: Secondary | ICD-10-CM | POA: Insufficient documentation

## 2023-11-13 DIAGNOSIS — M8580 Other specified disorders of bone density and structure, unspecified site: Secondary | ICD-10-CM | POA: Diagnosis not present

## 2023-11-13 DIAGNOSIS — I700 Atherosclerosis of aorta: Secondary | ICD-10-CM | POA: Diagnosis not present

## 2023-11-13 DIAGNOSIS — Z79899 Other long term (current) drug therapy: Secondary | ICD-10-CM | POA: Insufficient documentation

## 2023-11-13 DIAGNOSIS — E785 Hyperlipidemia, unspecified: Secondary | ICD-10-CM | POA: Diagnosis not present

## 2023-11-14 ENCOUNTER — Ambulatory Visit
Admission: RE | Admit: 2023-11-14 | Discharge: 2023-11-14 | Disposition: A | Discharge status: Home / Self Care | Source: Ambulatory Visit | Attending: Radiation Oncology | Admitting: Radiation Oncology

## 2023-11-14 DIAGNOSIS — D0511 Intraductal carcinoma in situ of right breast: Secondary | ICD-10-CM | POA: Diagnosis not present

## 2023-11-14 DIAGNOSIS — Z510 Encounter for antineoplastic radiation therapy: Secondary | ICD-10-CM | POA: Diagnosis not present

## 2023-11-14 DIAGNOSIS — Z171 Estrogen receptor negative status [ER-]: Secondary | ICD-10-CM | POA: Diagnosis not present

## 2023-11-18 DIAGNOSIS — D0511 Intraductal carcinoma in situ of right breast: Secondary | ICD-10-CM | POA: Diagnosis not present

## 2023-11-18 DIAGNOSIS — Z510 Encounter for antineoplastic radiation therapy: Secondary | ICD-10-CM | POA: Diagnosis not present

## 2023-11-18 DIAGNOSIS — Z171 Estrogen receptor negative status [ER-]: Secondary | ICD-10-CM | POA: Diagnosis not present

## 2023-11-21 ENCOUNTER — Encounter: Payer: Self-pay | Admitting: *Deleted

## 2023-11-21 DIAGNOSIS — D0511 Intraductal carcinoma in situ of right breast: Secondary | ICD-10-CM

## 2023-11-26 ENCOUNTER — Ambulatory Visit
Admission: RE | Admit: 2023-11-26 | Discharge: 2023-11-26 | Disposition: A | Discharge status: Home / Self Care | Source: Ambulatory Visit | Attending: Radiation Oncology | Admitting: Radiation Oncology

## 2023-11-26 ENCOUNTER — Other Ambulatory Visit: Payer: Self-pay

## 2023-11-26 DIAGNOSIS — D0511 Intraductal carcinoma in situ of right breast: Secondary | ICD-10-CM | POA: Diagnosis not present

## 2023-11-26 DIAGNOSIS — Z171 Estrogen receptor negative status [ER-]: Secondary | ICD-10-CM | POA: Diagnosis not present

## 2023-11-26 DIAGNOSIS — Z510 Encounter for antineoplastic radiation therapy: Secondary | ICD-10-CM | POA: Insufficient documentation

## 2023-11-26 LAB — RAD ONC ARIA SESSION SUMMARY
Course Elapsed Days: 0
Plan Fractions Treated to Date: 1
Plan Prescribed Dose Per Fraction: 2.66 Gy
Plan Total Fractions Prescribed: 16
Plan Total Prescribed Dose: 42.56 Gy
Reference Point Dosage Given to Date: 2.66 Gy
Reference Point Session Dosage Given: 2.66 Gy
Session Number: 1

## 2023-11-27 ENCOUNTER — Other Ambulatory Visit: Payer: Self-pay

## 2023-11-27 ENCOUNTER — Ambulatory Visit
Admission: RE | Admit: 2023-11-27 | Discharge: 2023-11-27 | Disposition: A | Discharge status: Home / Self Care | Source: Ambulatory Visit | Attending: Radiation Oncology | Admitting: Radiation Oncology

## 2023-11-27 DIAGNOSIS — D0511 Intraductal carcinoma in situ of right breast: Secondary | ICD-10-CM | POA: Diagnosis not present

## 2023-11-27 DIAGNOSIS — Z510 Encounter for antineoplastic radiation therapy: Secondary | ICD-10-CM | POA: Diagnosis not present

## 2023-11-27 LAB — RAD ONC ARIA SESSION SUMMARY
Course Elapsed Days: 1
Plan Fractions Treated to Date: 2
Plan Prescribed Dose Per Fraction: 2.66 Gy
Plan Total Fractions Prescribed: 16
Plan Total Prescribed Dose: 42.56 Gy
Reference Point Dosage Given to Date: 5.32 Gy
Reference Point Session Dosage Given: 2.66 Gy
Session Number: 2

## 2023-12-01 ENCOUNTER — Ambulatory Visit
Admission: RE | Admit: 2023-12-01 | Discharge: 2023-12-01 | Disposition: A | Discharge status: Home / Self Care | Source: Ambulatory Visit | Attending: Radiation Oncology | Admitting: Radiation Oncology

## 2023-12-01 ENCOUNTER — Other Ambulatory Visit: Payer: Self-pay

## 2023-12-01 DIAGNOSIS — Z510 Encounter for antineoplastic radiation therapy: Secondary | ICD-10-CM | POA: Diagnosis not present

## 2023-12-01 DIAGNOSIS — Z171 Estrogen receptor negative status [ER-]: Secondary | ICD-10-CM | POA: Diagnosis not present

## 2023-12-01 DIAGNOSIS — D0511 Intraductal carcinoma in situ of right breast: Secondary | ICD-10-CM | POA: Diagnosis not present

## 2023-12-01 LAB — RAD ONC ARIA SESSION SUMMARY
Course Elapsed Days: 5
Plan Fractions Treated to Date: 3
Plan Prescribed Dose Per Fraction: 2.66 Gy
Plan Total Fractions Prescribed: 16
Plan Total Prescribed Dose: 42.56 Gy
Reference Point Dosage Given to Date: 7.98 Gy
Reference Point Session Dosage Given: 2.66 Gy
Session Number: 3

## 2023-12-02 ENCOUNTER — Ambulatory Visit
Admission: RE | Admit: 2023-12-02 | Discharge: 2023-12-02 | Disposition: A | Discharge status: Home / Self Care | Source: Ambulatory Visit | Attending: Radiation Oncology

## 2023-12-02 ENCOUNTER — Ambulatory Visit: Admitting: Radiation Oncology

## 2023-12-02 ENCOUNTER — Other Ambulatory Visit: Payer: Self-pay

## 2023-12-02 DIAGNOSIS — Z510 Encounter for antineoplastic radiation therapy: Secondary | ICD-10-CM | POA: Diagnosis not present

## 2023-12-02 DIAGNOSIS — D0511 Intraductal carcinoma in situ of right breast: Secondary | ICD-10-CM | POA: Diagnosis not present

## 2023-12-02 DIAGNOSIS — Z171 Estrogen receptor negative status [ER-]: Secondary | ICD-10-CM | POA: Diagnosis not present

## 2023-12-02 LAB — RAD ONC ARIA SESSION SUMMARY
Course Elapsed Days: 6
Plan Fractions Treated to Date: 4
Plan Prescribed Dose Per Fraction: 2.66 Gy
Plan Total Fractions Prescribed: 16
Plan Total Prescribed Dose: 42.56 Gy
Reference Point Dosage Given to Date: 10.64 Gy
Reference Point Session Dosage Given: 2.66 Gy
Session Number: 4

## 2023-12-03 ENCOUNTER — Other Ambulatory Visit: Payer: Self-pay

## 2023-12-03 ENCOUNTER — Ambulatory Visit
Admission: RE | Admit: 2023-12-03 | Discharge: 2023-12-03 | Disposition: A | Discharge status: Home / Self Care | Source: Ambulatory Visit | Attending: Radiation Oncology

## 2023-12-03 DIAGNOSIS — Z510 Encounter for antineoplastic radiation therapy: Secondary | ICD-10-CM | POA: Diagnosis not present

## 2023-12-03 DIAGNOSIS — Z171 Estrogen receptor negative status [ER-]: Secondary | ICD-10-CM | POA: Diagnosis not present

## 2023-12-03 DIAGNOSIS — D0511 Intraductal carcinoma in situ of right breast: Secondary | ICD-10-CM | POA: Diagnosis not present

## 2023-12-03 LAB — RAD ONC ARIA SESSION SUMMARY
Course Elapsed Days: 7
Plan Fractions Treated to Date: 5
Plan Prescribed Dose Per Fraction: 2.66 Gy
Plan Total Fractions Prescribed: 16
Plan Total Prescribed Dose: 42.56 Gy
Reference Point Dosage Given to Date: 13.3 Gy
Reference Point Session Dosage Given: 2.66 Gy
Session Number: 5

## 2023-12-04 ENCOUNTER — Other Ambulatory Visit: Payer: Self-pay

## 2023-12-04 ENCOUNTER — Ambulatory Visit
Admission: RE | Admit: 2023-12-04 | Discharge: 2023-12-04 | Disposition: A | Discharge status: Home / Self Care | Source: Ambulatory Visit | Attending: Radiation Oncology | Admitting: Radiation Oncology

## 2023-12-04 DIAGNOSIS — D0511 Intraductal carcinoma in situ of right breast: Secondary | ICD-10-CM | POA: Diagnosis not present

## 2023-12-04 DIAGNOSIS — Z510 Encounter for antineoplastic radiation therapy: Secondary | ICD-10-CM | POA: Diagnosis not present

## 2023-12-04 DIAGNOSIS — Z171 Estrogen receptor negative status [ER-]: Secondary | ICD-10-CM | POA: Diagnosis not present

## 2023-12-04 LAB — RAD ONC ARIA SESSION SUMMARY
Course Elapsed Days: 8
Plan Fractions Treated to Date: 6
Plan Prescribed Dose Per Fraction: 2.66 Gy
Plan Total Fractions Prescribed: 16
Plan Total Prescribed Dose: 42.56 Gy
Reference Point Dosage Given to Date: 15.96 Gy
Reference Point Session Dosage Given: 2.66 Gy
Session Number: 6

## 2023-12-05 ENCOUNTER — Ambulatory Visit
Admission: RE | Admit: 2023-12-05 | Discharge: 2023-12-05 | Disposition: A | Discharge status: Home / Self Care | Source: Ambulatory Visit | Attending: Radiation Oncology

## 2023-12-05 ENCOUNTER — Other Ambulatory Visit: Payer: Self-pay

## 2023-12-05 DIAGNOSIS — D0511 Intraductal carcinoma in situ of right breast: Secondary | ICD-10-CM | POA: Diagnosis not present

## 2023-12-05 DIAGNOSIS — Z510 Encounter for antineoplastic radiation therapy: Secondary | ICD-10-CM | POA: Diagnosis not present

## 2023-12-05 DIAGNOSIS — Z171 Estrogen receptor negative status [ER-]: Secondary | ICD-10-CM | POA: Diagnosis not present

## 2023-12-05 LAB — RAD ONC ARIA SESSION SUMMARY
Course Elapsed Days: 9
Plan Fractions Treated to Date: 7
Plan Prescribed Dose Per Fraction: 2.66 Gy
Plan Total Fractions Prescribed: 16
Plan Total Prescribed Dose: 42.56 Gy
Reference Point Dosage Given to Date: 18.62 Gy
Reference Point Session Dosage Given: 2.66 Gy
Session Number: 7

## 2023-12-08 ENCOUNTER — Other Ambulatory Visit: Payer: Self-pay

## 2023-12-08 ENCOUNTER — Ambulatory Visit
Admission: RE | Admit: 2023-12-08 | Discharge: 2023-12-08 | Disposition: A | Discharge status: Home / Self Care | Source: Ambulatory Visit | Attending: Radiation Oncology

## 2023-12-08 DIAGNOSIS — Z171 Estrogen receptor negative status [ER-]: Secondary | ICD-10-CM | POA: Diagnosis not present

## 2023-12-08 DIAGNOSIS — Z510 Encounter for antineoplastic radiation therapy: Secondary | ICD-10-CM | POA: Diagnosis not present

## 2023-12-08 DIAGNOSIS — D0511 Intraductal carcinoma in situ of right breast: Secondary | ICD-10-CM | POA: Diagnosis not present

## 2023-12-08 LAB — RAD ONC ARIA SESSION SUMMARY
Course Elapsed Days: 12
Plan Fractions Treated to Date: 8
Plan Prescribed Dose Per Fraction: 2.66 Gy
Plan Total Fractions Prescribed: 16
Plan Total Prescribed Dose: 42.56 Gy
Reference Point Dosage Given to Date: 21.28 Gy
Reference Point Session Dosage Given: 2.66 Gy
Session Number: 8

## 2023-12-09 ENCOUNTER — Other Ambulatory Visit: Payer: Self-pay

## 2023-12-09 ENCOUNTER — Ambulatory Visit
Admission: RE | Admit: 2023-12-09 | Discharge: 2023-12-09 | Disposition: A | Discharge status: Home / Self Care | Source: Ambulatory Visit | Attending: Radiation Oncology

## 2023-12-09 DIAGNOSIS — D0511 Intraductal carcinoma in situ of right breast: Secondary | ICD-10-CM | POA: Diagnosis not present

## 2023-12-09 DIAGNOSIS — Z171 Estrogen receptor negative status [ER-]: Secondary | ICD-10-CM | POA: Diagnosis not present

## 2023-12-09 DIAGNOSIS — Z510 Encounter for antineoplastic radiation therapy: Secondary | ICD-10-CM | POA: Diagnosis not present

## 2023-12-09 LAB — RAD ONC ARIA SESSION SUMMARY
Course Elapsed Days: 13
Plan Fractions Treated to Date: 9
Plan Prescribed Dose Per Fraction: 2.66 Gy
Plan Total Fractions Prescribed: 16
Plan Total Prescribed Dose: 42.56 Gy
Reference Point Dosage Given to Date: 23.94 Gy
Reference Point Session Dosage Given: 2.66 Gy
Session Number: 9

## 2023-12-10 ENCOUNTER — Other Ambulatory Visit: Payer: Self-pay

## 2023-12-10 ENCOUNTER — Ambulatory Visit
Admission: RE | Admit: 2023-12-10 | Discharge: 2023-12-10 | Disposition: A | Discharge status: Home / Self Care | Source: Ambulatory Visit | Attending: Radiation Oncology

## 2023-12-10 DIAGNOSIS — D0511 Intraductal carcinoma in situ of right breast: Secondary | ICD-10-CM | POA: Diagnosis not present

## 2023-12-10 DIAGNOSIS — Z171 Estrogen receptor negative status [ER-]: Secondary | ICD-10-CM | POA: Diagnosis not present

## 2023-12-10 DIAGNOSIS — Z510 Encounter for antineoplastic radiation therapy: Secondary | ICD-10-CM | POA: Diagnosis not present

## 2023-12-10 LAB — RAD ONC ARIA SESSION SUMMARY
Course Elapsed Days: 14
Plan Fractions Treated to Date: 10
Plan Prescribed Dose Per Fraction: 2.66 Gy
Plan Total Fractions Prescribed: 16
Plan Total Prescribed Dose: 42.56 Gy
Reference Point Dosage Given to Date: 26.6 Gy
Reference Point Session Dosage Given: 2.66 Gy
Session Number: 10

## 2023-12-11 ENCOUNTER — Other Ambulatory Visit: Payer: Self-pay

## 2023-12-11 ENCOUNTER — Ambulatory Visit
Admission: RE | Admit: 2023-12-11 | Discharge: 2023-12-11 | Disposition: A | Discharge status: Home / Self Care | Source: Ambulatory Visit | Attending: Radiation Oncology | Admitting: Radiation Oncology

## 2023-12-11 DIAGNOSIS — Z171 Estrogen receptor negative status [ER-]: Secondary | ICD-10-CM | POA: Diagnosis not present

## 2023-12-11 DIAGNOSIS — D0511 Intraductal carcinoma in situ of right breast: Secondary | ICD-10-CM | POA: Diagnosis not present

## 2023-12-11 DIAGNOSIS — Z510 Encounter for antineoplastic radiation therapy: Secondary | ICD-10-CM | POA: Diagnosis not present

## 2023-12-11 LAB — RAD ONC ARIA SESSION SUMMARY
Course Elapsed Days: 15
Plan Fractions Treated to Date: 11
Plan Prescribed Dose Per Fraction: 2.66 Gy
Plan Total Fractions Prescribed: 16
Plan Total Prescribed Dose: 42.56 Gy
Reference Point Dosage Given to Date: 29.26 Gy
Reference Point Session Dosage Given: 2.66 Gy
Session Number: 11

## 2023-12-12 ENCOUNTER — Ambulatory Visit: Admitting: Radiation Oncology

## 2023-12-12 ENCOUNTER — Ambulatory Visit
Admission: RE | Admit: 2023-12-12 | Discharge: 2023-12-12 | Disposition: A | Discharge status: Home / Self Care | Source: Ambulatory Visit | Attending: Radiation Oncology | Admitting: Radiation Oncology

## 2023-12-12 ENCOUNTER — Other Ambulatory Visit: Payer: Self-pay

## 2023-12-12 DIAGNOSIS — Z510 Encounter for antineoplastic radiation therapy: Secondary | ICD-10-CM | POA: Diagnosis not present

## 2023-12-12 DIAGNOSIS — Z171 Estrogen receptor negative status [ER-]: Secondary | ICD-10-CM | POA: Diagnosis not present

## 2023-12-12 DIAGNOSIS — D0511 Intraductal carcinoma in situ of right breast: Secondary | ICD-10-CM | POA: Diagnosis not present

## 2023-12-12 LAB — RAD ONC ARIA SESSION SUMMARY
Course Elapsed Days: 16
Plan Fractions Treated to Date: 12
Plan Prescribed Dose Per Fraction: 2.66 Gy
Plan Total Fractions Prescribed: 16
Plan Total Prescribed Dose: 42.56 Gy
Reference Point Dosage Given to Date: 31.92 Gy
Reference Point Session Dosage Given: 2.66 Gy
Session Number: 12

## 2023-12-15 ENCOUNTER — Ambulatory Visit
Admission: RE | Admit: 2023-12-15 | Discharge: 2023-12-15 | Disposition: A | Discharge status: Home / Self Care | Source: Ambulatory Visit | Attending: Radiation Oncology

## 2023-12-15 ENCOUNTER — Other Ambulatory Visit: Payer: Self-pay

## 2023-12-15 DIAGNOSIS — Z171 Estrogen receptor negative status [ER-]: Secondary | ICD-10-CM | POA: Diagnosis not present

## 2023-12-15 DIAGNOSIS — Z510 Encounter for antineoplastic radiation therapy: Secondary | ICD-10-CM | POA: Diagnosis not present

## 2023-12-15 DIAGNOSIS — D0511 Intraductal carcinoma in situ of right breast: Secondary | ICD-10-CM | POA: Diagnosis not present

## 2023-12-15 LAB — RAD ONC ARIA SESSION SUMMARY
Course Elapsed Days: 19
Plan Fractions Treated to Date: 13
Plan Prescribed Dose Per Fraction: 2.66 Gy
Plan Total Fractions Prescribed: 16
Plan Total Prescribed Dose: 42.56 Gy
Reference Point Dosage Given to Date: 34.58 Gy
Reference Point Session Dosage Given: 2.66 Gy
Session Number: 13

## 2023-12-16 ENCOUNTER — Other Ambulatory Visit: Payer: Self-pay

## 2023-12-16 ENCOUNTER — Ambulatory Visit
Admission: RE | Admit: 2023-12-16 | Discharge: 2023-12-16 | Disposition: A | Discharge status: Home / Self Care | Source: Ambulatory Visit | Attending: Radiation Oncology | Admitting: Radiation Oncology

## 2023-12-16 DIAGNOSIS — Z171 Estrogen receptor negative status [ER-]: Secondary | ICD-10-CM | POA: Diagnosis not present

## 2023-12-16 DIAGNOSIS — Z510 Encounter for antineoplastic radiation therapy: Secondary | ICD-10-CM | POA: Diagnosis not present

## 2023-12-16 DIAGNOSIS — D0511 Intraductal carcinoma in situ of right breast: Secondary | ICD-10-CM | POA: Diagnosis not present

## 2023-12-16 LAB — RAD ONC ARIA SESSION SUMMARY
Course Elapsed Days: 20
Plan Fractions Treated to Date: 14
Plan Prescribed Dose Per Fraction: 2.66 Gy
Plan Total Fractions Prescribed: 16
Plan Total Prescribed Dose: 42.56 Gy
Reference Point Dosage Given to Date: 37.24 Gy
Reference Point Session Dosage Given: 2.66 Gy
Session Number: 14

## 2023-12-17 ENCOUNTER — Ambulatory Visit

## 2023-12-17 ENCOUNTER — Ambulatory Visit
Admission: RE | Admit: 2023-12-17 | Discharge: 2023-12-17 | Disposition: A | Discharge status: Home / Self Care | Source: Ambulatory Visit | Attending: Radiation Oncology | Admitting: Radiation Oncology

## 2023-12-17 ENCOUNTER — Other Ambulatory Visit: Payer: Self-pay

## 2023-12-17 DIAGNOSIS — D0511 Intraductal carcinoma in situ of right breast: Secondary | ICD-10-CM | POA: Diagnosis not present

## 2023-12-17 DIAGNOSIS — Z510 Encounter for antineoplastic radiation therapy: Secondary | ICD-10-CM | POA: Diagnosis not present

## 2023-12-17 DIAGNOSIS — Z171 Estrogen receptor negative status [ER-]: Secondary | ICD-10-CM | POA: Diagnosis not present

## 2023-12-17 LAB — RAD ONC ARIA SESSION SUMMARY
Course Elapsed Days: 21
Plan Fractions Treated to Date: 15
Plan Prescribed Dose Per Fraction: 2.66 Gy
Plan Total Fractions Prescribed: 16
Plan Total Prescribed Dose: 42.56 Gy
Reference Point Dosage Given to Date: 39.9 Gy
Reference Point Session Dosage Given: 2.66 Gy
Session Number: 15

## 2023-12-18 ENCOUNTER — Other Ambulatory Visit: Payer: Self-pay

## 2023-12-18 ENCOUNTER — Ambulatory Visit
Admission: RE | Admit: 2023-12-18 | Discharge: 2023-12-18 | Disposition: A | Discharge status: Home / Self Care | Source: Ambulatory Visit | Attending: Radiation Oncology

## 2023-12-18 DIAGNOSIS — D0511 Intraductal carcinoma in situ of right breast: Secondary | ICD-10-CM | POA: Diagnosis not present

## 2023-12-18 DIAGNOSIS — Z510 Encounter for antineoplastic radiation therapy: Secondary | ICD-10-CM | POA: Diagnosis not present

## 2023-12-18 DIAGNOSIS — Z171 Estrogen receptor negative status [ER-]: Secondary | ICD-10-CM | POA: Diagnosis not present

## 2023-12-18 LAB — RAD ONC ARIA SESSION SUMMARY
Course Elapsed Days: 22
Plan Fractions Treated to Date: 16
Plan Prescribed Dose Per Fraction: 2.66 Gy
Plan Total Fractions Prescribed: 16
Plan Total Prescribed Dose: 42.56 Gy
Reference Point Dosage Given to Date: 42.56 Gy
Reference Point Session Dosage Given: 2.66 Gy
Session Number: 16

## 2023-12-19 ENCOUNTER — Ambulatory Visit
Admission: RE | Admit: 2023-12-19 | Discharge: 2023-12-19 | Disposition: A | Discharge status: Home / Self Care | Source: Ambulatory Visit | Attending: Radiation Oncology | Admitting: Radiation Oncology

## 2023-12-19 ENCOUNTER — Ambulatory Visit

## 2023-12-19 ENCOUNTER — Other Ambulatory Visit: Payer: Self-pay

## 2023-12-19 ENCOUNTER — Ambulatory Visit: Admitting: Radiation Oncology

## 2023-12-19 DIAGNOSIS — Z510 Encounter for antineoplastic radiation therapy: Secondary | ICD-10-CM | POA: Diagnosis not present

## 2023-12-19 DIAGNOSIS — D0511 Intraductal carcinoma in situ of right breast: Secondary | ICD-10-CM | POA: Diagnosis not present

## 2023-12-19 DIAGNOSIS — Z171 Estrogen receptor negative status [ER-]: Secondary | ICD-10-CM | POA: Diagnosis not present

## 2023-12-19 LAB — RAD ONC ARIA SESSION SUMMARY
Course Elapsed Days: 23
Plan Fractions Treated to Date: 1
Plan Prescribed Dose Per Fraction: 2 Gy
Plan Total Fractions Prescribed: 4
Plan Total Prescribed Dose: 8 Gy
Reference Point Dosage Given to Date: 2 Gy
Reference Point Session Dosage Given: 2 Gy
Session Number: 17

## 2023-12-22 ENCOUNTER — Other Ambulatory Visit: Payer: Self-pay

## 2023-12-22 ENCOUNTER — Ambulatory Visit
Admission: RE | Admit: 2023-12-22 | Discharge: 2023-12-22 | Disposition: A | Discharge status: Home / Self Care | Source: Ambulatory Visit | Attending: Radiation Oncology | Admitting: Radiation Oncology

## 2023-12-22 DIAGNOSIS — Z510 Encounter for antineoplastic radiation therapy: Secondary | ICD-10-CM | POA: Diagnosis not present

## 2023-12-22 DIAGNOSIS — D0511 Intraductal carcinoma in situ of right breast: Secondary | ICD-10-CM | POA: Diagnosis not present

## 2023-12-22 LAB — RAD ONC ARIA SESSION SUMMARY
Course Elapsed Days: 26
Plan Fractions Treated to Date: 2
Plan Prescribed Dose Per Fraction: 2 Gy
Plan Total Fractions Prescribed: 4
Plan Total Prescribed Dose: 8 Gy
Reference Point Dosage Given to Date: 4 Gy
Reference Point Session Dosage Given: 2 Gy
Session Number: 18

## 2023-12-23 ENCOUNTER — Ambulatory Visit
Admission: RE | Admit: 2023-12-23 | Discharge: 2023-12-23 | Disposition: A | Discharge status: Home / Self Care | Source: Ambulatory Visit | Attending: Radiation Oncology

## 2023-12-23 ENCOUNTER — Other Ambulatory Visit: Payer: Self-pay

## 2023-12-23 DIAGNOSIS — Z510 Encounter for antineoplastic radiation therapy: Secondary | ICD-10-CM | POA: Diagnosis not present

## 2023-12-23 DIAGNOSIS — D0511 Intraductal carcinoma in situ of right breast: Secondary | ICD-10-CM | POA: Diagnosis not present

## 2023-12-23 LAB — RAD ONC ARIA SESSION SUMMARY
Course Elapsed Days: 27
Plan Fractions Treated to Date: 3
Plan Prescribed Dose Per Fraction: 2 Gy
Plan Total Fractions Prescribed: 4
Plan Total Prescribed Dose: 8 Gy
Reference Point Dosage Given to Date: 6 Gy
Reference Point Session Dosage Given: 2 Gy
Session Number: 19

## 2023-12-24 ENCOUNTER — Ambulatory Visit

## 2023-12-24 ENCOUNTER — Other Ambulatory Visit: Payer: Self-pay

## 2023-12-24 ENCOUNTER — Ambulatory Visit
Admission: RE | Admit: 2023-12-24 | Discharge: 2023-12-24 | Disposition: A | Discharge status: Home / Self Care | Source: Ambulatory Visit | Attending: Radiation Oncology | Admitting: Radiation Oncology

## 2023-12-24 DIAGNOSIS — Z171 Estrogen receptor negative status [ER-]: Secondary | ICD-10-CM | POA: Diagnosis not present

## 2023-12-24 DIAGNOSIS — D0511 Intraductal carcinoma in situ of right breast: Secondary | ICD-10-CM | POA: Diagnosis not present

## 2023-12-24 DIAGNOSIS — Z510 Encounter for antineoplastic radiation therapy: Secondary | ICD-10-CM | POA: Diagnosis not present

## 2023-12-24 LAB — RAD ONC ARIA SESSION SUMMARY
Course Elapsed Days: 28
Plan Fractions Treated to Date: 4
Plan Prescribed Dose Per Fraction: 2 Gy
Plan Total Fractions Prescribed: 4
Plan Total Prescribed Dose: 8 Gy
Reference Point Dosage Given to Date: 8 Gy
Reference Point Session Dosage Given: 2 Gy
Session Number: 20

## 2023-12-25 ENCOUNTER — Ambulatory Visit

## 2023-12-25 NOTE — Radiation Completion Notes (Addendum)
  Radiation Oncology         [id number]) [phone] ________________________________  Name: Toni [name] MRN: [id number]  Date of Service: 12/24/2023  DOB: 1946-05-01  End of Treatment Note  Diagnosis:  pT1micNxMx, unknown grade or prognostics following an initial diagnosis of high grade, ER/PR negative DCIS of the right breast.   Intent: Curative     ==========DELIVERED PLANS==========  First Treatment Date: 2023-11-26 Last Treatment Date: 2023-12-24   Plan Name: Breast_R Site: Breast, Right Technique: 3D Mode: Photon Dose Per Fraction: 2.66 Gy Prescribed Dose (Delivered / Prescribed): 42.56 Gy / 42.56 Gy Prescribed Fxs (Delivered / Prescribed): 16 / 16   Plan Name: Breast_R_Bst Site: Breast, Right Technique: 3D Mode: Photon Dose Per Fraction: 2 Gy Prescribed Dose (Delivered / Prescribed): 8 Gy / 8 Gy Prescribed Fxs (Delivered / Prescribed): 4 / 4     ==========ON TREATMENT VISIT DATES========== 2023-11-27, 2023-12-05, 2023-12-12, 2023-12-19    See weekly On Treatment Notes in Epic for details in the Media tab (listed as Progress notes on the On Treatment Visit Dates listed above). The patient tolerated radiation. She developed fatigue and anticipated skin changes in the treatment field.   The patient will receive a call in about one month from the radiation oncology department. She will continue follow up with Dr. Gudena as well.      Donald KYM Husband, PAC

## 2023-12-26 ENCOUNTER — Ambulatory Visit

## 2023-12-29 ENCOUNTER — Ambulatory Visit

## 2023-12-31 ENCOUNTER — Ambulatory Visit: Admitting: Hematology and Oncology

## 2024-01-04 NOTE — Assessment & Plan Note (Signed)
 09/12/2023:Screening mammogram detected left breast calcifications UOQ 0.7 cm stereotactic biopsy: High-grade DCIS with calcifications necrosis ER 0%, PR 0%   10/17/23: Left Lumpectomy: 2 foci of microinvasive cancer largest 0.5 cm 11/27/23- 12/24/23: Adj XRT  RTC in 3 months for SCP visit

## 2024-01-05 ENCOUNTER — Inpatient Hospital Stay: Attending: Hematology and Oncology | Admitting: Hematology and Oncology

## 2024-01-05 VITALS — BP 124/76 | HR 83 | Temp 98.0°F | Resp 18 | Ht 62.0 in | Wt 174.9 lb

## 2024-01-05 DIAGNOSIS — D0511 Intraductal carcinoma in situ of right breast: Secondary | ICD-10-CM | POA: Insufficient documentation

## 2024-01-05 DIAGNOSIS — Z1722 Progesterone receptor negative status: Secondary | ICD-10-CM | POA: Diagnosis not present

## 2024-01-05 DIAGNOSIS — Z171 Estrogen receptor negative status [ER-]: Secondary | ICD-10-CM | POA: Diagnosis not present

## 2024-01-05 DIAGNOSIS — Z923 Personal history of irradiation: Secondary | ICD-10-CM | POA: Diagnosis not present

## 2024-01-05 NOTE — Progress Notes (Signed)
 Patient Care Team: Geofm Glade PARAS, MD as PCP - General (Internal Medicine) Fernande Elspeth BROCKS, MD as PCP - Electrophysiology (Cardiology) Abigail Maude POUR as Consulting Physician (Optometry) Pyrtle, Gordy HERO, MD as Consulting Physician (Gastroenterology) West, Mabel PARAS, MD as Consulting Physician (Gastroenterology) Georgia Blonder, MD as Consulting Physician (Obstetrics and Gynecology) Claudene Arthea HERO, DO as Consulting Physician (Family Medicine) Tyree Nanetta SAILOR, RN as Oncology Nurse Navigator Glean, Stephane BROCKS, RN (Inactive) as Oncology Nurse Navigator Curvin Deward MOULD, MD as Consulting Physician (General Surgery) Odean Potts, MD as Consulting Physician (Hematology and Oncology) Dewey Rush, MD as Consulting Physician (Radiation Oncology)  DIAGNOSIS:  Encounter Diagnosis  Name Primary?   Ductal carcinoma in situ (DCIS) of right breast Yes    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%, PR 0%   09/24/2023 Cancer Staging   Staging form: Breast, AJCC 8th Edition - Clinical: Stage 0 (cTis (DCIS), cN0, cM0, G3, ER-, PR-, HER2: Not Assessed) - Signed by Odean Potts, MD on 09/24/2023 Stage prefix: Initial diagnosis Histologic grading system: 3 grade system   10/06/2023 Genetic Testing   Negative [facility] CancerNext-Expanded +RNAinsight Panel.  Report date is 10/06/2023.   The CancerNext-Expanded gene panel offered by [facility] and includes sequencing, rearrangement, and RNA analysis for the following 77 genes: AIP, ALK, APC, ATM, AXIN2, BAP1, BARD1, BMPR1A, BRCA1, BRCA2, BRIP1, CDC73, CDH1, CDK4, CDKN1B, CDKN2A, CEBPA, CHEK2, CTNNA1, DDX41, DICER1, ETV6, FH, FLCN, GATA2, LZTR1, MAX, MBD4, MEN1, MET, MLH1, MSH2, MSH3, MSH6, MUTYH, NF1, NF2, NTHL1, PALB2, PHOX2B, PMS2,  POT1, PRKAR1A, PTCH1, PTEN, RAD51C, RAD51D, RB1, RET, RSP20, RUNX1, SDHA, SDHAF2, SDHB, SDHC, SDHD, SMAD4, SMARCA4, SMARCB1, SMARCE1, STK11, SUFU, TMEM127, TP53, TSC1, TSC2, VHL, and WT1 (sequencing and deletion/duplication); EGFR, HOXB13, KIT, MITF, PDGFRA, POLD1, and POLE (sequencing only); EPCAM and GREM1 (deletion/duplication only).    11/10/2023 Cancer Staging   Staging form: Breast, AJCC 8th Edition - Pathologic stage from 11/10/2023: Stage Unknown (pT49mi, pNX, cM0, GX, ER: Unknown, PR: Unknown, HER2: Not Assessed) - Signed by Lanell Donald Stagger, PA-C on 11/10/2023 Stage prefix: Initial diagnosis Histologic grading system: 3 grade system     CHIEF COMPLIANT: Follow-up to discuss treatment plan after completion of radiation  HISTORY OF PRESENT ILLNESS: Toni [name] is 78 year old who had just completed adjuvant radiation for DCIS.  She is here to discuss her adjuvant treatment plan.  She is healing and recovering very well from the radiation.  Since she was estrogen progesterone negative there is no role of antiestrogen therapy.  Because there was microinvasive disease on her final pathology we discussed about monitoring more closely with guardant reveal.    ALLERGIES:  is allergic to dexilant [dexlansoprazole], prevacid [lansoprazole], and prilosec [omeprazole].  MEDICATIONS:  Current Outpatient Medications  Medication Sig Dispense Refill   amLODipine  (NORVASC ) 2.5 MG tablet Take 1 tablet (2.5 mg total) by mouth daily. 90 tablet 3   Cholecalciferol (VITAMIN D) 50 MCG (2000 UT) CAPS Take by mouth.     pantoprazole  (PROTONIX ) 40 MG tablet Take 40 mg by mouth daily.     No current facility-administered medications for this visit.    PHYSICAL EXAMINATION: ECOG PERFORMANCE STATUS: 1 - Symptomatic but completely ambulatory  Vitals:   01/05/24 0947  BP: 124/76  Pulse: 83  Resp: 18  Temp: 98 F (36.7 C)  SpO2: 100%  Filed Weights   01/05/24 0947  Weight: 174 lb 14.4 oz (79.3 kg)       LABORATORY DATA:  I have reviewed the data as listed    Latest Ref Rng & Units 09/24/2023   12:33 PM 03/04/2023    3:40 PM 08/17/2021   10:23 AM  CMP  Glucose 70 - 99 mg/dL 880  897  88   BUN 8 - 23 mg/dL 17  21  13    Creatinine 0.44 - 1.00 mg/dL 9.27  9.11  9.34   Sodium 135 - 145 mmol/L 141  140  143   Potassium 3.5 - 5.1 mmol/L 3.8  4.6  3.7   Chloride 98 - 111 mmol/L 109  103  108   CO2 22 - 32 mmol/L 26  31  30    Calcium 8.9 - 10.3 mg/dL 8.8  89.9  8.8   Total Protein 6.5 - 8.1 g/dL 6.6  7.0  6.7   Total Bilirubin 0.0 - 1.2 mg/dL 0.5  0.6  0.4   Alkaline Phos 38 - 126 U/L 79  95  74   AST 15 - 41 U/L 11  13  14    ALT 0 - 44 U/L 15  10  10      Lab Results  Component Value Date   WBC 8.6 09/24/2023   HGB 13.5 09/24/2023   HCT 39.6 09/24/2023   MCV 84.3 09/24/2023   PLT 257 09/24/2023   NEUTROABS 6.0 09/24/2023    ASSESSMENT & PLAN:  Ductal carcinoma in situ (DCIS) of right breast 09/12/2023:Screening mammogram detected left breast calcifications UOQ 0.7 cm stereotactic biopsy: High-grade DCIS with calcifications necrosis ER 0%, PR 0%   10/17/23: Left Lumpectomy: 2 foci of microinvasive cancer largest 0.5 cm 11/27/23- 12/24/23: Adj XRT  No role of antiestrogen therapy since she is ER/PR negative. Because there was microinvasive cancer, I recommended that we obtain guardant reveal for MRD testing.  RTC in 3 months for SCP visit   No orders of the defined types were placed in this encounter.  The patient has a good understanding of the overall plan. she agrees with it. she will call with any problems that may develop before the next visit here. Total time spent: 30 mins including face to face time and time spent for planning, charting and co-ordination of care   Toni MARLA Chad, MD 01/05/24

## 2024-01-08 ENCOUNTER — Telehealth: Payer: Self-pay

## 2024-01-08 NOTE — Telephone Encounter (Signed)
 Per md orders entered for Guardant Reveal and all supported documents faxed to [phone]. Faxed confirmation was received.

## 2024-01-19 ENCOUNTER — Ambulatory Visit
Admission: RE | Admit: 2024-01-19 | Discharge: 2024-01-19 | Disposition: A | Discharge status: Home / Self Care | Source: Ambulatory Visit | Attending: Adult Health | Admitting: Adult Health

## 2024-01-19 ENCOUNTER — Telehealth: Payer: Self-pay | Admitting: *Deleted

## 2024-01-19 DIAGNOSIS — D0511 Intraductal carcinoma in situ of right breast: Secondary | ICD-10-CM

## 2024-01-19 NOTE — Progress Notes (Signed)
  Radiation Oncology         [id number]) [phone] ________________________________  Name: Toni [name] MRN: [id number]  Date of Service: 01/19/2024  DOB: 07-Sep-1945  Post Treatment Telephone Note  Diagnosis:pT1micNxMx, unknown grade or prognostics following an initial diagnosis of high grade, ER/PR negative DCIS of the right breast.     The patient was available for call today.   Symptoms of fatigue have not improved since completing therapy.  Symptoms of skin changes have improved since completing therapy.  The patient was encouraged to avoid sun exposure in the area of prior treatment for up to one year following radiation with either sunscreen or by the style of clothing worn in the sun.  The patient has scheduled follow up with her medical oncologist Dr. Gudena for ongoing surveillance, and was encouraged to call if she develops concerns or questions regarding radiation.

## 2024-01-19 NOTE — Telephone Encounter (Signed)
 Per radiation nurse, pt will be out of town on 09/06-09/22 and she will not be available for call with Gardant lab results. Advised that pt can call when she returns to f/u.

## 2024-01-28 DIAGNOSIS — M1711 Unilateral primary osteoarthritis, right knee: Secondary | ICD-10-CM | POA: Diagnosis not present

## 2024-01-28 DIAGNOSIS — M170 Bilateral primary osteoarthritis of knee: Secondary | ICD-10-CM | POA: Diagnosis not present

## 2024-01-28 DIAGNOSIS — M1712 Unilateral primary osteoarthritis, left knee: Secondary | ICD-10-CM | POA: Diagnosis not present

## 2024-01-30 ENCOUNTER — Encounter: Payer: Self-pay | Admitting: Hematology and Oncology

## 2024-03-29 ENCOUNTER — Encounter: Admitting: Adult Health

## 2024-04-02 ENCOUNTER — Telehealth: Payer: Self-pay | Admitting: Adult Health

## 2024-04-02 NOTE — Telephone Encounter (Signed)
 left vm for pt about rescheduled appt date and time. Encourged to call back if need to re-reschedule

## 2024-04-13 ENCOUNTER — Inpatient Hospital Stay: Admitting: Adult Health

## 2024-04-13 DIAGNOSIS — D3131 Benign neoplasm of right choroid: Secondary | ICD-10-CM | POA: Diagnosis not present

## 2024-05-10 ENCOUNTER — Telehealth: Payer: Self-pay | Admitting: Cardiology

## 2024-05-10 NOTE — Telephone Encounter (Signed)
° °*  STAT* If patient is at the pharmacy, call can be transferred to refill team.   1. Which medications need to be refilled? (please list name of each medication and dose if known)   amLODipine  (NORVASC ) 2.5 MG tablet   2. Which pharmacy/location (including street and city if local pharmacy) is medication to be sent to?  [id number] - [place], [place] - [address]    3. Do they need a 30 day or 90 day supply? 90  Patient has appt with Cindie on 05/31/24

## 2024-05-11 MED ORDER — AMLODIPINE BESYLATE 2.5 MG PO TABS: 2.5000 mg | ORAL_TABLET | Freq: Every day | ORAL | 0 refills | Status: DC

## 2024-05-11 NOTE — Telephone Encounter (Signed)
 Pt scheduled to see Dr. Cindie 05/31/24, refill sent.

## 2024-05-24 ENCOUNTER — Inpatient Hospital Stay: Admitting: Adult Health

## 2024-05-29 NOTE — Progress Notes (Unsigned)
" °  Electrophysiology Office Follow up Visit Note:    Date:  05/31/2024   ID:  Toni [name], DOB July 18, 1945, MRN [id number]  PCP:  Geofm Glade PARAS, MD  [facility] Cardiologist:  None  [facility] Electrophysiologist:  Elspeth Sage, MD (Inactive)    Interval History:     Toni [name] is a 79 y.o. female who presents for a follow up visit.   She was last seen by Dr Sage 04/23/2023. She has a history of HCM in her family and mild LVH on her own echo. Also a history of HTN. She is with her husband today.  She reports elevated blood pressures at home.  At the dentist recently her blood pressure was in the 150s.  She does not routinely check it but does have a cuff available.  No arrhythmia complaints today.      Past medical, surgical, social and family history were reviewed.  ROS:   Please see the history of present illness.    All other systems reviewed and are negative.  EKGs/Labs/Other Studies Reviewed:    The following studies were reviewed today:          Physical Exam:    VS:  BP (!) 148/70 (BP Location: Left Arm, Patient Position: Sitting, Cuff Size: Normal)   Pulse 79   Ht 5' 2 (1.575 m)   Wt 170 lb (77.1 kg)   LMP 05/27/2006   BMI 31.09 kg/m     Wt Readings from Last 3 Encounters:  05/31/24 170 lb (77.1 kg)  01/05/24 174 lb 14.4 oz (79.3 kg)  10/17/23 172 lb 6.4 oz (78.2 kg)     GEN: no distress CARD: RRR, No MRG RESP: No IWOB. CTAB.      ASSESSMENT:    1. Primary hypertension    PLAN:    In order of problems listed above:  #Hypertension Above goal today.  Recommend checking blood pressures 1-2 times per week at home and recording the values.  Recommend bringing these recordings to the primary care physician. Increase amlodipine  to 5 mg by mouth once daily.  She is to check her blood pressures at home.  If they are consistently greater than 140, she is to reach out for us  to increase antihypertensive regimen further.  I will  get her established with general cardiology for further blood pressure control.  I discussed my upcoming departure from [name] during today's clinic appointment.  The patient will continue to follow-up with one of my EP partners moving forward.  Follow-up 1 year with general cardiology     Signed, Ole Holts, MD, [facility], [facility] 05/31/2024 10:04 AM    Electrophysiology [facility] "

## 2024-05-31 ENCOUNTER — Ambulatory Visit: Attending: Cardiology | Admitting: Cardiology

## 2024-05-31 ENCOUNTER — Encounter: Payer: Self-pay | Admitting: Cardiology

## 2024-05-31 VITALS — BP 148/70 | HR 79 | Ht 62.0 in | Wt 170.0 lb

## 2024-05-31 DIAGNOSIS — I10 Essential (primary) hypertension: Secondary | ICD-10-CM | POA: Diagnosis not present

## 2024-05-31 MED ORDER — AMLODIPINE BESYLATE 5 MG PO TABS: 5.0000 mg | ORAL_TABLET | Freq: Every day | ORAL | 3 refills | Status: AC

## 2024-05-31 NOTE — Patient Instructions (Signed)
 Medication Instructions:  Your physician has recommended you make the following change in your medication:  1) INCREASE amlodipine  to 5 mg once daily   *If you need a refill on your cardiac medications before your next appointment, please call your pharmacy*  Follow-Up: At [facility], you and your health needs are our priority.  As part of our continuing mission to provide you with exceptional heart care, our providers are all part of one team.  This team includes your primary Cardiologist (physician) and Advanced Practice Providers or APPs (Physician Assistants and Nurse Practitioners) who all work together to provide you with the care you need, when you need it.  Your next appointment:   1 year  Provider:   General Cardiology

## 2024-06-07 ENCOUNTER — Encounter: Payer: Self-pay | Admitting: *Deleted

## 2024-06-08 ENCOUNTER — Inpatient Hospital Stay: Attending: Adult Health | Admitting: Adult Health

## 2024-06-08 ENCOUNTER — Telehealth: Payer: Self-pay

## 2024-06-08 ENCOUNTER — Encounter: Payer: Self-pay | Admitting: Adult Health

## 2024-06-08 ENCOUNTER — Other Ambulatory Visit: Payer: Self-pay

## 2024-06-08 VITALS — BP 130/51 | HR 76 | Temp 98.6°F | Resp 16 | Ht 62.0 in | Wt 170.1 lb

## 2024-06-08 DIAGNOSIS — E785 Hyperlipidemia, unspecified: Secondary | ICD-10-CM | POA: Insufficient documentation

## 2024-06-08 DIAGNOSIS — M810 Age-related osteoporosis without current pathological fracture: Secondary | ICD-10-CM

## 2024-06-08 DIAGNOSIS — D0511 Intraductal carcinoma in situ of right breast: Secondary | ICD-10-CM | POA: Insufficient documentation

## 2024-06-08 DIAGNOSIS — K5730 Diverticulosis of large intestine without perforation or abscess without bleeding: Secondary | ICD-10-CM | POA: Insufficient documentation

## 2024-06-08 DIAGNOSIS — Z8041 Family history of malignant neoplasm of ovary: Secondary | ICD-10-CM | POA: Insufficient documentation

## 2024-06-08 DIAGNOSIS — Z860100 Personal history of colon polyps, unspecified: Secondary | ICD-10-CM | POA: Insufficient documentation

## 2024-06-08 DIAGNOSIS — I10 Essential (primary) hypertension: Secondary | ICD-10-CM | POA: Insufficient documentation

## 2024-06-08 DIAGNOSIS — M8580 Other specified disorders of bone density and structure, unspecified site: Secondary | ICD-10-CM | POA: Insufficient documentation

## 2024-06-08 DIAGNOSIS — Z79899 Other long term (current) drug therapy: Secondary | ICD-10-CM | POA: Diagnosis not present

## 2024-06-08 NOTE — Telephone Encounter (Signed)
 Looks like we need this to be ordered.  Copied from CRM [id number]. Topic: Appointments - Scheduling Inquiry for Clinic >> Jun 08, 2024  3:46 PM Toni [name] wrote: Reason for CRM: Patient is calling to schedule a bone density exam. Patient states she won't be able to come until February 18th and February 19th only afternoon.

## 2024-06-08 NOTE — Progress Notes (Signed)
 SURVIVORSHIP VISIT:  BRIEF ONCOLOGIC HISTORY:  Oncology History  Ductal carcinoma in situ (DCIS) of right breast  09/12/2023 Initial Diagnosis   Screening mammogram detected left breast calcifications UOQ 0.7 cm stereotactic biopsy: High-grade DCIS with calcifications necrosis ER 0%, PR 0%   09/24/2023 Cancer Staging   Staging form: Breast, AJCC 8th Edition - Clinical: Stage 0 (cTis (DCIS), cN0, cM0, G3, ER-, PR-, HER2: Not Assessed) - Signed by Odean Potts, MD on 09/24/2023 Stage prefix: Initial diagnosis Histologic grading system: 3 grade system   10/06/2023 Genetic Testing   Negative [facility] CancerNext-Expanded +RNAinsight Panel.  Report date is 10/06/2023.   The CancerNext-Expanded gene panel offered by [facility] and includes sequencing, rearrangement, and RNA analysis for the following 77 genes: AIP, ALK, APC, ATM, AXIN2, BAP1, BARD1, BMPR1A, BRCA1, BRCA2, BRIP1, CDC73, CDH1, CDK4, CDKN1B, CDKN2A, CEBPA, CHEK2, CTNNA1, DDX41, DICER1, ETV6, FH, FLCN, GATA2, LZTR1, MAX, MBD4, MEN1, MET, MLH1, MSH2, MSH3, MSH6, MUTYH, NF1, NF2, NTHL1, PALB2, PHOX2B, PMS2, POT1, PRKAR1A, PTCH1, PTEN, RAD51C, RAD51D, RB1, RET, RSP20, RUNX1, SDHA, SDHAF2, SDHB, SDHC, SDHD, SMAD4, SMARCA4, SMARCB1, SMARCE1, STK11, SUFU, TMEM127, TP53, TSC1, TSC2, VHL, and WT1 (sequencing and deletion/duplication); EGFR, HOXB13, KIT, MITF, PDGFRA, POLD1, and POLE (sequencing only); EPCAM and GREM1 (deletion/duplication only).    10/17/2023 Surgery   RIGHT BREAST LUMPECTOMY: DCIS; ER/PR-; no lymph nodes submitted; margins negative   11/10/2023 Cancer Staging   Staging form: Breast, AJCC 8th Edition - Pathologic stage from 11/10/2023: Stage Unknown (pT5mi, pNX, cM0, GX, ER: Unknown, PR: Unknown, HER2: Not Assessed) - Signed by Lanell Donald Stagger, PA-C on 11/10/2023 Stage prefix: Initial diagnosis Histologic grading system: 3 grade system   11/26/2023 - 12/24/2023 Radiation Therapy   Plan Name: Breast_R Site: Breast,  Right Technique: 3D Mode: Photon Dose Per Fraction: 2.66 Gy Prescribed Dose (Delivered / Prescribed): 42.56 Gy / 42.56 Gy Prescribed Fxs (Delivered / Prescribed): 16 / 16   Plan Name: Breast_R_Bst Site: Breast, Right Technique: 3D Mode: Photon Dose Per Fraction: 2 Gy Prescribed Dose (Delivered / Prescribed): 8 Gy / 8 Gy Prescribed Fxs (Delivered / Prescribed): 4 / 4       INTERVAL HISTORY:  Discussed the use of AI scribe software for clinical note transcription with the patient, who gave verbal consent to proceed.  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  She underwent lumpectomy without lymph node removal followed by adjuvant radiation. She completed genetic testing in August 2025 and participates in serial [facility] blood testing, with the last in August 2025 and the next planned for February 2026.  She denies rash, skin changes, breast swelling, lymphedema, chronic pain, or seroma after radiation. She has mild intermittent pain at the lumpectomy site that is improving over time. She is current with annual diagnostic mammograms, with the next due March 2026.  She is due for a bone density scan. She remains adherent to colonoscopy and biennial endoscopy for esophageal precancerous changes, with the next endoscopy planned for May 2026. She has not seen dermatology for skin cancer screening for about two to three years and plans to follow up after travel.  She received a recent influenza vaccine and prior shingles and pneumococcal vaccines and is considering the newer shingles series. She asks about safe alcohol consumption after breast cancer. She stays physically active as tolerated, limited  somewhat by knee osteoarthritis, and  plans to resume swimming soon.    REVIEW OF SYSTEMS:  Review of Systems  Constitutional:  Negative for appetite change, chills, fatigue, fever and unexpected weight change.  HENT:   Negative for hearing loss, lump/mass and trouble swallowing.   Eyes:  Negative for eye problems and icterus.  Respiratory:  Negative for chest tightness, cough and shortness of breath.   Cardiovascular:  Negative for chest pain, leg swelling and palpitations.  Gastrointestinal:  Negative for abdominal distention, abdominal pain, constipation, diarrhea, nausea and vomiting.  Endocrine: Negative for hot flashes.  Genitourinary:  Negative for difficulty urinating.   Musculoskeletal:  Negative for arthralgias.  Skin:  Negative for itching and rash.  Neurological:  Negative for dizziness, extremity weakness, headaches and numbness.  Hematological:  Negative for adenopathy. Does not bruise/bleed easily.  Psychiatric/Behavioral:  Negative for depression. The patient is not nervous/anxious.   Breast: Denies any new nodularity, masses, tenderness, nipple changes, or nipple discharge.       PAST MEDICAL/SURGICAL HISTORY:  Past Medical History:  Diagnosis Date   Aortic sclerosis 2000   on ECHO   Arthritis    knees   Breast cancer (HCC)    Colon polyps    Dr Jakie   Diverticulosis of colon (without mention of hemorrhage)    Esophageal reflux    Eye abnormality    OD ocular freckle   H/O hiatal hernia    Hyperlipidemia 1999   LDL 158, resolved with diet   Hypertension    Osteopenia after menopause 2024   osteopenia with positive frax. LFN -2.3, frax 4.3% PMD managing considering medication   Premature ventricular contractions    Stricture and stenosis of esophagus    dilation X 3   SVT (supraventricular tachycardia)    slow pathway ablated   Past Surgical History:  Procedure Laterality Date   ANKLE SURGERY     right   BREAST BIOPSY Right 09/12/2023   MM RT BREAST BX W LOC DEV 1ST LESION IMAGE  BX SPEC STEREO GUIDE 09/12/2023 [facility]   BREAST BIOPSY  10/14/2023   MM RT RADIOACTIVE SEED LOC MAMMO GUIDE 10/14/2023 [facility]   BREAST LUMPECTOMY WITH RADIOACTIVE SEED LOCALIZATION Right 10/17/2023   Procedure: BREAST LUMPECTOMY WITH RADIOACTIVE SEED LOCALIZATION;  Surgeon: Curvin Deward MOULD, MD;  Location: [facility];  Service: General;  Laterality: Right;  RIGHT BREAST RADIOACTIVE SEED LOCALIZED LUMPECTOMY   COLONOSCOPY  10/14/2014   pyrtle   COLONOSCOPY W/ POLYPECTOMY  2011   Dr Jakie   KNEE ARTHROSCOPY     bilateral   ovarian wedge resection     to allow pregnancy   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   SUPRAVENTRICULAR TACHYCARDIA ABLATION N/A 06/08/2013   Procedure: SUPRAVENTRICULAR TACHYCARDIA ABLATION;  Surgeon: Lynwood JONETTA Rakers, MD;  Location: [facility];  Service: Cardiovascular;  Laterality: N/A;   TONSILLECTOMY     UPPER GASTROINTESTINAL ENDOSCOPY     [facility] yearly   wisdom teeth ext       ALLERGIES:  Allergies[1]   CURRENT MEDICATIONS:  Outpatient Encounter Medications as of 06/08/2024  Medication Sig   amLODipine  (NORVASC ) 5 MG tablet Take 1 tablet (5 mg total) by mouth daily.   Cholecalciferol (VITAMIN D) 50 MCG (2000 UT) CAPS Take by mouth.   pantoprazole (PROTONIX) 40 MG tablet Take 40 mg by mouth daily.   No facility-administered encounter medications on file as of 06/08/2024.     ONCOLOGIC FAMILY HISTORY:  Family History  Problem Relation Age of Onset   Arthritis Mother        OA   Hypertension Mother    Hypothyroidism Mother    Stroke Mother 19       ischemic   Stroke Father 5   Heart attack Sister 45       smoker   Ovarian cancer Sister        dx 44s   Osteopenia Sister    Hypertrophic cardiomyopathy Sister    COPD Sister    Heart failure Sister    Hypothyroidism Sister    Cardiomyopathy Sister    Emphysema Sister    Diabetes Maternal Grandmother    Colon cancer Neg Hx    Esophageal cancer  Neg Hx    Liver disease Neg Hx    Stomach cancer Neg Hx    Rectal cancer Neg Hx      SOCIAL HISTORY:  Social History   Socioeconomic History   Marital status: Married    Spouse name: Micheal   Number of children: 2   Years of education: Not on file   Highest education level: Some college, no degree  Occupational History   Occupation: Producer, Television/film/video: [employer]   Occupation: RETIRED  Tobacco Use   Smoking status: Never   Smokeless tobacco: Never  Vaping Use   Vaping status: Never Used  Substance and Sexual Activity   Alcohol use: Not Currently    Comment: rare   Drug use: No   Sexual activity: Yes    Partners: Male    Birth control/protection: Post-menopausal    Comment: older than 16, less than 5  Other Topics Concern   Not on file  Social History Narrative   Lives with husband   Social Drivers of Health   Tobacco Use: Low Risk (06/08/2024)   Patient History    Smoking Tobacco Use: Never    Smokeless Tobacco Use: Never    Passive Exposure: Not on file  Financial Resource Strain: Low Risk (09/10/2023)   Overall Financial Resource Strain (CARDIA)    Difficulty of Paying Living Expenses: Not hard at all  Food Insecurity: No Food Insecurity (09/24/2023)   Hunger Vital Sign    Worried About Running Out of Food in the Last Year: Never true    Ran Out of Food in the Last Year: Never true  Transportation Needs: No Transportation Needs (09/10/2023)   PRAPARE - Administrator, Civil Service (Medical): No    Lack of Transportation (Non-Medical): No  Physical Activity: Insufficiently Active (09/10/2023)   Exercise Vital Sign    Days of Exercise per Week: 3 days    Minutes of Exercise per Session: 20 min  Stress: No Stress Concern Present (09/10/2023)   [employer] of Occupational Health - Occupational Stress Questionnaire    Feeling of Stress : Not at all  Social Connections: Unknown (09/10/2023)   Social Connection and Isolation Panel     Frequency of Communication with Friends and Family: Once a week    Frequency of Social Gatherings with Friends and Family: Patient declined    Attends Religious Services: More than 4 times per year    Active Member of [employer] or Organizations: No    Attends Banker Meetings: Not on file    Marital Status: Married  Intimate Partner Violence: Not At Risk (09/24/2023)   Humiliation, Afraid, Rape, and Kick questionnaire    Fear of Current or Ex-Partner: No  Emotionally Abused: No    Physically Abused: No    Sexually Abused: No  Depression (PHQ2-9): Low Risk (06/08/2024)   Depression (PHQ2-9)    PHQ-2 Score: 0  Alcohol Screen: Low Risk (09/10/2023)   Alcohol Screen    Last Alcohol Screening Score (AUDIT): 1  Housing: Unknown (11/05/2023)   Received [facility]   Epic    Unable to Pay for Housing in the Last Year: Not on file    Number of Times Moved in the Last Year: Not on file    At any time in the past 12 months, were you homeless or living in a shelter (including now)?: No  Utilities: Not At Risk (09/24/2023)   [facility] Utilities    Threatened with loss of utilities: No  Health Literacy: Adequate Health Literacy (09/11/2023)   B1300 Health Literacy    Frequency of need for help with medical instructions: Never     OBSERVATIONS/OBJECTIVE:  BP (!) 130/51 (BP Location: Left Arm, Patient Position: Sitting, Cuff Size: Normal)   Pulse 76   Temp 98.6 F (37 C) (Oral)   Resp 16   Ht 5' 2 (1.575 m)   Wt 170 lb 1.6 oz (77.2 kg)   LMP 05/27/2006   SpO2 97%   BMI 31.11 kg/m  GENERAL: Patient is a well appearing female in no acute distress HEENT:  Sclerae anicteric.  Oropharynx clear and moist. No ulcerations or evidence of oropharyngeal candidiasis. Neck is supple.  NODES:  No cervical, supraclavicular, or axillary lymphadenopathy palpated.  BREAST EXAM:  Right breast s/p lumpectomy and radiation, no sign of local recurrence; left breast benign LUNGS:   Clear to auscultation bilaterally.  No wheezes or rhonchi. HEART:  Regular rate and rhythm. No murmur appreciated. ABDOMEN:  Soft, nontender.  Positive, normoactive bowel sounds. No organomegaly palpated. MSK:  No focal spinal tenderness to palpation. Full range of motion bilaterally in the upper extremities. EXTREMITIES:  No peripheral edema.   SKIN:  Clear with no obvious rashes or skin changes. No nail dyscrasia. NEURO:  Nonfocal. Well oriented.  Appropriate affect.   LABORATORY DATA:  None for this visit.  DIAGNOSTIC IMAGING:  None for this visit.      ASSESSMENT AND PLAN:  Ms.. [name] is a pleasant 79 y.o. female with Stage IA right breast invasive ductal carcinoma, ER-/PR-, diagnosed in 08/2023, treated with lumpectomy and adjuvant radiation therapy.  She presents to the Survivorship Clinic for our initial meeting and routine follow-up post-completion of treatment for breast cancer.    1. Stage IA right breast cancer:  Toni [name] is continuing to recover from definitive treatment for breast cancer. She will follow-up with her medical oncologist, Dr. Odean in 6 months with history and physical exam per surveillance protocol.  She will undergo guardant reveal testing every 6 months.  Next due in February. Her mammogram is due 07/2024; orders placed today.   Today, a comprehensive survivorship care plan and treatment summary was reviewed with the patient today detailing her breast cancer diagnosis, treatment course, potential late/long-term effects of treatment, appropriate follow-up care with recommendations for the future, and patient education resources.  A copy of this summary, along with a letter will be sent to the patients primary care provider via mail/fax/In Basket message after todays visit.    2. Bone health:  She undergoes bone density testing with Dr. Geofm, and plans to f/u with her about repeat testing in the spring of this year.  She was given education  on specific  activities to promote bone health.  3. Cancer screening:  Due to Toni [name]'s history and her age, she should receive screening for skin cancers, colon cancer, and gynecologic cancers.  The information and recommendations are listed on the patient's comprehensive care plan/treatment summary and were reviewed in detail with the patient.    4. Health maintenance and wellness promotion: Toni [name] was encouraged to consume 5-7 servings of fruits and vegetables per day. We reviewed the Nutrition Rainbow handout.  She was also encouraged to engage in moderate to vigorous exercise for 30 minutes per day most days of the week.  She was instructed to limit her alcohol consumption and continue to abstain from tobacco use.     5. Support services/counseling: It is not uncommon for this period of the patient's cancer care trajectory to be one of many emotions and stressors.   She was given information regarding our available services and encouraged to contact me with any questions or for help enrolling in any of our support group/programs.    Follow up instructions:    -Return to cancer center in 6 months for f/u with Dr. Odean  -Mammogram due in 07/2024 -Guardant Reveal testing every February and August -Will see Dr. Curvin every January.   -She is welcome to return back to the Survivorship Clinic at any time; no additional follow-up needed at this time.  -Consider referral back to survivorship as a long-term survivor for continued surveillance  The patient was provided an opportunity to ask questions and all were answered. The patient agreed with the plan and demonstrated an understanding of the instructions.   Total encounter time:40 minutes*in face-to-face visit time, chart review, lab review, care coordination, order entry, and documentation of the encounter time.    Morna Kendall, NP 06/08/2024 1:14 PM Medical Oncology and Hematology [facility] [address]  [place], [place] [address] Tel. [phone]    Fax. [phone]  *Total Encounter Time as defined by the Centers for Medicare and Medicaid Services includes, in addition to the face-to-face time of a patient visit (documented in the note above) non-face-to-face time: obtaining and reviewing outside history, ordering and reviewing medications, tests or procedures, care coordination (communications with other health care professionals or caregivers) and documentation in the medical record.     [1]  Allergies Allergen Reactions   Dexilant [Dexlansoprazole]     REACTION: pressure in ears   Prevacid [Lansoprazole]     REACTION: pressure in ears   Prilosec [Omeprazole]     REACTION: pressure in ears

## 2024-07-14 ENCOUNTER — Other Ambulatory Visit

## 2024-09-07 ENCOUNTER — Encounter

## 2024-09-16 ENCOUNTER — Ambulatory Visit

## 2024-12-06 ENCOUNTER — Inpatient Hospital Stay: Admitting: Hematology and Oncology
